# Patient Record
Sex: Female | Born: 1935 | Race: White | Hispanic: No | Smoking: Former smoker
Health system: Southern US, Community
[De-identification: ages and names within clinical notes are randomized; demographics above are authoritative.]

## PROBLEM LIST (undated history)

## (undated) DIAGNOSIS — I6529 Occlusion and stenosis of unspecified carotid artery: Secondary | ICD-10-CM

## (undated) DIAGNOSIS — F0390 Unspecified dementia without behavioral disturbance: Secondary | ICD-10-CM

## (undated) DIAGNOSIS — IMO0002 Reserved for concepts with insufficient information to code with codable children: Secondary | ICD-10-CM

## (undated) DIAGNOSIS — I219 Acute myocardial infarction, unspecified: Secondary | ICD-10-CM

## (undated) DIAGNOSIS — M549 Dorsalgia, unspecified: Secondary | ICD-10-CM

## (undated) DIAGNOSIS — I350 Nonrheumatic aortic (valve) stenosis: Secondary | ICD-10-CM

## (undated) DIAGNOSIS — K311 Adult hypertrophic pyloric stenosis: Secondary | ICD-10-CM

## (undated) DIAGNOSIS — N189 Chronic kidney disease, unspecified: Secondary | ICD-10-CM

## (undated) DIAGNOSIS — N289 Disorder of kidney and ureter, unspecified: Secondary | ICD-10-CM

## (undated) DIAGNOSIS — J449 Chronic obstructive pulmonary disease, unspecified: Secondary | ICD-10-CM

## (undated) DIAGNOSIS — I701 Atherosclerosis of renal artery: Secondary | ICD-10-CM

## (undated) DIAGNOSIS — E785 Hyperlipidemia, unspecified: Secondary | ICD-10-CM

## (undated) DIAGNOSIS — D649 Anemia, unspecified: Secondary | ICD-10-CM

## (undated) DIAGNOSIS — I10 Essential (primary) hypertension: Secondary | ICD-10-CM

## (undated) DIAGNOSIS — I2510 Atherosclerotic heart disease of native coronary artery without angina pectoris: Secondary | ICD-10-CM

## (undated) DIAGNOSIS — K449 Diaphragmatic hernia without obstruction or gangrene: Secondary | ICD-10-CM

## (undated) DIAGNOSIS — E039 Hypothyroidism, unspecified: Secondary | ICD-10-CM

## (undated) DIAGNOSIS — K635 Polyp of colon: Secondary | ICD-10-CM

## (undated) HISTORY — DX: Dorsalgia, unspecified: M54.9

## (undated) HISTORY — DX: Atherosclerotic heart disease of native coronary artery without angina pectoris: I25.10

## (undated) HISTORY — PX: CHOLECYSTECTOMY: SHX55

## (undated) HISTORY — DX: Diaphragmatic hernia without obstruction or gangrene: K44.9

## (undated) HISTORY — DX: Occlusion and stenosis of unspecified carotid artery: I65.29

## (undated) HISTORY — DX: Adult hypertrophic pyloric stenosis: K31.1

## (undated) HISTORY — DX: Chronic obstructive pulmonary disease, unspecified: J44.9

---

## 1998-11-11 ENCOUNTER — Other Ambulatory Visit: Admission: RE | Admit: 1998-11-11 | Discharge: 1998-11-11 | Payer: Self-pay | Admitting: Family Medicine

## 1999-02-14 ENCOUNTER — Ambulatory Visit (HOSPITAL_COMMUNITY): Admission: RE | Admit: 1999-02-14 | Discharge: 1999-02-14 | Payer: Self-pay | Admitting: Family Medicine

## 1999-02-14 ENCOUNTER — Encounter: Payer: Self-pay | Admitting: Family Medicine

## 1999-02-24 ENCOUNTER — Encounter: Payer: Self-pay | Admitting: Family Medicine

## 1999-02-24 ENCOUNTER — Ambulatory Visit (HOSPITAL_COMMUNITY): Admission: RE | Admit: 1999-02-24 | Discharge: 1999-02-24 | Payer: Self-pay | Admitting: Family Medicine

## 1999-08-30 ENCOUNTER — Encounter: Admission: RE | Admit: 1999-08-30 | Discharge: 1999-08-30 | Payer: Self-pay | Admitting: Family Medicine

## 1999-08-30 ENCOUNTER — Encounter: Payer: Self-pay | Admitting: Family Medicine

## 1999-11-15 ENCOUNTER — Other Ambulatory Visit: Admission: RE | Admit: 1999-11-15 | Discharge: 1999-11-15 | Payer: Self-pay | Admitting: Family Medicine

## 2000-06-18 HISTORY — PX: CORONARY ARTERY BYPASS GRAFT: SHX141

## 2000-09-27 ENCOUNTER — Encounter: Payer: Self-pay | Admitting: Family Medicine

## 2000-09-27 ENCOUNTER — Encounter: Admission: RE | Admit: 2000-09-27 | Discharge: 2000-09-27 | Payer: Self-pay | Admitting: Family Medicine

## 2000-12-27 ENCOUNTER — Ambulatory Visit: Admission: RE | Admit: 2000-12-27 | Discharge: 2000-12-27 | Payer: Self-pay | Admitting: Internal Medicine

## 2001-01-04 ENCOUNTER — Encounter: Payer: Self-pay | Admitting: Emergency Medicine

## 2001-01-04 ENCOUNTER — Encounter: Payer: Self-pay | Admitting: Family Medicine

## 2001-01-04 ENCOUNTER — Inpatient Hospital Stay (HOSPITAL_COMMUNITY): Admission: EM | Admit: 2001-01-04 | Discharge: 2001-01-14 | Payer: Self-pay | Admitting: Emergency Medicine

## 2001-01-06 ENCOUNTER — Encounter: Payer: Self-pay | Admitting: Internal Medicine

## 2001-01-08 ENCOUNTER — Encounter: Payer: Self-pay | Admitting: *Deleted

## 2001-01-09 ENCOUNTER — Encounter: Payer: Self-pay | Admitting: Thoracic Surgery (Cardiothoracic Vascular Surgery)

## 2001-01-10 ENCOUNTER — Encounter: Payer: Self-pay | Admitting: Thoracic Surgery (Cardiothoracic Vascular Surgery)

## 2001-01-11 ENCOUNTER — Encounter: Payer: Self-pay | Admitting: Thoracic Surgery (Cardiothoracic Vascular Surgery)

## 2001-09-17 ENCOUNTER — Encounter: Admission: RE | Admit: 2001-09-17 | Discharge: 2001-09-17 | Payer: Self-pay | Admitting: Family Medicine

## 2001-09-17 ENCOUNTER — Encounter: Payer: Self-pay | Admitting: Family Medicine

## 2001-11-14 ENCOUNTER — Other Ambulatory Visit: Admission: RE | Admit: 2001-11-14 | Discharge: 2001-11-14 | Payer: Self-pay | Admitting: Family Medicine

## 2002-08-21 ENCOUNTER — Encounter: Payer: Self-pay | Admitting: Cardiology

## 2002-08-21 ENCOUNTER — Ambulatory Visit (HOSPITAL_COMMUNITY): Admission: RE | Admit: 2002-08-21 | Discharge: 2002-08-21 | Payer: Self-pay | Admitting: Cardiology

## 2002-10-13 ENCOUNTER — Encounter: Payer: Self-pay | Admitting: Family Medicine

## 2002-10-13 ENCOUNTER — Encounter: Admission: RE | Admit: 2002-10-13 | Discharge: 2002-10-13 | Payer: Self-pay | Admitting: Family Medicine

## 2003-10-14 ENCOUNTER — Encounter: Admission: RE | Admit: 2003-10-14 | Discharge: 2003-10-14 | Payer: Self-pay | Admitting: Family Medicine

## 2004-05-03 ENCOUNTER — Ambulatory Visit: Payer: Self-pay | Admitting: Family Medicine

## 2004-05-18 ENCOUNTER — Encounter: Admission: RE | Admit: 2004-05-18 | Discharge: 2004-05-18 | Payer: Self-pay | Admitting: Family Medicine

## 2004-05-29 ENCOUNTER — Ambulatory Visit: Payer: Self-pay | Admitting: Gastroenterology

## 2004-06-05 ENCOUNTER — Ambulatory Visit: Payer: Self-pay | Admitting: Gastroenterology

## 2004-06-05 ENCOUNTER — Ambulatory Visit (HOSPITAL_COMMUNITY): Admission: RE | Admit: 2004-06-05 | Discharge: 2004-06-05 | Payer: Self-pay | Admitting: Gastroenterology

## 2004-07-03 ENCOUNTER — Ambulatory Visit: Payer: Self-pay | Admitting: Gastroenterology

## 2004-07-11 ENCOUNTER — Ambulatory Visit (HOSPITAL_COMMUNITY): Admission: RE | Admit: 2004-07-11 | Discharge: 2004-07-11 | Payer: Self-pay | Admitting: Gastroenterology

## 2004-07-11 ENCOUNTER — Ambulatory Visit: Payer: Self-pay | Admitting: Gastroenterology

## 2004-07-12 ENCOUNTER — Ambulatory Visit (HOSPITAL_COMMUNITY): Admission: RE | Admit: 2004-07-12 | Discharge: 2004-07-12 | Payer: Self-pay | Admitting: Gastroenterology

## 2004-08-09 ENCOUNTER — Ambulatory Visit: Payer: Self-pay | Admitting: Gastroenterology

## 2004-11-01 ENCOUNTER — Encounter: Admission: RE | Admit: 2004-11-01 | Discharge: 2004-11-01 | Payer: Self-pay | Admitting: Family Medicine

## 2004-12-21 ENCOUNTER — Encounter: Admission: RE | Admit: 2004-12-21 | Discharge: 2004-12-21 | Payer: Self-pay | Admitting: Family Medicine

## 2004-12-21 ENCOUNTER — Ambulatory Visit: Payer: Self-pay | Admitting: Family Medicine

## 2005-01-01 ENCOUNTER — Ambulatory Visit (HOSPITAL_COMMUNITY): Admission: RE | Admit: 2005-01-01 | Discharge: 2005-01-02 | Payer: Self-pay | Admitting: Family Medicine

## 2005-01-05 ENCOUNTER — Ambulatory Visit: Payer: Self-pay | Admitting: Family Medicine

## 2005-01-26 ENCOUNTER — Ambulatory Visit: Payer: Self-pay | Admitting: Family Medicine

## 2005-03-08 ENCOUNTER — Ambulatory Visit: Payer: Self-pay | Admitting: Family Medicine

## 2005-04-21 ENCOUNTER — Ambulatory Visit: Payer: Self-pay | Admitting: Family Medicine

## 2005-07-30 ENCOUNTER — Ambulatory Visit: Payer: Self-pay | Admitting: Family Medicine

## 2005-08-27 ENCOUNTER — Ambulatory Visit (HOSPITAL_COMMUNITY): Admission: RE | Admit: 2005-08-27 | Discharge: 2005-08-27 | Payer: Self-pay | Admitting: Family Medicine

## 2005-08-30 ENCOUNTER — Ambulatory Visit: Payer: Self-pay | Admitting: Family Medicine

## 2005-09-12 ENCOUNTER — Encounter: Admission: RE | Admit: 2005-09-12 | Discharge: 2005-09-12 | Payer: Self-pay | Admitting: Interventional Radiology

## 2005-09-27 ENCOUNTER — Ambulatory Visit: Payer: Self-pay | Admitting: Family Medicine

## 2005-10-11 ENCOUNTER — Ambulatory Visit: Payer: Self-pay | Admitting: Family Medicine

## 2005-11-05 ENCOUNTER — Encounter: Admission: RE | Admit: 2005-11-05 | Discharge: 2005-11-05 | Payer: Self-pay | Admitting: Family Medicine

## 2006-01-08 ENCOUNTER — Ambulatory Visit: Payer: Self-pay | Admitting: Family Medicine

## 2006-02-11 ENCOUNTER — Ambulatory Visit: Payer: Self-pay | Admitting: Family Medicine

## 2006-04-12 ENCOUNTER — Ambulatory Visit: Payer: Self-pay | Admitting: Family Medicine

## 2006-06-27 ENCOUNTER — Ambulatory Visit: Payer: Self-pay | Admitting: Family Medicine

## 2006-07-08 ENCOUNTER — Ambulatory Visit: Payer: Self-pay | Admitting: Family Medicine

## 2006-07-08 LAB — CONVERTED CEMR LAB
BUN: 19 mg/dL (ref 6–23)
Potassium: 4.4 meq/L (ref 3.5–5.1)
Sodium: 145 meq/L (ref 135–145)

## 2006-07-22 ENCOUNTER — Ambulatory Visit: Payer: Self-pay | Admitting: Family Medicine

## 2006-08-21 ENCOUNTER — Ambulatory Visit: Payer: Self-pay | Admitting: Family Medicine

## 2006-10-02 ENCOUNTER — Ambulatory Visit: Payer: Self-pay | Admitting: Family Medicine

## 2006-12-03 ENCOUNTER — Encounter: Admission: RE | Admit: 2006-12-03 | Discharge: 2006-12-03 | Payer: Self-pay | Admitting: Family Medicine

## 2006-12-12 ENCOUNTER — Encounter: Payer: Self-pay | Admitting: Family Medicine

## 2006-12-12 DIAGNOSIS — J45909 Unspecified asthma, uncomplicated: Secondary | ICD-10-CM | POA: Insufficient documentation

## 2006-12-12 DIAGNOSIS — K219 Gastro-esophageal reflux disease without esophagitis: Secondary | ICD-10-CM

## 2006-12-12 DIAGNOSIS — J4489 Other specified chronic obstructive pulmonary disease: Secondary | ICD-10-CM | POA: Insufficient documentation

## 2006-12-12 DIAGNOSIS — J449 Chronic obstructive pulmonary disease, unspecified: Secondary | ICD-10-CM

## 2006-12-12 DIAGNOSIS — E785 Hyperlipidemia, unspecified: Secondary | ICD-10-CM

## 2006-12-12 DIAGNOSIS — I10 Essential (primary) hypertension: Secondary | ICD-10-CM

## 2006-12-12 DIAGNOSIS — I2510 Atherosclerotic heart disease of native coronary artery without angina pectoris: Secondary | ICD-10-CM | POA: Insufficient documentation

## 2006-12-12 DIAGNOSIS — E039 Hypothyroidism, unspecified: Secondary | ICD-10-CM

## 2006-12-23 ENCOUNTER — Telehealth: Payer: Self-pay | Admitting: Family Medicine

## 2007-01-15 ENCOUNTER — Ambulatory Visit: Payer: Self-pay | Admitting: Family Medicine

## 2007-01-15 DIAGNOSIS — M818 Other osteoporosis without current pathological fracture: Secondary | ICD-10-CM | POA: Insufficient documentation

## 2007-01-15 LAB — CONVERTED CEMR LAB
ALT: 16 units/L (ref 0–35)
AST: 22 units/L (ref 0–37)
Basophils Relative: 0.5 % (ref 0.0–1.0)
Bilirubin, Direct: 0.1 mg/dL (ref 0.0–0.3)
CO2: 30 meq/L (ref 19–32)
Calcium: 10.2 mg/dL (ref 8.4–10.5)
Chloride: 101 meq/L (ref 96–112)
Creatinine, Ser: 1.8 mg/dL — ABNORMAL HIGH (ref 0.4–1.2)
Direct LDL: 141.5 mg/dL
Eosinophils Absolute: 0.1 10*3/uL (ref 0.0–0.6)
Eosinophils Relative: 2.3 % (ref 0.0–5.0)
Glucose, Bld: 104 mg/dL — ABNORMAL HIGH (ref 70–99)
HCT: 40.6 % (ref 36.0–46.0)
HDL: 54.5 mg/dL (ref >39.0)
MCV: 87.9 fL (ref 78.0–100.0)
Neutrophils Relative %: 67.1 % (ref 43.0–77.0)
Platelets: 202 10*3/uL (ref 150–400)
RBC: 4.62 M/uL (ref 3.87–5.11)
Sodium: 140 meq/L (ref 135–145)
Total Bilirubin: 1 mg/dL (ref 0.3–1.2)
Total Protein: 7.1 g/dL (ref 6.0–8.3)
Triglycerides: 93 mg/dL (ref 0–149)
VLDL: 19 mg/dL (ref 0–40)
WBC: 5.8 10*3/uL (ref 4.5–10.5)

## 2007-04-18 ENCOUNTER — Ambulatory Visit: Payer: Self-pay | Admitting: Family Medicine

## 2007-12-04 ENCOUNTER — Encounter: Admission: RE | Admit: 2007-12-04 | Discharge: 2007-12-04 | Payer: Self-pay | Admitting: Family Medicine

## 2008-01-14 ENCOUNTER — Telehealth: Payer: Self-pay | Admitting: Family Medicine

## 2008-01-19 ENCOUNTER — Ambulatory Visit (HOSPITAL_COMMUNITY): Admission: RE | Admit: 2008-01-19 | Discharge: 2008-01-19 | Payer: Self-pay | Admitting: Family Medicine

## 2008-01-19 ENCOUNTER — Ambulatory Visit: Payer: Self-pay | Admitting: Family Medicine

## 2008-01-19 LAB — CONVERTED CEMR LAB
ALT: 13 units/L (ref 0–35)
AST: 19 units/L (ref 0–37)
Basophils Absolute: 0 10*3/uL (ref 0.0–0.1)
Basophils Relative: 0.8 % (ref 0.0–3.0)
Bilirubin Urine: NEGATIVE
Bilirubin, Direct: 0.1 mg/dL (ref 0.0–0.3)
CO2: 30 meq/L (ref 19–32)
Chloride: 107 meq/L (ref 96–112)
Cholesterol: 191 mg/dL (ref 0–200)
Glucose, Bld: 93 mg/dL (ref 70–99)
Hemoglobin: 12.6 g/dL (ref 12.0–15.0)
LDL Cholesterol: 119 mg/dL — ABNORMAL HIGH (ref 0–99)
Lymphocytes Relative: 27.2 % (ref 12.0–46.0)
MCHC: 34.9 g/dL (ref 30.0–36.0)
Monocytes Relative: 9.2 % (ref 3.0–12.0)
Neutrophils Relative %: 56.9 % (ref 43.0–77.0)
Nitrite: NEGATIVE
Protein, U semiquant: NEGATIVE
RBC: 4.24 M/uL (ref 3.87–5.11)
RDW: 13.5 % (ref 11.5–14.6)
Sodium: 142 meq/L (ref 135–145)
Total Bilirubin: 0.8 mg/dL (ref 0.3–1.2)
Total CHOL/HDL Ratio: 4
Total Protein: 6.7 g/dL (ref 6.0–8.3)
Urobilinogen, UA: 0.2
VLDL: 25 mg/dL (ref 0–40)

## 2008-04-01 ENCOUNTER — Telehealth: Payer: Self-pay | Admitting: Family Medicine

## 2008-12-06 ENCOUNTER — Encounter: Admission: RE | Admit: 2008-12-06 | Discharge: 2008-12-06 | Payer: Self-pay | Admitting: Family Medicine

## 2009-05-17 ENCOUNTER — Telehealth: Payer: Self-pay | Admitting: Family Medicine

## 2009-06-18 HISTORY — PX: CAROTID ENDARTERECTOMY: SUR193

## 2009-09-02 ENCOUNTER — Observation Stay (HOSPITAL_COMMUNITY): Admission: EM | Admit: 2009-09-02 | Discharge: 2009-09-06 | Payer: Self-pay | Admitting: Anesthesiology

## 2009-09-04 ENCOUNTER — Ambulatory Visit: Payer: Self-pay | Admitting: Internal Medicine

## 2009-09-05 ENCOUNTER — Ambulatory Visit: Payer: Self-pay | Admitting: Vascular Surgery

## 2009-09-05 ENCOUNTER — Encounter (INDEPENDENT_AMBULATORY_CARE_PROVIDER_SITE_OTHER): Payer: Self-pay | Admitting: Internal Medicine

## 2009-09-06 ENCOUNTER — Encounter (INDEPENDENT_AMBULATORY_CARE_PROVIDER_SITE_OTHER): Payer: Self-pay | Admitting: Internal Medicine

## 2009-09-08 ENCOUNTER — Inpatient Hospital Stay (HOSPITAL_COMMUNITY): Admission: RE | Admit: 2009-09-08 | Discharge: 2009-09-09 | Payer: Self-pay | Admitting: Vascular Surgery

## 2009-09-08 ENCOUNTER — Encounter: Payer: Self-pay | Admitting: Vascular Surgery

## 2009-09-28 ENCOUNTER — Ambulatory Visit: Payer: Self-pay | Admitting: Vascular Surgery

## 2010-09-08 LAB — PROTIME-INR
INR: 0.98 (ref 0.00–1.49)
Prothrombin Time: 12.9 seconds (ref 11.6–15.2)

## 2010-09-08 LAB — BASIC METABOLIC PANEL
BUN: 13 mg/dL (ref 6–23)
BUN: 22 mg/dL (ref 6–23)
CO2: 22 mEq/L (ref 19–32)
CO2: 24 mEq/L (ref 19–32)
CO2: 24 mEq/L (ref 19–32)
CO2: 26 mEq/L (ref 19–32)
Calcium: 8.8 mg/dL (ref 8.4–10.5)
Calcium: 8.9 mg/dL (ref 8.4–10.5)
Chloride: 105 mEq/L (ref 96–112)
Chloride: 105 mEq/L (ref 96–112)
Chloride: 109 mEq/L (ref 96–112)
Creatinine, Ser: 1.09 mg/dL (ref 0.4–1.2)
Creatinine, Ser: 1.37 mg/dL — ABNORMAL HIGH (ref 0.4–1.2)
GFR calc Af Amer: 57 mL/min — ABNORMAL LOW (ref >60)
GFR calc Af Amer: >60 mL/min (ref >60)
GFR calc non Af Amer: 48 mL/min — ABNORMAL LOW (ref >60)
Glucose, Bld: 107 mg/dL — ABNORMAL HIGH (ref 70–99)
Glucose, Bld: 141 mg/dL — ABNORMAL HIGH (ref 70–99)
Glucose, Bld: 87 mg/dL (ref 70–99)
Glucose, Bld: 92 mg/dL (ref 70–99)
Sodium: 138 mEq/L (ref 135–145)
Sodium: 139 mEq/L (ref 135–145)
Sodium: 139 mEq/L (ref 135–145)

## 2010-09-08 LAB — COMPREHENSIVE METABOLIC PANEL
ALT: <8 U/L (ref 0–35)
Albumin: 3.2 g/dL — ABNORMAL LOW (ref 3.5–5.2)
Alkaline Phosphatase: 53 U/L (ref 39–117)
BUN: 14 mg/dL (ref 6–23)
Calcium: 8.8 mg/dL (ref 8.4–10.5)
Potassium: 3.4 mEq/L — ABNORMAL LOW (ref 3.5–5.1)
Sodium: 136 mEq/L (ref 135–145)
Total Protein: 5.9 g/dL — ABNORMAL LOW (ref 6.0–8.3)

## 2010-09-08 LAB — VITAMIN B12: Vitamin B-12: 348 pg/mL (ref 211–911)

## 2010-09-08 LAB — CBC
HCT: 25 % — ABNORMAL LOW (ref 36.0–46.0)
HCT: 30.2 % — ABNORMAL LOW (ref 36.0–46.0)
Hemoglobin: 11.3 g/dL — ABNORMAL LOW (ref 12.0–15.0)
Hemoglobin: 7.9 g/dL — ABNORMAL LOW (ref 12.0–15.0)
Hemoglobin: 9.6 g/dL — ABNORMAL LOW (ref 12.0–15.0)
Hemoglobin: 9.7 g/dL — ABNORMAL LOW (ref 12.0–15.0)
MCHC: 31.2 g/dL (ref 30.0–36.0)
MCHC: 31.8 g/dL (ref 30.0–36.0)
MCHC: 31.9 g/dL (ref 30.0–36.0)
MCHC: 32 g/dL (ref 30.0–36.0)
MCHC: 32.1 g/dL (ref 30.0–36.0)
MCHC: 32.4 g/dL (ref 30.0–36.0)
MCV: 63.6 fL — ABNORMAL LOW (ref 78.0–100.0)
MCV: 69.1 fL — ABNORMAL LOW (ref 78.0–100.0)
MCV: 69.6 fL — ABNORMAL LOW (ref 78.0–100.0)
Platelets: 184 10*3/uL (ref 150–400)
Platelets: 196 10*3/uL (ref 150–400)
Platelets: 247 10*3/uL (ref 150–400)
RBC: 3.59 MIL/uL — ABNORMAL LOW (ref 3.87–5.11)
RBC: 4.38 MIL/uL (ref 3.87–5.11)
RBC: 5.07 MIL/uL (ref 3.87–5.11)
RDW: 20.4 % — ABNORMAL HIGH (ref 11.5–15.5)
RDW: 25.2 % — ABNORMAL HIGH (ref 11.5–15.5)
RDW: 25.4 % — ABNORMAL HIGH (ref 11.5–15.5)
RDW: 26.2 % — ABNORMAL HIGH (ref 11.5–15.5)

## 2010-09-08 LAB — POCT CARDIAC MARKERS: Myoglobin, poc: 89.4 ng/mL (ref 12–200)

## 2010-09-08 LAB — CARDIAC PANEL(CRET KIN+CKTOT+MB+TROPI)
CK, MB: 1.3 ng/mL (ref 0.3–4.0)
Total CK: 104 U/L (ref 7–177)
Total CK: 89 U/L (ref 7–177)
Troponin I: 0.02 ng/mL (ref 0.00–0.06)

## 2010-09-08 LAB — CK TOTAL AND CKMB (NOT AT ARMC)
CK, MB: 1.1 ng/mL (ref 0.3–4.0)
Relative Index: INVALID (ref 0.0–2.5)
Total CK: 94 U/L (ref 7–177)

## 2010-09-08 LAB — URINALYSIS, ROUTINE W REFLEX MICROSCOPIC
Glucose, UA: NEGATIVE mg/dL
Hgb urine dipstick: NEGATIVE
Ketones, ur: NEGATIVE mg/dL
Protein, ur: NEGATIVE mg/dL

## 2010-09-08 LAB — RETICULOCYTES
RBC.: 4.1 MIL/uL (ref 3.87–5.11)
Retic Count, Absolute: 28.7 10*3/uL (ref 19.0–186.0)
Retic Ct Pct: 0.7 % (ref 0.4–3.1)

## 2010-09-08 LAB — CROSSMATCH
ABO/RH(D): O POS
DAT, IgG: NEGATIVE
Donor AG Type: NEGATIVE

## 2010-09-08 LAB — T4, FREE: Free T4: 1.14 ng/dL (ref 0.80–1.80)

## 2010-09-08 LAB — LIPID PANEL
Cholesterol: 155 mg/dL (ref 0–200)
HDL: 64 mg/dL (ref >39)
Total CHOL/HDL Ratio: 2.4 RATIO
Triglycerides: 74 mg/dL (ref <150)

## 2010-09-08 LAB — TYPE AND SCREEN
ABO/RH(D): O POS
Antibody Screen: POSITIVE

## 2010-09-08 LAB — BLOOD GAS, ARTERIAL
Acid-Base Excess: 1.1 mmol/L (ref 0.0–2.0)
O2 Saturation: 97.3 %
TCO2: 25.7 mmol/L (ref 0–100)
pCO2 arterial: 35.1 mmHg (ref 35.0–45.0)
pO2, Arterial: 79.6 mmHg — ABNORMAL LOW (ref 80.0–100.0)

## 2010-09-08 LAB — HEMOGLOBIN A1C
Hgb A1c MFr Bld: 6.2 % — ABNORMAL HIGH (ref 4.6–6.1)
Mean Plasma Glucose: 131 mg/dL

## 2010-09-08 LAB — MAGNESIUM: Magnesium: 2.1 mg/dL (ref 1.5–2.5)

## 2010-09-08 LAB — TSH: TSH: 21.422 u[IU]/mL — ABNORMAL HIGH (ref 0.350–4.500)

## 2010-09-08 LAB — IRON AND TIBC

## 2010-09-08 LAB — URINE MICROSCOPIC-ADD ON

## 2010-09-08 LAB — HEMOCCULT GUIAC POC 1CARD (OFFICE): Fecal Occult Bld: NEGATIVE

## 2010-10-31 NOTE — Assessment & Plan Note (Signed)
OFFICE VISIT   OLUWASEMILORE, PASCUZZI  DOB:  23-May-1929                                       09/28/2009  ZOXWR#:[id number]   Ms. Sedam returns for followup today.  She underwent left carotid  endarterectomy on September 08, 2009.  This required resection of some  redundant portion of the internal carotid artery as well as ligation of  her left internal external carotid artery.  She had an uneventful  postoperative recovery.  She returns for further followup today.  She  denies any symptoms of TIA, amaurosis or stroke.  She states that she is  still undergoing workup for her chronic anemia by Dr. Loreta Ave.   On physical exam today, blood pressure is 134/73 in the right arm,  136/73 in the left arm, heart rate 60 and regular.  Left neck incision  is well-healed.  She has no carotid bruits bilaterally.  Upper extremity  and lower extremity motor strength is 5/5 and symmetric.   Previously she had a moderate 40-60% stenosis of her right carotid  artery.  She will return for further followup for surveillance of this  in 6 months time as well as to reevaluate the left side to make sure she  has no recurrent stenosis.  She will continue to take her aspirin 81 mg  once daily.     Janetta Hora. Fields, MD  Electronically Signed   CEF/MEDQ  D:  09/28/2009  T:  09/29/2009  Job:  [id number]   cc:   Levander Campion, M.D.  Anselmo Rod, M.D.

## 2010-11-03 NOTE — Op Note (Signed)
[place]  Patient:    Wanda Robinson, Wanda Robinson                        MRN: [id number] Proc. Date: 01/09/01 Adm. Date:  [id number] Attending:  Veneda Melter CC:         Lewayne Bunting, M.D.  Evette Georges, M.D. [facility]   Operative Report  PREOPERATIVE DIAGNOSIS:  Severe 3-vessel coronary disease with unstable angina.  POSTOPERATIVE DIAGNOSIS:   Severe 3-vessel coronary disease with unstable angina.  PROCEDURE:  Median sternotomy, extracorporeal circulation, coronary artery bypass grafting x 4 (left internal mammary artery to left anterior descending, saphenous vein graft to ramus intermedius, saphenous vein graft to obtuse marginal-one, saphenous vein graft to posterior descending).  SURGEON:  Salvatore Decent. Dorris Fetch, M.D.  ASSISTANT:  Loura Pardon, P.A.  ANESTHESIA:  General.  FINDINGS:  PDA - poor quality target.  LAD - 1 mm, fair quality target.  Ramus intermedius and OM-1 1.5 mm - thin-walled, fair quality targets.  Good quality conduits.  Left ventricular hypertrophy, inferior scar.  CLINICAL NOTE:  [name] is a 77 year old white female with a history of coronary disease dating back to a myocardial infarction in 1980.  She had had no recent problems with her heart, but presented with unstable angina.  She underwent cardiac catheterization, which revealed severe 3-vessel disease with total occlusion of her circumflex, right coronary and LAD.  She had excellent filling through collaterals primarily supplied by a diseased ramus intermedius.  She also had an 80% left main stenosis.  Her ejection fraction was approximately 50%.  She was referred for coronary artery bypass grafting. The indications, risks, benefits and alternatives were discussed in detail. She understood and accepted the risks and agreed to proceed.  OPERATIVE NOTE:  [name] was brought to the preoperative holding area on January 09, 2001.  Lines were placed to monitor arterial,  central venous  and pulmonary arterial pressure.  EKG leads were placed for continuous telemetry. The patient was taken to the operating room, anesthetized and intubated.  A Foley catheter was placed, intravenous antibiotics were administered.  The chest, abdomen and legs were prepped and draped in the usual fashion.  A median sternotomy was performed.  Simultaneously, an incision was made in the medial aspect of the right leg and the greater saphenous vein was harvested from the ankle to the mid thigh.  The left internal mammary artery was harvested in the standard fashion.  Large branches were doubly clipped and divided.  Small branches were clipped on the mammary side and cauterized on the chest wall side.  The patient was given a full dose of heparin prior to dividing the distal end of the mammary artery.  There was excellent flow through the cut end of the mammary artery.  The mammary was placed in a papaverine soaked sponge and placed into the left pleural space.  The pericardium was opened.  The ascending aorta was inspected and palpated. There was no palpable atherosclerotic disease.  The aorta was cannulated via concentric 2-0 Ethibond pledgeted purse-string sutures.  A dual-stage venous cannula was placed via purse-string suture in the right atrial appendage. Cardiopulmonary bypass was instituted and the patient was cooled to 32 degrees Celcius.  The coronary arteries were inspected and anastomotic sites were chosen.  The conduits were inspected and cut to length.  A foam pad was placed in the pericardium to protect the left phrenic nerve.  A temperature probe  was placed in the myocardial septum and a cardioplegia cannula was placed in the ascending aorta.  The aorta was cross-clamped.  The left ventricle was emptied via the aortic root vent.  Cardiac arrest then was achieved with a combination of cold antegrade blood cardioplegia and topical ice saline; 900 cc of  cardioplegia was administered.  There was a rapid diastolic arrest and myocardial septal temperature of 11 degrees Celcius.  The following distal anastomoses were performed.  First, a reversed saphenous vein graft was placed end-to-side to the posterior descending branch of the distal right coronary.  The posterior descending was a 1 mm, diffusely diseased vessel.  The distal right coronary was heavily calcified and not graftable at that site.  The vein graft was of good quality. The anastomosis was performed with a running 7-0 Prolene suture in an end-to-side fashion.  Next, a reversed saphenous vein graft was placed end-to-side to the first obtuse marginal branch of the left circumflex coronary artery.  This was a posterolateral branch that bifurcated.  The superior limb was the larger of the two limbs and the graft was placed into that.  The circumflex was totally occluded proximal to this vessel.  The vein graft was of good quality.  The artery was fair quality.  It was thin-walled and 1.5 mm in diameter.  The anastomosis was performed with a running 7-0 Prolene suture.  At the completion of the anastomosis, it was probed proximally and distally to ensure patency.  Cardioplegia was administered.  There was good flow and good hemostasis.  Next, a reversed saphenous vein graft was placed end-to-side to the ramus intermedius.  This had an 80% proximal stenosis.  It again was a bifurcating vessel.  The superior limb was the larger of the two branches and a saphenous vein graft was placed end-to-side to this portion of the ramus intermedius with a running 7-0 Prolene suture.  The vein graft was of good quality.  The ramus was 1.5 mm and fair quality as it also was very thin-walled. Cardioplegia then was administered down all three vein grafts.  There was good flow and good hemostasis.  Next, the left internal mammary artery was brought through a window in the  pericardium anterior to  the left phrenic nerve.  The distal end of the mammary was spatulated.  It was a 2 mm, good quality conduit with excellent flow.  It was anastomosed end-to-side to the distal LAD.  The LAD was totally occluded. It did have a diagonal branch, which was less than a mm and too small to graft.  The LAD itself had a 1 mm lumen.  A probe would not pass proximally, but did pass distally to the apex.  The anastomosis was performed end-to-side with a running 8-0 Prolene suture.  At the completion of the mammary to LAD anastomosis, the bulldog clamp was removed from the mammary artery.  Immediate and rapid septal rewarming was noted.  There was a small adventitial leak near the toe, which was repaired with a single 8-0 Prolene suture.  The mammary pedicle was tacked to the epicardial surface of the heart with 6-0 Prolene sutures.  Lidocaine was administered.  There was immediate and rapid septal rewarming.  The aortic cross-clamp was removed.  Total cross-clamp time was 66 minutes.  A partial occlusion clamp was placed on the ascending aorta.  The cardioplegia cannula was removed.  The vein grafts were cut to length and the proximal vein graft anastomoses were performed to 4.0  mm punch aortotomies with a running 6-0 Prolene sutures.  At the completion of the final proximal anastomosis, the patient was placed in Trendelenburg position, air was allowed to vent as the partial clamp was removed.  Air was then aspirated from each of the vein grafts.  The bulldog clamps were removed and flow was restored through all vein grafts.  There was a leak from the toe of the OM-1 proximal, which was repaired with a single 6-0 Prolene suture and a leak from heel of the PDA proximal, which again was repaired with a single 6-0 Prolene suture.  All proximal and distal anastomoses were inspected for hemostasis.  Epicardial pacing wires were placed on the right ventricle and right atrium.   Rewarming had been started  during the mammary to LAD anastomosis and when the core temperature reached 37 degrees Celcius, the patient was weaned from cardiopulmonary bypass without difficulty.  The total bypass time was 131 minutes.  The patient weaned from bypass without inotropic support.  Her initial cardiac indexes were greater than 3 L/min/m.sq.  Of note, she did require 4 units of packed red blood cell transfusion due anemia while on bypass.  A test dose of protamine was administered and was well-tolerated.  The atrial and aortic cannulae were removed.  There was good hemostasis at both cannulation sites.  The remainder of the protamine was administered without incident.  The chest was irrigated with 1 L of warm normal saline containing 1 g of vancomycin.  Hemostasis was achieved.  The pericardium could not be reapproximated.  The mediastinal fat was brought together over the aorta and proximal vein graft anastomoses.  A left pleural and two mediastinal chest tubes were placed through separate subcostal incisions.  The sternum was closed with heavy gauge, interrupted stainless steel wires.  The pectoralis fascia was closed with a running #1 Vicryl suture.  The subcutaneous tissue was closed a running 2-0 Vicryl subcutaneous suture and the skin was closed with a 3-0 Vicryl subcuticular suture.  The leg incision was closed with a 2-0 Vicryl subcutaneous suture and skin staples.  All sponge, needle and instrument counts were correct at the end of the procedure.  The patient remained hemodynamically stable and was taken from the operating room to the surgical intensive care unit intubated and in stable condition. DD:  01/09/01 TD:  01/09/01 Job: [id number]

## 2010-11-03 NOTE — Discharge Summary (Signed)
[place]. [facility]  Patient:    Wanda Robinson, Wanda Robinson                        MRN: [id number] Adm. Date:  [id number] Disc. Date: [id number] Attending:  Charlett Lango Dictator:   Maxwell Marion, RNFA CC:         Lewayne Bunting, M.D.  Evette Georges, M.D. [facility]   Discharge Summary  DATE OF BIRTH:  Sep 04, 1928.  ADMISSION DIAGNOSIS:  Unstable angina.  PAST MEDICAL HISTORY: 1. Coronary artery disease, history of myocardial infarction in 1980,    treated medically. 2. Elevated cholesterol. 3. Hypothyroidism. 4. Hypertension. 5. History of renal artery stenosis treated with percutaneous transluminal    angioplasty and stent September of 2000. 6. Osteoporosis. 7. Transient elevated blood sugar.  ALLERGIES:  This patient is allergic to CODEINE.  DISCHARGE DIAGNOSES: 1. Severe three vessel coronary artery disease with unstable angina, status    post coronary artery bypass grafting. 2. Postoperative bleeding, status post re-exploration of the mediastinum. 3. Postoperative volume overload, resolving.  HOSPITAL COURSE AND PROCEDURES:  Wanda Robinson is a 77 year old white female who was admitted on January 04, 2001, to [facility] in care of [facility] with complaints of substernal chest pain at rest. With her history of myocardial infarction and no follow-up since 1980, it was decided that she should have a cardiac catheterization. This was performed on January 07, 2001, and revealed severe three vessel disease with total occlusion of her circumflex, right coronary, and LAD. She also had an 80% left main stenosis.  Her ejection fraction was approximately 50%.  On January 08, 2001, cardiac surgery consult was obtained with Viviann Spare C. Dorris Fetch, M.D.  He recommended coronary artery bypass grafting for treatment of choice for this woman.  Also on January 08, 2001, she underwent preoperative Doppler studies. This revealed no significant  coronary artery disease and her ankle brachial indexes were noted to be normal bilaterally. On January 09, 2001, she underwent an uncomplicated coronary artery bypass grafting x 4 with Dr. Charlett Lango. Grafts placed at the time of procedure were left internal mammary artery to the left anterior descending artery, saphenous vein was grafted to the ramus intermedius, saphenous vein was grafted to the first obtuse marginal, and saphenous vein was grafted to the posterior descending coronary artery.  She was transferred in stable condition to the SICU.  Wanda Robinson had significant bleeding in the immediate postoperative period.  Her coagulation studies were not very abnormal and because of this, it was decided to bring her back to the operating room for re-exploration of her mediastinum. Findings at the time of surgery were some multiple bleeding sites on the posterior table of the sternum as well as oozing from around the sternal wires but no definitive bleeding sites responsible for large volume.  Next, the internal mammary artery harvest site was also inspected. There were multiple areas where there was capillary oozing but no significant arterial or venous bleeding. The remainder of the chest cavity was examined closely. There was no evidence of bleeding.  The incision was closed and she was again transferred to the SICU in stable condition.  The remainder of Wanda Robinson postoperative course has been uneventful and she is making very good progress in recovery from her surgery.  If she remains stable, it is anticipated she will be ready to be discharged home tomorrow, January 14, 2001.  CONDITION ON  DISCHARGE:  Improved.  DISCHARGE INSTRUCTIONS:  These include medications, activity, diet, wound care and follow-up appointments.  Please see the discharge instruction sheet for details.  MEDICATIONS ON DISCHARGE: 1. Enteric coated aspirin 325 mg p.o. q.d. 2. Darvocet-N 100 one to two p.o.  q.4-6h. p.r.n. for pain. 3. Toprol XL 25 mg p.o. q.d. 4. Lasix 40 mg p.o. q.d. 5. Potassium chloride 20 mEq p.o. q.d. 6. She has also been instructed to resume these home medications:    a. Synthroid 0.125 mg p.o. q.d.    b. Calcium 500 mg p.o. t.i.d.    c. Vitamin D 800 mg p.o. q.d.    d. Protonix 40 mg p.o. q.d.    e. Mevacor 20 mg p.o. q.d.  FOLLOW-UP:  Wanda Robinson has an appointment at the [facility] office on Thursday, January 23, 2001, for staple removal.  She also has an appointment at Lewayne Bunting, M.D.s, office on January 27, 2001, at 3:30 p.m. She will have a chest x-ray taken at that time. She also has an appointment to see Dr. Dorris Fetch back in the [facility] office on Wednesday, February 05, 2001, at 10:30. DD:  01/13/01 TD:  01/14/01 Job: [id number]

## 2010-11-03 NOTE — Cardiovascular Report (Signed)
[place]. [facility]  Patient:    Wanda Robinson, Wanda Robinson                        MRN: [id number] Proc. Date: 01/07/01 Adm. Date:  [id number] Attending:  Veneda Melter CC:         Evette Georges, M.D. [facility]  Lewayne Bunting, M.D.  Cardiac Catheterization Laboratory   Cardiac Catheterization  PROCEDURES PERFORMED:  Left heart catheterization with coronary angiography and left ventriculography.  INDICATIONS:  [name] is a 77 year old woman with a history of coronary artery disease and previous myocardial infarction.  She is admitted to the hospital with chest pain and borderline elevation of her troponin level.  PROCEDURAL NOTE:  A 6-French sheath was placed in the right femoral artery. Standard Judkins 6-French catheters were utilized.  Contrast was Omnipaque. There were no complications.  RESULTS:  HEMODYNAMICS:  Left ventricular pressure 122/20.  Aortic pressure 122/58. There is no aortic valve gradient.  LEFT VENTRICULOGRAM:  There is moderate hypokinesis of the anterior apical wall and interior apical wall with focal akinesis of the apical wall. Ejection fraction is estimated at 50%.  There is no mitral regurgitation.  ABDOMINAL AORTOGRAM:  Abdominal aortogram reveals a patent stent in the left renal artery with less than 20% stenosis within the stent.  There are two right renal arteries, the inferior of which is slender and has a 90% stenosis at its ostium.  The more superior right renal artery is patent.  There is a moderate sized saccular infrarenal abdominal aortic aneurysm.  The iliac arteries have only minor irregularities.  LEFT SUBCLAVIAN ARTERY:  The left subclavian artery has what appears to be a 30% stenosis proximally.  CORONARY ARTERIOGRAPHY (RIGHT DOMINANT): The left main has a distal 80% stenosis just proximal to its bifurcation.  The left anterior descending artery is 100% occluded in the mid vessel just after the first diagonal  branch.  There were left-to-left collaterals filling the mid and distal LAD and a second diagonal branch.  The first diagonal is small to normal in size and has a 99% stenosis at its origin.  The left circumflex has 30% stenosis proximally, followed by a 100% occlusion in the mid vessel.  The distal circumflex, including what appears to be a normal sized branching obtuse marginal, fills by left-to-left collaterals. The circumflex also gives rise to a large branching ramus intermediate which has a 70% stenosis at its ostium.  The right coronary artery is a dominant vessel.  It is 100% occluded proximally just after a large right ventricular branch.  The distal vessel, including the posterior descending artery and posterolateral branch fill by left-to-right collaterals.  IMPRESSIONS: 1. Mildly decreased left ventricular systolic function secondary to ischemic    heart disease. 2. Advanced three-vessel coronary artery disease, including a distal left    main and chronic total occlusions of the mid left anterior descending    artery, mid circumflex, and proximal right coronary artery.  PLAN:  The patient will be referred for coronary artery bypass grafting. Abdominal ultrasound will be obtained to assess the size of her abdominal aortic aneurysm. DD:  01/07/01 TD:  01/08/01 Job: [id number]

## 2010-11-03 NOTE — Procedures (Signed)
[facility]  Patient:    Wanda Robinson, Wanda Robinson                        MRN: [id number] Proc. Date: 12/27/00 Adm. Date:  [id number] Attending:  Avie Echevaria CC:         Evette Georges, M.D. [facility]   Procedure Report  PROCEDURE:  Diagnostic fiberoptic bronchoscopy with foreign body extraction.  PULMONOLOGIST:  Charlaine Dalton. Sherene Sires, M.D. [facility]  HISTORY:  This is a 77 year old white female with a history of smoking, persistent cough, and also history of that she thinks she may have choked on a pill around Christmas time and got it lodged in her throat.  (She pointed in the office to the level of her larynx to describe where she thinks it is stuck.)  She has had refractory cough.  Despite a normal chest x-ray, I felt at this point a bronchoscopy was needed to explore the possibility of endobronchial lesion or foreign body.  She agreed to the procedure after full discussion of risks, benefits, and alternatives in the office.  (Please see dictated office notes.)  There has been no change on her examination.  She continues to have upper airway wheezes that are audible without the stethoscope but not with the stethoscope.  There is no evidence of any localized wheezing on exam.  DESCRIPTION OF BRONCHOSCOPY PROCEDURE:  The procedure was performed in the bronchoscopy suite with continuous monitoring by surface ECG and oximetry. She received a total of 50 mg IV Demerol and 7.5 mg IV Versed for conscious sedation and cough suppression.  The right naris and oropharynx were liberally anesthetized with  1% lidocaine aerosol and 2% lidocaine jelly to the right naris.  Using a standard flexible fiberoptic bronchoscope, the right naris was easily cannulated with good visualization of entire pharynx and larynx.  The cords moved normally, and there were no apparent upper airway lesions.  Using additional 1% lidocaine as needed, the entire tracheobronchial tree was explored  bilaterally with the following findings.  1. The trachea, carina, and all the left-sided airways were normal as was    the right upper lobe. 2. The right-sided airways were markedly abnormal.  There was an obvious white    tablet lodged in the right lower lobe just beyond the takeoff of the right    middle lobe.  There was moderate airway swelling around the pill which    appeared to be intact.  PROCEDURE: Using numerous extractions techniques (direct forceps, brushing, basket, and finally a three-pronged wire, the pill fragment was gradually broken loose and the underlying mucosa inspected.  It appeared quite inflamed, but there were no definite endobronchial lesions seen.  The patient tolerated the procedure well, and followup chest x-ray is pending.  IMPRESSION:  Foreign body extraction completed.  RECOMMENDATIONS:  Follow up as an outpatient.  We will place her on a very short course of prednisone (8 days) for the obvious residual inflammation in the airway. DD:  12/27/00 TD:  12/27/00 Job: [id number]

## 2010-11-03 NOTE — Op Note (Signed)
[place]. [facility]  Patient:    Wanda Robinson, Wanda Robinson                        MRN: [id number] Proc. Date: 01/09/01 Adm. Date:  [id number] Attending:  Charlett Lango CC:         Evette Georges, M.D. [facility]  Lewayne Bunting, M.D.   Operative Report  PREOPERATIVE DIAGNOSIS:  Persistent bleeding, status post coronary artery bypass grafting.  POSTOPERATIVE DIAGNOSIS:  Persistent bleeding, status post coronary artery bypass grafting.  OPERATION PERFORMED:  Re-exploration of mediastinum for bleeding.  SURGEON:  Salvatore Decent. Dorris Fetch, M.D.  ASSISTANT:  Lissa Merlin, P.A.  ANESTHESIA:  General.  OPERATIVE FINDINGS:  Multiple small bleeding sites from chest wall, sternum and mediastinal fat.  All anastomoses intact with no bleeding.  All grafts intact with no bleeding.  Cannulation sites without bleeding.  INDICATIONS FOR PROCEDURE:  The patient is a 77 year old female who on January 09, 2001 underwent coronary artery bypass grafting x 4.  Postoperatively she was taken to the surgical intensive care unit but had persistent bleeding from her mediastinal chest tubes.  With relatively normal coagulation factors and clotting blood in the tubes, and rising pulmonary artery pressures, it was felt that the patient needed re-exploration.  She was not in cardiac tamponade.  The patient was still sedated.  Therefore, consent was obtained from her daughter by phone.  The indications, risks, benefits and alternatives were discussed.  She agreed to proceed to the operating room.  DESCRIPTION OF PROCEDURE:  Ms. Gear was brought from the surgical intensive care unit to the operating room on January 09, 2001.  Intravenous access and invasive monitoring lines were already in place as was the Foley catheter. The patient was already sedated but general anesthesia was induced.  The chest and abdomen and upper legs were prepped and draped in the usual fashion.  The sternotomy incision  was reopened.  The sternal wires were removed.  The sternum was opened and there was significant blood and clot within the mediastinum.  There was no compression of the underlying cardiac structures. There were multiple bleeding sites on the posterior table of the sternum as well as oozing from around the wires but no definitive bleeding site responsible for large volume.  Next the internal mammary artery harvest site was inspected.  Again there were multiple areas where there was capillary oozing but no significant arterial or venous bleeder.  The chest tubes then were evacuated.  All clot was removed from the tubes and they were retracted out of the field.  There was a small amount of blood in both pleural spaces. The pericardial space was inspected.  There was a moderate amount of blood which was bright red with clots. The clots were evacuated.  The proximal anastomosis were inspected as was the aortic cannulation site.  No bleeding was noted.  The atrial cannulation site and pacemaker wire attachment sites were inspected.  All were intact with no evidence of bleeding.  The patient then was placed in Trendelenburg position while closely monitoring blood pressure and heart rate.  The heart was gently elevated to allow inspection of the distal anastomoses.  All were intact without any bleeding.  The vein grafts were inspected along their lengths as was the mammary artery and again no bleeding was noted from any of these sites.  Inspection of the mediastinal fat and pericardial edges revealed multiple areas where there was oozing of bright  red blood again with no definitive bleeder identified.  The mediastinum was packed with 4 x 18 gauzes.  After five minutes the gauzes were removed and there was no significant bleeding on them.  There was continued run-down from the sternum.  Over the next 30 minutes, the chest was carefully inspected and no definitive bleeding site could be identified.   The chest was irrigated with 1L of warm normal saline containing 1 gm of vancomycin.  The chest tubes were replaced into their standard position as the mediastinal fat was reapproximated over the ascending aorta and proximal vein graft anastomoses. The pericardium could not be closed.  The sternum then was closed with heavy gauge interrupted figure-of-eight stainless steel wires.  Careful inspection of the underside of the sternum and chest wall after placement of the wires showed no sites of bleeding.  The pectoralis fascia was closed with a running #1 Vicryl suture.  The subcutaneous tissue was closed with running 2-0 Vicryl suture and the skin was closed with staples.  Sponge, needle and instrument counts were correct at the end of the procedure.  The patient was hemodynamically stable and was transported from the operating room to the surgical intensive care unit in stable condition. DD:  01/10/01 TD:  01/10/01 Job: [id number]

## 2013-03-27 ENCOUNTER — Inpatient Hospital Stay (HOSPITAL_BASED_OUTPATIENT_CLINIC_OR_DEPARTMENT_OTHER)
Admission: EM | Admit: 2013-03-27 | Discharge: 2013-03-29 | DRG: 392 | Disposition: A | Discharge status: Home / Self Care | Payer: Medicare Other | Attending: Internal Medicine | Admitting: Internal Medicine

## 2013-03-27 ENCOUNTER — Encounter (HOSPITAL_BASED_OUTPATIENT_CLINIC_OR_DEPARTMENT_OTHER): Payer: Self-pay | Admitting: Emergency Medicine

## 2013-03-27 ENCOUNTER — Emergency Department (HOSPITAL_BASED_OUTPATIENT_CLINIC_OR_DEPARTMENT_OTHER): Payer: Medicare Other

## 2013-03-27 DIAGNOSIS — K311 Adult hypertrophic pyloric stenosis: Secondary | ICD-10-CM

## 2013-03-27 DIAGNOSIS — J449 Chronic obstructive pulmonary disease, unspecified: Secondary | ICD-10-CM | POA: Diagnosis present

## 2013-03-27 DIAGNOSIS — I2510 Atherosclerotic heart disease of native coronary artery without angina pectoris: Secondary | ICD-10-CM | POA: Diagnosis present

## 2013-03-27 DIAGNOSIS — E875 Hyperkalemia: Secondary | ICD-10-CM | POA: Diagnosis present

## 2013-03-27 DIAGNOSIS — E876 Hypokalemia: Secondary | ICD-10-CM

## 2013-03-27 DIAGNOSIS — J4489 Other specified chronic obstructive pulmonary disease: Secondary | ICD-10-CM | POA: Diagnosis present

## 2013-03-27 DIAGNOSIS — E860 Dehydration: Secondary | ICD-10-CM | POA: Diagnosis present

## 2013-03-27 DIAGNOSIS — D649 Anemia, unspecified: Secondary | ICD-10-CM | POA: Diagnosis present

## 2013-03-27 DIAGNOSIS — I252 Old myocardial infarction: Secondary | ICD-10-CM

## 2013-03-27 DIAGNOSIS — J45909 Unspecified asthma, uncomplicated: Secondary | ICD-10-CM

## 2013-03-27 DIAGNOSIS — Z87891 Personal history of nicotine dependence: Secondary | ICD-10-CM

## 2013-03-27 DIAGNOSIS — Z951 Presence of aortocoronary bypass graft: Secondary | ICD-10-CM

## 2013-03-27 DIAGNOSIS — K219 Gastro-esophageal reflux disease without esophagitis: Secondary | ICD-10-CM

## 2013-03-27 DIAGNOSIS — K440 Diaphragmatic hernia with obstruction, without gangrene: Principal | ICD-10-CM | POA: Diagnosis present

## 2013-03-27 DIAGNOSIS — I10 Essential (primary) hypertension: Secondary | ICD-10-CM | POA: Diagnosis present

## 2013-03-27 DIAGNOSIS — R7309 Other abnormal glucose: Secondary | ICD-10-CM | POA: Diagnosis present

## 2013-03-27 DIAGNOSIS — Z23 Encounter for immunization: Secondary | ICD-10-CM

## 2013-03-27 DIAGNOSIS — E7800 Pure hypercholesterolemia, unspecified: Secondary | ICD-10-CM | POA: Diagnosis present

## 2013-03-27 DIAGNOSIS — E039 Hypothyroidism, unspecified: Secondary | ICD-10-CM | POA: Diagnosis present

## 2013-03-27 DIAGNOSIS — M818 Other osteoporosis without current pathological fracture: Secondary | ICD-10-CM

## 2013-03-27 DIAGNOSIS — E785 Hyperlipidemia, unspecified: Secondary | ICD-10-CM | POA: Diagnosis present

## 2013-03-27 HISTORY — DX: Acute myocardial infarction, unspecified: I21.9

## 2013-03-27 HISTORY — DX: Anemia, unspecified: D64.9

## 2013-03-27 HISTORY — DX: Essential (primary) hypertension: I10

## 2013-03-27 HISTORY — DX: Polyp of colon: K63.5

## 2013-03-27 HISTORY — DX: Reserved for concepts with insufficient information to code with codable children: IMO0002

## 2013-03-27 LAB — COMPREHENSIVE METABOLIC PANEL
ALT: 17 U/L (ref 0–35)
Calcium: 10.5 mg/dL (ref 8.4–10.5)
GFR calc Af Amer: 53 mL/min — ABNORMAL LOW (ref >90)
Glucose, Bld: 206 mg/dL — ABNORMAL HIGH (ref 70–99)
Sodium: 138 mEq/L (ref 135–145)
Total Protein: 7.3 g/dL (ref 6.0–8.3)

## 2013-03-27 LAB — CBC WITH DIFFERENTIAL/PLATELET
Basophils Relative: 0 % (ref 0–1)
Eosinophils Absolute: 0.1 10*3/uL (ref 0.0–0.7)
Eosinophils Relative: 1 % (ref 0–5)
Lymphs Abs: 1.2 10*3/uL (ref 0.7–4.0)
MCH: 30.8 pg (ref 26.0–34.0)
MCHC: 35.6 g/dL (ref 30.0–36.0)
MCV: 86.5 fL (ref 78.0–100.0)
Platelets: 210 10*3/uL (ref 150–400)
RDW: 13.7 % (ref 11.5–15.5)

## 2013-03-27 LAB — TROPONIN I: Troponin I: <0.3 ng/mL (ref <0.30)

## 2013-03-27 MED ORDER — SODIUM CHLORIDE 0.9 % IV BOLUS (SEPSIS)
500.0000 mL | Freq: Once | INTRAVENOUS | Status: AC
  Administered 2013-03-28: 500 mL via INTRAVENOUS

## 2013-03-27 MED ORDER — FENTANYL CITRATE 0.05 MG/ML IJ SOLN
50.0000 ug | Freq: Once | INTRAMUSCULAR | Status: AC
  Administered 2013-03-27: 50 ug via INTRAVENOUS
  Filled 2013-03-27: qty 2

## 2013-03-27 MED ORDER — GI COCKTAIL ~~LOC~~
30.0000 mL | Freq: Once | ORAL | Status: AC
  Administered 2013-03-27: 30 mL via ORAL
  Filled 2013-03-27: qty 30

## 2013-03-27 MED ORDER — PANTOPRAZOLE SODIUM 40 MG IV SOLR
40.0000 mg | Freq: Once | INTRAVENOUS | Status: AC
  Administered 2013-03-27: 40 mg via INTRAVENOUS
  Filled 2013-03-27: qty 40

## 2013-03-27 NOTE — ED Notes (Signed)
Pt vomited most of GI Cocktail.

## 2013-03-27 NOTE — ED Notes (Signed)
MD at bedside. 

## 2013-03-27 NOTE — ED Provider Notes (Signed)
CSN: [id number]     Arrival date & time 03/27/13  2257 History   First MD Initiated Contact with Patient 03/27/13 2326    Scribed for Johncarlo Maalouf Smitty Cords, MD, the patient was seen in room MH09/MH09. This chart was scribed by Lewanda Rife, ED scribe. Patient's care was started at 11:27 PM  Chief Complaint  Patient presents with  . Abdominal Pain   (Consider location/radiation/quality/duration/timing/severity/associated sxs/prior Treatment) Patient is a 79 y.o. female presenting with abdominal pain. The history is provided by the patient. No language interpreter was used.  Abdominal Pain Pain location:  Epigastric Pain quality: dull   Pain radiates to:  Does not radiate Pain severity:  Severe Onset quality:  Gradual Timing:  Constant Progression:  Unchanged Chronicity:  New Context: not suspicious food intake   Relieved by:  Nothing Worsened by:  Nothing tried Ineffective treatments:  None tried Associated symptoms: no chest pain, no fever and no shortness of breath   Risk factors: no recent hospitalization    HPI Comments: Wanda Robinson is a 79 y.o. female who presents to the Emergency Department complaining of constant moderate epigastric pain radiating to back onset after 2 hours dinner at [employer] today. Reports eating fried shrimp and a salad. Reports associated nausea. Describes pain as dull. Denies any aggravating and alleviating factors. Denies sick contacts. Reports PMHx of stomach ulcer, cholecystectomy, and CABG.    No past medical history on file. Past Surgical History  Procedure Laterality Date  . Coronary artery bypass graft     No family history on file. History  Substance Use Topics  . Smoking status: Not on file  . Smokeless tobacco: Not on file  . Alcohol Use: Not on file   OB History   Grav Para Term Preterm Abortions TAB SAB Ect Mult Living                 Review of Systems  Constitutional: Negative for fever.  Respiratory: Negative for  shortness of breath.   Cardiovascular: Negative for chest pain.  Gastrointestinal: Positive for abdominal pain.  All other systems reviewed and are negative.   A complete 10 system review of systems was obtained and all systems are negative except as noted in the HPI and PMHx.    Allergies  Codeine phosphate  Home Medications   Current Outpatient Rx  Name  Route  Sig  Dispense  Refill  . ASPIRIN PO   Oral   Take by mouth.         . Ferrous Sulfate Dried (FERROUS SULFATE CR PO)   Oral   Take by mouth.         . LABETALOL HCL PO   Oral   Take by mouth.         Marland Kitchen LEVOTHYROXINE SODIUM PO   Oral   Take by mouth.         Marland Kitchen PRAVASTATIN SODIUM PO   Oral   Take by mouth.          BP 177/95  Pulse 81  Temp(Src) 97.3 F (36.3 C) (Oral)  Resp 20  SpO2 100% Physical Exam  Nursing note and vitals reviewed. Constitutional: She is oriented to person, place, and time. She appears well-developed and well-nourished. No distress.  HENT:  Head: Normocephalic and atraumatic.  Mouth/Throat: Oropharynx is clear and moist. No oropharyngeal exudate.  Eyes: Conjunctivae and EOM are normal. Pupils are equal, round, and reactive to light. No scleral icterus.  Neck: Normal range  of motion. Neck supple. No tracheal deviation present.  Cardiovascular: Normal rate and regular rhythm.   Murmur heard. Systolic ejection 3/6 with a diastolic 1/6   Pulmonary/Chest: Effort normal and breath sounds normal. No respiratory distress. She has no wheezes. She has no rales.  Abdominal: Soft. There is tenderness. There is no rebound and no guarding.  Hyperactive sounds in the epigastrium, LUQ, and left middle abdomen  Musculoskeletal: Normal range of motion. She exhibits no edema.  Neurological: She is alert and oriented to person, place, and time. She has normal reflexes.  Skin: Skin is warm and dry.  Psychiatric: She has a normal mood and affect. Her behavior is normal.    ED Course   Procedures (including critical care time) DIAGNOSTIC STUDIES: Oxygen Saturation is 100% on room air, normal by my interpretation.    COORDINATION OF CARE:  Nursing notes reviewed. Vital signs reviewed. Initial pt interview and examination performed.   11:33 PM-Discussed work up plan with pt at bedside, which includes EKG, CMP, CBC with diff panel, troponin, and lipase . Pt agrees with plan.   Treatment plan initiated: Medications  sodium chloride 0.9 % bolus 500 mL (not administered)  pantoprazole (PROTONIX) injection 40 mg (40 mg Intravenous Given 03/27/13 2339)  gi cocktail (Maalox,Lidocaine,Donnatal) (30 mLs Oral Given 03/27/13 2337)  fentaNYL (SUBLIMAZE) injection 50 mcg (50 mcg Intravenous Given 03/27/13 2346)     Initial diagnostic testing ordered.    Labs Review Labs Reviewed  CBC WITH DIFFERENTIAL - Abnormal; Notable for the following:    Hemoglobin 15.5 (*)    All other components within normal limits  COMPREHENSIVE METABOLIC PANEL - Abnormal; Notable for the following:    Potassium 3.2 (*)    Glucose, Bld 206 (*)    GFR calc non Af Amer 45 (*)    GFR calc Af Amer 53 (*)    All other components within normal limits  TROPONIN I  LIPASE, BLOOD   Imaging Review No results found.  EKG Interpretation   None       MDM  No diagnosis found.  Date: 03/28/2013  Rate: 82  Rhythm: normal sinus rhythm  QRS Axis: normal  Intervals: PR prolonged  ST/T Wave abnormalities: normal  Conduction Disutrbances:first-degree A-V block   Narrative Interpretation:   Old EKG Reviewed: changes noted  Case d/w Dr. Darylene Price of surgery no indication for CT, add lactate and NGT admit to medicine.  CCS will consult  I personally performed the services described in this documentation, which was scribed in my presence. The recorded information has been reviewed and is accurate.  MDM Reviewed: previous chart, nursing note and vitals Reviewed previous: ECG and x-ray Interpretation:  labs, ECG and x-ray (elevated glucose normal troponin low GFR no anion gap.  Large hiatal hernia filled with fluid no free air) Total time providing critical care: 30-74 minutes. This excludes time spent performing separately reportable procedures and services. Consults: general surgery and admitting MD    Medications  pantoprazole (PROTONIX) injection 40 mg (40 mg Intravenous Given 03/27/13 2339)  gi cocktail (Maalox,Lidocaine,Donnatal) (30 mLs Oral Given 03/27/13 2337)  fentaNYL (SUBLIMAZE) injection 50 mcg (50 mcg Intravenous Given 03/27/13 2346)  sodium chloride 0.9 % bolus 500 mL (500 mLs Intravenous New Bag/Given 03/28/13 0026)   NGT placed Results for orders placed during the hospital encounter of 03/27/13  CBC WITH DIFFERENTIAL      Result Value Range   WBC 6.7  4.0 - 10.5 K/uL   RBC 5.04  3.87 - 5.11 MIL/uL   Hemoglobin 15.5 (*) 12.0 - 15.0 g/dL   HCT 13.0  86.5 - 78.4 %   MCV 86.5  78.0 - 100.0 fL   MCH 30.8  26.0 - 34.0 pg   MCHC 35.6  30.0 - 36.0 g/dL   RDW 69.6  29.5 - 28.4 %   Platelets 210  150 - 400 K/uL   Neutrophils Relative % 75  43 - 77 %   Neutro Abs 5.1  1.7 - 7.7 K/uL   Lymphocytes Relative 18  12 - 46 %   Lymphs Abs 1.2  0.7 - 4.0 K/uL   Monocytes Relative 5  3 - 12 %   Monocytes Absolute 0.3  0.1 - 1.0 K/uL   Eosinophils Relative 1  0 - 5 %   Eosinophils Absolute 0.1  0.0 - 0.7 K/uL   Basophils Relative 0  0 - 1 %   Basophils Absolute 0.0  0.0 - 0.1 K/uL  COMPREHENSIVE METABOLIC PANEL      Result Value Range   Sodium 138  135 - 145 mEq/L   Potassium 3.2 (*) 3.5 - 5.1 mEq/L   Chloride 98  96 - 112 mEq/L   CO2 26  19 - 32 mEq/L   Glucose, Bld 206 (*) 70 - 99 mg/dL   BUN 20  6 - 23 mg/dL   Creatinine, Ser 1.32  0.50 - 1.10 mg/dL   Calcium 44.0  8.4 - 10.2 mg/dL   Total Protein 7.3  6.0 - 8.3 g/dL   Albumin 4.3  3.5 - 5.2 g/dL   AST 21  0 - 37 U/L   ALT 17  0 - 35 U/L   Alkaline Phosphatase 68  39 - 117 U/L   Total Bilirubin 0.8  0.3 - 1.2 mg/dL    GFR calc non Af Amer 45 (*) >90 mL/min   GFR calc Af Amer 53 (*) >90 mL/min  TROPONIN I      Result Value Range   Troponin I <0.30  <0.30 ng/mL  LIPASE, BLOOD      Result Value Range   Lipase 44  11 - 59 U/L   Dg Abd Acute W/chest  03/28/2013   CLINICAL DATA:  Epigastric pain radiating to the back. Nausea.  EXAM: ACUTE ABDOMEN SERIES (ABDOMEN 2 VIEW & CHEST 1 VIEW)  COMPARISON:  Chest radiographs dated 09/03/2009 and upper GI series dated 07/12/2004.  FINDINGS: A very large hiatal hernia is again demonstrated. This is significantly larger than seen on 09/03/2009. Mildly enlarged cardiac silhouette. Post CABG changes. Clear lungs.  Paucity of intestinal gas. No free peritoneal air. Air-fluid level in the large hiatal hernia. Ingested high density material in the left upper abdomen. Mild scoliosis. Upper abdominal vascular stents. Atheromatous arterial calcifications. Left humeral neck cyst.  IMPRESSION: 1. Significant increase in size of a large hiatal hernia, containing an air-fluid level, raising the possibility of gastric outlet obstruction. 2. Atheromatous arterial calcifications and upper abdominal vascular stents.   Electronically Signed   By: Gordan Payment M.D.   On: 03/28/2013 00:38     CRITICAL CARE Performed by: Jasmine Awe Total critical care time: 60 minutes Critical care time was exclusive of separately billable procedures and treating other patients. Critical care was necessary to treat or prevent imminent or life-threatening deterioration. Critical care was time spent personally by me on the following activities: development of treatment plan with patient and/or surrogate as well as nursing, discussions with consultants, evaluation of  patient's response to treatment, examination of patient, obtaining history from patient or surrogate, ordering and performing treatments and interventions, ordering and review of laboratory studies, ordering and review of radiographic  studies, pulse oximetry and re-evaluation of patient's condition.   Jasmine Awe, MD 03/28/13 [id number]

## 2013-03-27 NOTE — ED Notes (Addendum)
EMS report: pt ate at [employer] around 1730- reports epigatric pain and n/v starting around 1830- hx of gastric ulcers- zofran 4mg  IV given pta

## 2013-03-28 ENCOUNTER — Encounter (HOSPITAL_COMMUNITY): Payer: Self-pay

## 2013-03-28 ENCOUNTER — Inpatient Hospital Stay (HOSPITAL_COMMUNITY): Payer: Medicare Other

## 2013-03-28 DIAGNOSIS — I10 Essential (primary) hypertension: Secondary | ICD-10-CM

## 2013-03-28 DIAGNOSIS — K440 Diaphragmatic hernia with obstruction, without gangrene: Secondary | ICD-10-CM

## 2013-03-28 DIAGNOSIS — K311 Adult hypertrophic pyloric stenosis: Secondary | ICD-10-CM

## 2013-03-28 DIAGNOSIS — E876 Hypokalemia: Secondary | ICD-10-CM

## 2013-03-28 DIAGNOSIS — E039 Hypothyroidism, unspecified: Secondary | ICD-10-CM

## 2013-03-28 LAB — GLUCOSE, CAPILLARY
Glucose-Capillary: 104 mg/dL — ABNORMAL HIGH (ref 70–99)
Glucose-Capillary: 103 mg/dL — ABNORMAL HIGH (ref 70–99)
Glucose-Capillary: 90 mg/dL (ref 70–99)
Glucose-Capillary: 92 mg/dL (ref 70–99)

## 2013-03-28 LAB — TROPONIN I
Troponin I: <0.3 ng/mL (ref <0.30)
Troponin I: <0.3 ng/mL (ref <0.30)

## 2013-03-28 LAB — MAGNESIUM: Magnesium: 1.9 mg/dL (ref 1.5–2.5)

## 2013-03-28 LAB — HEMOGLOBIN A1C
Hgb A1c MFr Bld: 5.7 % — ABNORMAL HIGH (ref <5.7)
Mean Plasma Glucose: 117 mg/dL — ABNORMAL HIGH (ref <117)

## 2013-03-28 LAB — TSH: TSH: 4.524 u[IU]/mL — ABNORMAL HIGH (ref 0.350–4.500)

## 2013-03-28 MED ORDER — LEVOTHYROXINE SODIUM 100 MCG IV SOLR
56.0000 ug | Freq: Every day | INTRAVENOUS | Status: DC
  Administered 2013-03-28: 11:00:00 via INTRAVENOUS
  Administered 2013-03-29: 56 ug via INTRAVENOUS
  Filled 2013-03-28 (×2): qty 5

## 2013-03-28 MED ORDER — ENOXAPARIN SODIUM 40 MG/0.4ML ~~LOC~~ SOLN
40.0000 mg | SUBCUTANEOUS | Status: DC
  Administered 2013-03-28: 40 mg via SUBCUTANEOUS
  Filled 2013-03-28 (×2): qty 0.4

## 2013-03-28 MED ORDER — ACETAMINOPHEN 650 MG RE SUPP: 650.0000 mg | Freq: Four times a day (QID) | RECTAL | Status: DC | PRN

## 2013-03-28 MED ORDER — ONDANSETRON HCL 4 MG/2ML IJ SOLN: 4.0000 mg | Freq: Four times a day (QID) | INTRAMUSCULAR | Status: DC | PRN

## 2013-03-28 MED ORDER — INSULIN ASPART 100 UNIT/ML ~~LOC~~ SOLN: 0.0000 [IU] | Freq: Three times a day (TID) | SUBCUTANEOUS | Status: DC

## 2013-03-28 MED ORDER — INFLUENZA VAC SPLIT QUAD 0.5 ML IM SUSP
0.5000 mL | INTRAMUSCULAR | Status: DC
  Filled 2013-03-28: qty 0.5

## 2013-03-28 MED ORDER — POTASSIUM CHLORIDE 10 MEQ/100ML IV SOLN
10.0000 meq | INTRAVENOUS | Status: AC
  Administered 2013-03-28 (×2): 10 meq via INTRAVENOUS
  Filled 2013-03-28 (×2): qty 100

## 2013-03-28 MED ORDER — SODIUM CHLORIDE 0.9 % IJ SOLN
3.0000 mL | Freq: Two times a day (BID) | INTRAMUSCULAR | Status: DC
  Administered 2013-03-28 – 2013-03-29 (×2): 3 mL via INTRAVENOUS

## 2013-03-28 MED ORDER — HYDRALAZINE HCL 20 MG/ML IJ SOLN
10.0000 mg | Freq: Four times a day (QID) | INTRAMUSCULAR | Status: DC | PRN
  Administered 2013-03-28: 23:00:00 10 mg via INTRAVENOUS
  Filled 2013-03-28: qty 1

## 2013-03-28 MED ORDER — MORPHINE SULFATE 2 MG/ML IJ SOLN: 2.0000 mg | INTRAMUSCULAR | Status: DC | PRN

## 2013-03-28 MED ORDER — ONDANSETRON HCL 4 MG PO TABS: 4.0000 mg | ORAL_TABLET | Freq: Four times a day (QID) | ORAL | Status: DC | PRN

## 2013-03-28 MED ORDER — ACETAMINOPHEN 325 MG PO TABS: 650.0000 mg | ORAL_TABLET | Freq: Four times a day (QID) | ORAL | Status: DC | PRN

## 2013-03-28 MED ORDER — POTASSIUM CHLORIDE IN NACL 20-0.9 MEQ/L-% IV SOLN
INTRAVENOUS | Status: DC
  Administered 2013-03-28 (×2): via INTRAVENOUS
  Filled 2013-03-28 (×2): qty 1000

## 2013-03-28 MED ORDER — SODIUM CHLORIDE 0.9 % IV SOLN
INTRAVENOUS | Status: DC
  Administered 2013-03-28: 04:00:00 via INTRAVENOUS
  Filled 2013-03-28 (×4): qty 1000

## 2013-03-28 MED ORDER — METOPROLOL TARTRATE 1 MG/ML IV SOLN
2.5000 mg | Freq: Four times a day (QID) | INTRAVENOUS | Status: DC
  Administered 2013-03-28: 2.5 mg via INTRAVENOUS
  Filled 2013-03-28 (×5): qty 5

## 2013-03-28 NOTE — Consult Note (Signed)
I have seen and examined the pt and agree with PA-Osborne's progress note. DC NGT Trial cLD and adv as tol Pt need Cardiology f/u 2/2 to murmur and stenotic valve. Can f/u  As outpt if dc'd when tol PO

## 2013-03-28 NOTE — H&P (Addendum)
[facility] History and Physical  Wanda Robinson ZOX:[id number] DOB: [birth year] DOA: 03/27/2013   PCP: Dois Davenport., MD   Chief Complaint:  Abdominal pain and vomiting  HPI:  79 year old female with a history of hypertension, myocardial infarction, hyperlipidemia presents with one-day history of abdominal pain vomiting. This began on the evening of 03/27/2013 approximately 2 hours after eating at [employer]. The patient had innumerable episodes of bilious vomiting followed by abdominal pain. The patient is a poor historian. The patient's daughter at the bedside supplements the history. Similar episodes have occurred at least 4 times in this past year, but they have resolved spontaneously in a matter of hours. Because the patient had intractable vomiting, she was taken to [facility]. After the vomiting episodes, the patient also complained of some chest discomfort without any radiation. The patient had been in her usual state of health prior to this evening. There were no prior complaints fevers, chills chest discomfort, shortness breath, nausea, vomiting, diarrhea, constipation, hematochezia, melena. The patient has not had any changes in her medications. She last had a bowel movement the day prior to admission.  In the emergency department, acute abdominal series showed an air-fluid level within a large hiatal hernia suggesting a possible gastric outlet obstruction. General surgery was contacted and will see the patient in consultation. They did not feel any acute surgical intervention is necessary at this time. BMP revealed potassium 3.2 with serum creatinine 1.10. CBC showed hemoglobin 15.5. Lactic acid was 1.21 with troponin that was negative. EKG shows sinus rhythm with first degree AV block. Hepatic enzymes and lipase were normal. Assessment/Plan: Gastric outlet obstruction -Will need GI consult in the morning -NG tube was placed in the ED, initially to suction with improvement of  the patient's vomiting and abdominal pain -Repeat abdominal x-rays in the morning -General surgery will see the patient in consultation -Keep patient n.p.o. -Will try to change essential meds to IV -IV hydration, optimize electrolytes Hypertension -Start the patient on metoprolol IV 2.5 mg IV every 6 hours -Discontinue po amlodipine, labetolol and HCTZ Hypothyroidism -Check TSH -Change Synthroid to IV Hypokalemia -likely due to vomiting -Replete -Check magnesium Hyperlipidemia -Hold Pravachol for now Dehydration -Secondary to the patient's intractable vomiting -The patient is hemoconcentrated with a hemoglobin of 15.5 Hyperglycemia -Hemoglobin A1c -ISS      Past Medical History  Diagnosis Date  . MI (myocardial infarction)   . Hypertension   . High cholesterol   . Ulcer   . Anemia   . Colon polyps    Past Surgical History  Procedure Laterality Date  . Coronary artery bypass graft     Social History:  reports that she has quit smoking. She has never used smokeless tobacco. She reports that she does not drink alcohol or use illicit drugs.   History reviewed. No pertinent family history.   Allergies  Allergen Reactions  . Codeine Phosphate     REACTION: unspecified      Prior to Admission medications   Medication Sig Start Date End Date Taking? Authorizing Provider  amLODipine (NORVASC) 10 MG tablet Take 10 mg by mouth daily.   Yes Historical Provider, MD  ASPIRIN PO Take 81 mg by mouth daily.    Yes Historical Provider, MD  Ferrous Sulfate Dried (FERROUS SULFATE CR PO) Take by mouth.   Yes Historical Provider, MD  hydrochlorothiazide (HYDRODIURIL) 25 MG tablet Take 25 mg by mouth daily.   Yes Historical Provider, MD  LABETALOL HCL PO Take 200  mg by mouth 2 (two) times daily.    Yes Historical Provider, MD  LEVOTHYROXINE SODIUM PO Take 112 mcg by mouth daily.    Yes Historical Provider, MD  PRAVASTATIN SODIUM PO Take by mouth.   Yes Historical Provider, MD     Review of Systems:  Constitutional:  No weight loss, night sweats, Fevers, chills, Head&Eyes: No headache.  No vision loss.  No eye pain ENT:  No Difficulty swallowing,Tooth/dental problems,Sore throat,   Cardio-vascular:  No chest pain, Orthopnea, PND, swelling in lower extremities,  dizziness,  GI:  No  abdominal pain, nausea, vomiting, diarrhea, loss of appetite, hematochezia, melena, heartburn, Resp:  No shortness of breath with exertion or at rest. No cough. No coughing up of blood .No wheezing. Skin:  no rash or lesions.  GU:  no dysuria, change in color of urine, no urgency or frequency. No flank pain.  Musculoskeletal:  No joint pain or swelling. No decreased range of motion. No back pain.  Psych:  No change in mood or affect. No depression or anxiety. Neurologic: No headache, no dysesthesia, no focal weakness, no vision loss. No syncope  Physical Exam: Filed Vitals:   03/27/13 2301 03/27/13 2358 03/28/13 0141 03/28/13 0253  BP: 177/95 165/76 158/56 146/66  Pulse: 81  77 79  Temp: 97.3 F (36.3 C)  97.3 F (36.3 C) 97.5 F (36.4 C)  TempSrc: Oral  Oral Oral  Resp: 20  20 18   Height:    5\' 4"  (1.626 m)  Weight:    62.9 kg (138 lb 10.7 oz)  SpO2: 100% 97% 94% 96%   General:  Alert and awake, NAD, nontoxic, pleasant/cooperative Head/Eye: No conjunctival hemorrhage, no icterus, [place]/AT, No nystagmus ENT:  No icterus,  No thrush, good dentition, no pharyngeal exudate Neck:  No masses, no lymphadenpathy, no meningismus CV:  RRR, no rub, no gallop, no S3 Lung:  CTAB, good air movement, no wheeze, no rhonchi Abdomen: soft/NT, +BS, nondistended, no peritoneal signs Ext: No cyanosis, No rashes, No petechiae, No lymphangitis, 1+ LE edema   Labs on Admission:  Basic Metabolic Panel:  Recent Labs Lab 03/27/13 2320  NA 138  K 3.2*  CL 98  CO2 26  GLUCOSE 206*  BUN 20  CREATININE 1.10  CALCIUM 10.5   Liver Function Tests:  Recent Labs Lab  03/27/13 2320  AST 21  ALT 17  ALKPHOS 68  BILITOT 0.8  PROT 7.3  ALBUMIN 4.3    Recent Labs Lab 03/27/13 2320  LIPASE 44   No results found for this basename: AMMONIA,  in the last 168 hours CBC:  Recent Labs Lab 03/27/13 2320  WBC 6.7  NEUTROABS 5.1  HGB 15.5*  HCT 43.6  MCV 86.5  PLT 210   Cardiac Enzymes:  Recent Labs Lab 03/27/13 2320  TROPONINI <0.30   BNP: No components found with this basename: POCBNP,  CBG: No results found for this basename: GLUCAP,  in the last 168 hours  Radiological Exams on Admission: Dg Abd Acute W/chest  03/28/2013   CLINICAL DATA:  Epigastric pain radiating to the back. Nausea.  EXAM: ACUTE ABDOMEN SERIES (ABDOMEN 2 VIEW & CHEST 1 VIEW)  COMPARISON:  Chest radiographs dated 09/03/2009 and upper GI series dated 07/12/2004.  FINDINGS: A very large hiatal hernia is again demonstrated. This is significantly larger than seen on 09/03/2009. Mildly enlarged cardiac silhouette. Post CABG changes. Clear lungs.  Paucity of intestinal gas. No free peritoneal air. Air-fluid level in the large hiatal  hernia. Ingested high density material in the left upper abdomen. Mild scoliosis. Upper abdominal vascular stents. Atheromatous arterial calcifications. Left humeral neck cyst.  IMPRESSION: 1. Significant increase in size of a large hiatal hernia, containing an air-fluid level, raising the possibility of gastric outlet obstruction. 2. Atheromatous arterial calcifications and upper abdominal vascular stents.   Electronically Signed   By: Gordan Payment M.D.   On: 03/28/2013 00:38    EKG: Independently reviewed. Sinus rhythm, first degree AV block, nonspecific T-wave changes    Time spent:60 minutes Code Status:   FULL Family Communication:   Daughter at bedside   Miroslav Gin, DO  [facility] Pager [phone]  If 7PM-7AM, please contact night-coverage [email or web address] Password [email or web address] 03/28/2013, 3:30 AM

## 2013-03-28 NOTE — Progress Notes (Signed)
Page to [facility] admissions for notification of pt arrival to the floor. Marland Kitchen

## 2013-03-28 NOTE — ED Notes (Signed)
 brown fluids with some solids drained from NG tube.  After removal of stomach contents pt reports complete relief of abd pain.

## 2013-03-28 NOTE — Progress Notes (Signed)
[facility]                                                                                Patient Demographics  Wanda Robinson, is a 79 y.o. female, DOB - Dec 24, 1928, ZOX:[id number]  Admit date - 03/27/2013   Admitting Physician Therisa Doyne, MD  Outpatient Primary MD for the patient is Dois Davenport., MD  LOS - 1   Chief Complaint  Patient presents with  . Abdominal Pain        Assessment & Plan    1.Abd pain, N&V -  history of heart hernia, initial x-ray suggestive of possible gastric outlet obstruction, with supportive care of bowel rest and NG tube she feels much better, will repeat x-ray, general surgery requested to see the patient. Discussed with general surgeon on call Dr. Bradly Bienenstock. Gentle IV fluids    2. Hypertension stable. IV hydralazine as needed oral medications on hold due to n.p.o. Status.    3. Hypothyroidism. Synthroid to IV, repeat TSH pending     4. Hypokalemia. Replaced. Repeat in the morning.     5. History of CAD and Aortic murmur exam. Is chronic. Currently appears compensated .     6. Dehydration with hyperkalemia. Gentle IV fluids, was due to nausea and vomiting.    7. Hyperglycemia. No history of diabetes mellitus, pending A1c for now sliding scale   Lab Results  Component Value Date   HGBA1C  Value: 6.2 (NOTE) The ADA recommends the following therapeutic goal for glycemic control related to Hgb A1c measurement: Goal of therapy: <6.5 Hgb A1c  Reference: American Diabetes Association: Clinical Practice Recommendations 2010, Diabetes Care, 2010, 33: (Suppl  1).* 09/03/2009     8. Dyslipidemia. Resume home dose statin once taking by mouth.      Code Status: Full  Family Communication: daughter  Disposition Plan: Home   Procedures KUB   Consults CCS   DVT Prophylaxis  Lovenox    Lab Results  Component Value Date   PLT 210 03/27/2013    Medications  Scheduled Meds: . enoxaparin (LOVENOX) injection   40 mg Subcutaneous Q24H  . [START ON 03/29/2013] influenza vac split quadrivalent PF  0.5 mL Intramuscular Tomorrow-1000  . insulin aspart  0-9 Units Subcutaneous TID WC  . levothyroxine  56 mcg Intravenous Daily  . metoprolol  2.5 mg Intravenous Q6H  . sodium chloride  3 mL Intravenous Q12H   Continuous Infusions: . sodium chloride 0.9 % 1,000 mL with potassium chloride 20 mEq infusion Stopped (03/28/13 0518)   PRN Meds:.acetaminophen, acetaminophen, morphine injection, ondansetron (ZOFRAN) IV, ondansetron  Antibiotics    Anti-infectives   None       Time Spent in minutes   35   SINGH,PRASHANT K M.D on 03/28/2013 at 9:15 AM  Between 7am to 7pm - Pager - [phone]  After 7pm go to [email or web address] - password [email or web address]  And look for the night coverage person covering for me after hours  [facility] Office  [phone]    Subjective:   Wanda Robinson today has, No headache, No chest pain, No abdominal pain - No Nausea, No new weakness tingling or numbness, No Cough - SOB.  Objective:   Filed Vitals:   03/27/13 2358 03/28/13 0141 03/28/13 0253 03/28/13 0521  BP: 165/76 158/56 146/66 155/72  Pulse:  77 79 80  Temp:  97.3 F (36.3 C) 97.5 F (36.4 C) 97.5 F (36.4 C)  TempSrc:  Oral Oral Oral  Resp:  20 18 18   Height:   5\' 4"  (1.626 m)   Weight:   62.9 kg (138 lb 10.7 oz)   SpO2: 97% 94% 96% 96%    Wt Readings from Last 3 Encounters:  03/28/13 62.9 kg (138 lb 10.7 oz)  01/19/08 65.318 kg (144 lb)  01/15/07 62.143 kg (137 lb)     Intake/Output Summary (Last 24 hours) at 03/28/13 0915 Last data filed at 03/28/13 0900  Gross per 24 hour  Intake  11.25 ml  Output    601 ml  Net -589.75 ml    Exam  Awake Alert, Oriented X 3, No new F.N deficits, Normal affect [place].AT,PERRAL Supple Neck,No JVD, No cervical lymphadenopathy appriciated.  Symmetrical Chest wall movement, Good air movement bilaterally, CTAB RRR,No Gallops,Rubs , +ve Aortic systolic  murmur  Murmurs, No Parasternal Heave +ve B.Sounds, Abd Soft, Non tender, No organomegaly appriciated, No rebound - guarding or rigidity, NG in place No Cyanosis, Clubbing or edema, No new Rash or bruise     Data Review   Micro Results No results found for this or any previous visit (from the past 240 hour(s)).  Radiology Reports Dg Abd Acute W/chest  03/28/2013   CLINICAL DATA:  Epigastric pain radiating to the back. Nausea.  EXAM: ACUTE ABDOMEN SERIES (ABDOMEN 2 VIEW & CHEST 1 VIEW)  COMPARISON:  Chest radiographs dated 09/03/2009 and upper GI series dated 07/12/2004.  FINDINGS: A very large hiatal hernia is again demonstrated. This is significantly larger than seen on 09/03/2009. Mildly enlarged cardiac silhouette. Post CABG changes. Clear lungs.  Paucity of intestinal gas. No free peritoneal air. Air-fluid level in the large hiatal hernia. Ingested high density material in the left upper abdomen. Mild scoliosis. Upper abdominal vascular stents. Atheromatous arterial calcifications. Left humeral neck cyst.  IMPRESSION: 1. Significant increase in size of a large hiatal hernia, containing an air-fluid level, raising the possibility of gastric outlet obstruction. 2. Atheromatous arterial calcifications and upper abdominal vascular stents.   Electronically Signed   By: Gordan Payment M.D.   On: 03/28/2013 00:38    CBC  Recent Labs Lab 03/27/13 2320  WBC 6.7  HGB 15.5*  HCT 43.6  PLT 210  MCV 86.5  MCH 30.8  MCHC 35.6  RDW 13.7  LYMPHSABS 1.2  MONOABS 0.3  EOSABS 0.1  BASOSABS 0.0    Chemistries   Recent Labs Lab 03/27/13 2320 03/28/13 0552  NA 138  --   K 3.2*  --   CL 98  --   CO2 26  --   GLUCOSE 206*  --   BUN 20  --   CREATININE 1.10  --   CALCIUM 10.5  --   MG  --  1.9  AST 21  --   ALT 17  --   ALKPHOS 68  --   BILITOT 0.8  --    ------------------------------------------------------------------------------------------------------------------ estimated  creatinine clearance is 34 ml/min (by C-G formula based on Cr of 1.1). ------------------------------------------------------------------------------------------------------------------ No results found for this basename: HGBA1C,  in the last 72 hours ------------------------------------------------------------------------------------------------------------------ No results found for this basename: CHOL, HDL, LDLCALC, TRIG, CHOLHDL, LDLDIRECT,  in the last 72 hours ------------------------------------------------------------------------------------------------------------------ No results found for this  basename: TSH, T4TOTAL, FREET3, T3FREE, THYROIDAB,  in the last 72 hours ------------------------------------------------------------------------------------------------------------------ No results found for this basename: VITAMINB12, FOLATE, FERRITIN, TIBC, IRON, RETICCTPCT,  in the last 72 hours  Coagulation profile No results found for this basename: INR, PROTIME,  in the last 168 hours  No results found for this basename: DDIMER,  in the last 72 hours  Cardiac Enzymes  Recent Labs Lab 03/27/13 2320 03/28/13 0552  TROPONINI <0.30 <0.30   ------------------------------------------------------------------------------------------------------------------ No components found with this basename: POCBNP,

## 2013-03-28 NOTE — ED Notes (Signed)
Assigned to bed 5W30 @[facility] , [facility] called for transport, RN notified.

## 2013-03-28 NOTE — Consult Note (Signed)
Wanda Robinson 04/23/1929  [id number].   Primary Care MD: Dr. Hal Hope Requesting MD: Dr. Susa Raring Chief Complaint/Reason for Consult: gastric obstruction secondary to hiatal hernia HPI: This is an 79 yo white female who has a known hiatal hernia noted on EGD by Dr. Loreta Ave years ago.  Daughter states that the patient has intermittent episodes of 1-2 hrs of emesis occasionally, but never has pain and it resolves on its own.  Last night about 2 hours after supper, she started having some emesis and acute onset of epigastric chest pain.  She was brought to the [facility] where she had a film that revealed a gastric obstruction secondary to a hiatal hernia.  An NGT was placed and after some suction, the hernia has partially reduced.  The patient's pain is completely resolved and no further nausea as her NG has been clamped now for multiple hours.  We have been asked to see her for further recommendations  ROS: please see HPI, otherwise all other systems are negative  History reviewed. No pertinent family history.  Past Medical History  Diagnosis Date  . MI (myocardial infarction)   . Hypertension   . High cholesterol   . Ulcer   . Anemia   . Colon polyps   . Murmur, cardiac     Past Surgical History  Procedure Laterality Date  . Coronary artery bypass graft    . Cholecystectomy      open  . Carotid endarterectomy Left     Social History:  reports that she has quit smoking. She has never used smokeless tobacco. She reports that she does not drink alcohol or use illicit drugs.  Allergies:  Allergies  Allergen Reactions  . Codeine Phosphate Nausea Only    Medications Prior to Admission  Medication Sig Dispense Refill  . amLODipine (NORVASC) 10 MG tablet Take 10 mg by mouth daily.      Marland Kitchen aspirin EC 81 MG tablet Take 81 mg by mouth daily.      . Cholecalciferol (VITAMIN D-3) 5000 UNITS TABS Take 5,000 Units by mouth daily.      . ferrous sulfate 325 (65 FE) MG tablet Take 325 mg by  mouth 2 (two) times daily.      . hydrochlorothiazide (HYDRODIURIL) 25 MG tablet Take 25 mg by mouth daily.      Marland Kitchen labetalol (NORMODYNE) 200 MG tablet Take 200 mg by mouth 2 (two) times daily.      Marland Kitchen levothyroxine (SYNTHROID, LEVOTHROID) 125 MCG tablet Take 125 mcg by mouth daily before breakfast.      . naproxen sodium (ANAPROX) 220 MG tablet Take 220 mg by mouth daily.      . pravastatin (PRAVACHOL) 80 MG tablet Take 80 mg by mouth daily.        Blood pressure 155/72, pulse 80, temperature 97.5 F (36.4 C), temperature source Oral, resp. rate 18, height 5\' 4"  (1.626 m), weight 138 lb 10.7 oz (62.9 kg), SpO2 96.00%. Physical Exam: General: pleasant, WD, WN white female who is laying in bed in NAD HEENT: head is normocephalic, atraumatic.  Sclera are noninjected.  PERRL.  Ears and nose without any masses or lesions.  Mouth is pink and moist Heart: regular, rate, and rhythm.  Normal s1,s2. No obvious gallops, or rubs noted. Very loud murmur.  Palpable radial and pedal pulses bilaterally Lungs: CTAB, no wheezes, rhonchi, or rales noted.  Respiratory effort nonlabored Abd: soft, NT, ND, +BS, no masses, hernias, or organomegaly MS: all 4 extremities  are symmetrical with no cyanosis, clubbing. + 1 edema Skin: warm and dry with no masses, lesions, or rashes Psych: A&Ox3 with an appropriate affect.    Results for orders placed during the hospital encounter of 03/27/13 (from the past 48 hour(s))  CBC WITH DIFFERENTIAL     Status: Abnormal   Collection Time    03/27/13 11:20 PM      Result Value Range   WBC 6.7  4.0 - 10.5 K/uL   RBC 5.04  3.87 - 5.11 MIL/uL   Hemoglobin 15.5 (*) 12.0 - 15.0 g/dL   HCT 82.9  56.2 - 13.0 %   MCV 86.5  78.0 - 100.0 fL   MCH 30.8  26.0 - 34.0 pg   MCHC 35.6  30.0 - 36.0 g/dL   RDW 86.5  78.4 - 69.6 %   Platelets 210  150 - 400 K/uL   Neutrophils Relative % 75  43 - 77 %   Neutro Abs 5.1  1.7 - 7.7 K/uL   Lymphocytes Relative 18  12 - 46 %   Lymphs Abs 1.2   0.7 - 4.0 K/uL   Monocytes Relative 5  3 - 12 %   Monocytes Absolute 0.3  0.1 - 1.0 K/uL   Eosinophils Relative 1  0 - 5 %   Eosinophils Absolute 0.1  0.0 - 0.7 K/uL   Basophils Relative 0  0 - 1 %   Basophils Absolute 0.0  0.0 - 0.1 K/uL  COMPREHENSIVE METABOLIC PANEL     Status: Abnormal   Collection Time    03/27/13 11:20 PM      Result Value Range   Sodium 138  135 - 145 mEq/L   Potassium 3.2 (*) 3.5 - 5.1 mEq/L   Chloride 98  96 - 112 mEq/L   CO2 26  19 - 32 mEq/L   Glucose, Bld 206 (*) 70 - 99 mg/dL   BUN 20  6 - 23 mg/dL   Creatinine, Ser 2.95  0.50 - 1.10 mg/dL   Calcium 28.4  8.4 - 13.2 mg/dL   Total Protein 7.3  6.0 - 8.3 g/dL   Albumin 4.3  3.5 - 5.2 g/dL   AST 21  0 - 37 U/L   ALT 17  0 - 35 U/L   Alkaline Phosphatase 68  39 - 117 U/L   Total Bilirubin 0.8  0.3 - 1.2 mg/dL   GFR calc non Af Amer 45 (*) >90 mL/min   GFR calc Af Amer 53 (*) >90 mL/min   Comment: (NOTE)     The eGFR has been calculated using the CKD EPI equation.     This calculation has not been validated in all clinical situations.     eGFR's persistently <90 mL/min signify possible Chronic Kidney     Disease.  TROPONIN I     Status: None   Collection Time    03/27/13 11:20 PM      Result Value Range   Troponin I <0.30  <0.30 ng/mL   Comment:            Due to the release kinetics of cTnI,     a negative result within the first hours     of the onset of symptoms does not rule out     myocardial infarction with certainty.     If myocardial infarction is still suspected,     repeat the test at appropriate intervals.  LIPASE, BLOOD     Status: None  Collection Time    03/27/13 11:20 PM      Result Value Range   Lipase 44  11 - 59 U/L  CG4 I-STAT (LACTIC ACID)     Status: None   Collection Time    03/28/13  1:46 AM      Result Value Range   Lactic Acid, Venous 1.21  0.5 - 2.2 mmol/L  GLUCOSE, CAPILLARY     Status: Abnormal   Collection Time    03/28/13  5:18 AM      Result Value Range    Glucose-Capillary 104 (*) 70 - 99 mg/dL  MAGNESIUM     Status: None   Collection Time    03/28/13  5:52 AM      Result Value Range   Magnesium 1.9  1.5 - 2.5 mg/dL  TROPONIN I     Status: None   Collection Time    03/28/13  5:52 AM      Result Value Range   Troponin I <0.30  <0.30 ng/mL   Comment:            Due to the release kinetics of cTnI,     a negative result within the first hours     of the onset of symptoms does not rule out     myocardial infarction with certainty.     If myocardial infarction is still suspected,     repeat the test at appropriate intervals.  GLUCOSE, CAPILLARY     Status: Abnormal   Collection Time    03/28/13  7:52 AM      Result Value Range   Glucose-Capillary 103 (*) 70 - 99 mg/dL   Comment 1 Documented in Chart     Comment 2 Notify RN    TROPONIN I     Status: None   Collection Time    03/28/13  9:37 AM      Result Value Range   Troponin I <0.30  <0.30 ng/mL   Comment:            Due to the release kinetics of cTnI,     a negative result within the first hours     of the onset of symptoms does not rule out     myocardial infarction with certainty.     If myocardial infarction is still suspected,     repeat the test at appropriate intervals.   Dg Abd Acute W/chest  03/28/2013   CLINICAL DATA:  Nausea and vomiting.  EXAM: ACUTE ABDOMEN SERIES (ABDOMEN 2 VIEW & CHEST 1 VIEW)  COMPARISON:  Previous exam performed earlier this same date.  FINDINGS: Normal bowel gas pattern. No free air.  There are dense aortoiliac and branch vessel calcifications. Two vascular stents are noted in the upper abdomen along the midline. The soft tissues are otherwise unremarkable.  The lungs are clear. The hiatal hernia smaller subsequent to placement of a nasogastric tube. The tube curls within the hiatal hernia and does not pass below the diaphragm.  IMPRESSION: No acute findings below the diaphragm. No evidence of a bowel obstruction, generalized ileus or free  air.  Hiatal hernia is partly decompressed due to placement of a nasogastric tube, which curls within the hernia. It does not passed below the diaphragm.   Electronically Signed   By: Amie Portland M.D.   On: 03/28/2013 11:07   Dg Abd Acute W/chest  03/28/2013   CLINICAL DATA:  Epigastric pain radiating to the back. Nausea.  EXAM: ACUTE  ABDOMEN SERIES (ABDOMEN 2 VIEW & CHEST 1 VIEW)  COMPARISON:  Chest radiographs dated 09/03/2009 and upper GI series dated 07/12/2004.  FINDINGS: A very large hiatal hernia is again demonstrated. This is significantly larger than seen on 09/03/2009. Mildly enlarged cardiac silhouette. Post CABG changes. Clear lungs.  Paucity of intestinal gas. No free peritoneal air. Air-fluid level in the large hiatal hernia. Ingested high density material in the left upper abdomen. Mild scoliosis. Upper abdominal vascular stents. Atheromatous arterial calcifications. Left humeral neck cyst.  IMPRESSION: 1. Significant increase in size of a large hiatal hernia, containing an air-fluid level, raising the possibility of gastric outlet obstruction. 2. Atheromatous arterial calcifications and upper abdominal vascular stents.   Electronically Signed   By: Gordan Payment M.D.   On: 03/28/2013 00:38       Assessment/Plan 1. Gastric obstruction secondary to hiatal hernia, now reduced 2. Loud Cardiac murmur Patient Active Problem List   Diagnosis Date Noted  . Gastric outlet obstruction 03/28/2013  . Hypokalemia 03/28/2013  . OSTEOPOROSIS NEC 01/15/2007  . HYPOTHYROIDISM 12/12/2006  . HYPERLIPIDEMIA 12/12/2006  . HYPERTENSION 12/12/2006  . CORONARY ARTERY DISEASE 12/12/2006  . ASTHMA 12/12/2006  . COPD 12/12/2006  . GERD 12/12/2006   Plan: 1. Patient seems to have resolved her obstruction on her own.  We will dc her NGT for now and give her clear liquids.  Due to chronic symptoms of intermittent obstruction, she may benefit from elective repair.  The patient has multiple co-morbidities  which put her at risk for surgery.  She has a very loud cardiac murmur.  She would need cardiac clearance prior to any type of surgical intervention.  Her diet can likely be advanced as tolerates here.  We will follow along. This has all been d/w the patient and her daughters.  They all understand and agree.  Bryndon Cumbie E 03/28/2013, 12:25 PM Pager: [phone]

## 2013-03-28 NOTE — ED Notes (Signed)
MD at bedside discussing test results. 

## 2013-03-28 NOTE — ED Notes (Signed)
Patient transported to X-ray 

## 2013-03-29 DIAGNOSIS — J449 Chronic obstructive pulmonary disease, unspecified: Secondary | ICD-10-CM

## 2013-03-29 DIAGNOSIS — I2510 Atherosclerotic heart disease of native coronary artery without angina pectoris: Secondary | ICD-10-CM

## 2013-03-29 DIAGNOSIS — K219 Gastro-esophageal reflux disease without esophagitis: Secondary | ICD-10-CM

## 2013-03-29 LAB — BASIC METABOLIC PANEL
Calcium: 9.2 mg/dL (ref 8.4–10.5)
Chloride: 105 mEq/L (ref 96–112)
GFR calc Af Amer: 69 mL/min — ABNORMAL LOW (ref >90)
GFR calc non Af Amer: 60 mL/min — ABNORMAL LOW (ref >90)
Potassium: 3.7 mEq/L (ref 3.5–5.1)
Sodium: 139 mEq/L (ref 135–145)

## 2013-03-29 LAB — GLUCOSE, CAPILLARY

## 2013-03-29 NOTE — Progress Notes (Signed)
03/29/13 1113 nsg Pt to be discharged to home per wheelchair accompanied by NT and daughter. Understood discharge instructions.

## 2013-03-29 NOTE — Discharge Summary (Signed)
[facility]                                                                                   Wanda Robinson, is a 79 y.o. female  DOB 03/22/1929  MRN [id number].  Admission date:  03/27/2013  Admitting Physician  Therisa Doyne, MD  Discharge Date:  03/29/2013   Primary MD  Dois Davenport., MD  Admission Diagnosis  Gastric outlet obstruction [537.0]  Discharge Diagnosis     Active Problems:   HYPOTHYROIDISM   HYPERLIPIDEMIA   HYPERTENSION   CORONARY ARTERY DISEASE   Gastric outlet obstruction   Hypokalemia    Past Medical History  Diagnosis Date  . MI (myocardial infarction)   . Hypertension   . High cholesterol   . Ulcer   . Anemia   . Colon polyps   . Murmur, cardiac     Past Surgical History  Procedure Laterality Date  . Coronary artery bypass graft    . Cholecystectomy      open  . Carotid endarterectomy Left      Recommendations for primary care physician for things to follow:   Please have patient follow with general surgery and cardiology as outpatient.   Discharge Diagnoses:   Active Problems:   HYPOTHYROIDISM   HYPERLIPIDEMIA   HYPERTENSION   CORONARY ARTERY DISEASE   Gastric outlet obstruction   Hypokalemia    Discharge Condition: Stable   Follow-up Information   Follow up with [facility] MD. Schedule an appointment as soon as possible for a visit in 1 week.   Specialty:  Family Medicine   Contact information:   [address] [place] [address] [phone]       Follow up with [facility],MD, MD. Schedule an appointment as soon as possible for a visit in 1 week.   Specialty:  General Surgery   Contact information:   [address] [place] [address] [phone]       Follow up with Willa Rough, MD. Schedule an appointment as soon as possible for a visit in 1 week. (Heart murmur and preop evaluation)    Specialty:  Cardiology   Contact information:   [address] [place] [address] [phone]         Consults obtained - [facility]   Discharge Medications      Medication List         amLODipine 10 MG tablet  Commonly known as:  NORVASC  Take 10 mg by mouth daily.     aspirin EC 81 MG tablet  Take 81 mg by mouth daily.     ferrous sulfate 325 (65 FE) MG tablet  Take 325 mg by mouth 2 (two) times daily.     hydrochlorothiazide 25 MG tablet  Commonly known as:  HYDRODIURIL  Take 25 mg by mouth daily.     labetalol 200 MG tablet  Commonly known as:  NORMODYNE  Take 200 mg by mouth 2 (two) times daily.     levothyroxine 125 MCG tablet  Commonly known as:  SYNTHROID, LEVOTHROID  Take 125 mcg by mouth daily before breakfast.  naproxen sodium 220 MG tablet  Commonly known as:  ANAPROX  Take 220 mg by mouth daily.     pravastatin 80 MG tablet  Commonly known as:  PRAVACHOL  Take 80 mg by mouth daily.     Vitamin D-3 5000 UNITS Tabs  Take 5,000 Units by mouth daily.         Diet and Activity recommendation: See Discharge Instructions below   Discharge Instructions     Follow with Primary MD Dois Davenport., MD in 7 days   Get CBC, CMP, checked 7 days by Primary MD and again as instructed by your Primary MD. .  Get Medicines reviewed and adjusted.  Please request your Prim.MD to go over all Hospital Tests and Procedure/Radiological results at the follow up, please get all Hospital records sent to your Prim MD by signing hospital release before you go home.  Activity: As tolerated with Full fall precautions use walker/cane & assistance as needed   Diet:  Soft heart healthy diet advance as tolerated to regular consistency with aspiration precautions  For Heart failure patients - Check your Weight same time everyday, if you gain over 2 pounds, or you develop in leg swelling, experience more shortness of breath or chest pain, call your Primary MD immediately. Follow Cardiac Low Salt Diet and 1.8  lit/day fluid restriction.  Disposition Home   If you experience worsening of your admission symptoms, develop shortness of breath, life threatening emergency, suicidal or homicidal thoughts you must seek medical attention immediately by calling 911 or calling your MD immediately  if symptoms less severe.  You Must read complete instructions/literature along with all the possible adverse reactions/side effects for all the Medicines you take and that have been prescribed to you. Take any new Medicines after you have completely understood and accpet all the possible adverse reactions/side effects.   Do not drive and provide baby sitting services if your were admitted for syncope or siezures until you have seen by Primary MD or a Neurologist and advised to do so again.  Do not drive when taking Pain medications.    Do not take more than prescribed Pain, Sleep and Anxiety Medications  Special Instructions: If you have smoked or chewed Tobacco  in the last 2 yrs please stop smoking, stop any regular Alcohol  and or any Recreational drug use.  Wear Seat belts while driving.   Please note  You were cared for by a hospitalist during your hospital stay. If you have any questions about your discharge medications or the care you received while you were in the hospital after you are discharged, you can call the unit and asked to speak with the hospitalist on call if the hospitalist that took care of you is not available. Once you are discharged, your primary care physician will handle any further medical issues. Please note that NO REFILLS for any discharge medications will be authorized once you are discharged, as it is imperative that you return to your primary care physician (or establish a relationship with a primary care physician if you do not have one) for your aftercare needs so that they can reassess your need for medications and monitor your lab values.    Major procedures and Radiology  Reports - PLEASE review detailed and final reports for all details, in brief -       Dg Abd Acute W/chest  03/28/2013   CLINICAL DATA:  Nausea and vomiting.  EXAM: ACUTE ABDOMEN SERIES (ABDOMEN 2 VIEW &  CHEST 1 VIEW)  COMPARISON:  Previous exam performed earlier this same date.  FINDINGS: Normal bowel gas pattern. No free air.  There are dense aortoiliac and branch vessel calcifications. Two vascular stents are noted in the upper abdomen along the midline. The soft tissues are otherwise unremarkable.  The lungs are clear. The hiatal hernia smaller subsequent to placement of a nasogastric tube. The tube curls within the hiatal hernia and does not pass below the diaphragm.  IMPRESSION: No acute findings below the diaphragm. No evidence of a bowel obstruction, generalized ileus or free air.  Hiatal hernia is partly decompressed due to placement of a nasogastric tube, which curls within the hernia. It does not passed below the diaphragm.   Electronically Signed   By: Amie Portland M.D.   On: 03/28/2013 11:07   Dg Abd Acute W/chest  03/28/2013   CLINICAL DATA:  Epigastric pain radiating to the back. Nausea.  EXAM: ACUTE ABDOMEN SERIES (ABDOMEN 2 VIEW & CHEST 1 VIEW)  COMPARISON:  Chest radiographs dated 09/03/2009 and upper GI series dated 07/12/2004.  FINDINGS: A very large hiatal hernia is again demonstrated. This is significantly larger than seen on 09/03/2009. Mildly enlarged cardiac silhouette. Post CABG changes. Clear lungs.  Paucity of intestinal gas. No free peritoneal air. Air-fluid level in the large hiatal hernia. Ingested high density material in the left upper abdomen. Mild scoliosis. Upper abdominal vascular stents. Atheromatous arterial calcifications. Left humeral neck cyst.  IMPRESSION: 1. Significant increase in size of a large hiatal hernia, containing an air-fluid level, raising the possibility of gastric outlet obstruction. 2. Atheromatous arterial calcifications and upper abdominal  vascular stents.   Electronically Signed   By: Gordan Payment M.D.   On: 03/28/2013 00:38    Micro Results      No results found for this or any previous visit (from the past 240 hour(s)).   History of present illness and  Hospital Course:     Kindly see H&P for history of present illness and admission details, please review complete Labs, Consult reports and Test reports for all details in brief Wanda Robinson, is a 79 y.o. female, patient with history of  hypertension, MI, dyslipidemia, history of hiatal hernia presented to the hospital with 2-3 hour history of nausea vomiting and abdominal discomfort after having a heavy meal at a local [employer], she has had similar episodes multiple times last year, however these episode resolved spontaneously with bowel rest. In the ER workup was suggestive of large rectal hernia with distention and possible gastric outlet obstruction.  She was treated with supportive care with NG tube for decompression and bowel rest , he was seen by general surgery, with supportive care her symptoms resolved, she tolerated clear liquid diet and subsequently soft diet without any discomfort. She was seen again responding by general surgery who has cleared her for home discharge with outpatient followup with them .   Of note on physical exam patient has a loud systolic murmur, we'll request PCP to have patient follow with the cardiologist as outpatient both for evaluation of the murmur which most likely is Aortic stenosis/sclerosis and also for preop cardiopulmonary risk stratification as she might require her to hernia repair surgery in the future.    For her history of hypertension, hypothyroidism, dyslipidemia she will continue her home medications unchanged   TSH was 4.5 recommend repeating a TSH along with free T3 and T4 in 3-4 weeks.     Today   Subjective:  Wanda Robinson today has no headache,no chest abdominal pain,no new weakness tingling or numbness,  feels much better wants to go home today.    Objective:   Blood pressure 141/54, pulse 65, temperature 98.5 F (36.9 C), temperature source Oral, resp. rate 18, height 5\' 4"  (1.626 m), weight 62.9 kg (138 lb 10.7 oz), SpO2 96.00%.   Intake/Output Summary (Last 24 hours) at 03/29/13 0949 Last data filed at 03/29/13 0300  Gross per 24 hour  Intake 845.83 ml  Output      0 ml  Net 845.83 ml    Exam Awake Alert, Oriented *3, No new F.N deficits, Normal affect [place].AT,PERRAL Supple Neck,No JVD, No cervical lymphadenopathy appriciated.  Symmetrical Chest wall movement, Good air movement bilaterally, CTAB RRR,No Gallops,Rubs or new Murmurs, No Parasternal Heave +ve B.Sounds, Abd Soft, Non tender, No organomegaly appriciated, No rebound -guarding or rigidity. No Cyanosis, Clubbing or edema, No new Rash or bruise  Data Review   CBC w Diff: Lab Results  Component Value Date   WBC 6.7 03/27/2013   HGB 15.5* 03/27/2013   HCT 43.6 03/27/2013   PLT 210 03/27/2013   LYMPHOPCT 18 03/27/2013   MONOPCT 5 03/27/2013   EOSPCT 1 03/27/2013   BASOPCT 0 03/27/2013   Lab Results  Component Value Date   HGBA1C 5.7* 03/28/2013    Lab Results  Component Value Date   TSH 4.524* 03/28/2013    CMP: Lab Results  Component Value Date   NA 139 03/29/2013   K 3.7 03/29/2013   CL 105 03/29/2013   CO2 24 03/29/2013   BUN 12 03/29/2013   CREATININE 0.88 03/29/2013   PROT 7.3 03/27/2013   ALBUMIN 4.3 03/27/2013   BILITOT 0.8 03/27/2013   ALKPHOS 68 03/27/2013   AST 21 03/27/2013   ALT 17 03/27/2013  .   Total Time in preparing paper work, data evaluation and todays exam - 35 minutes  Leroy Sea M.D on 03/29/2013 at 9:49 AM  [facility] Office  [phone]

## 2013-03-29 NOTE — Progress Notes (Signed)
Patient ID: Wanda Robinson, female   DOB: 25-Jul-1928, 79 y.o.   MRN: [id number]    Subjective: Pt feels well today.  No pain or nausea.  Tolerating a solid diet  Objective: Vital signs in last 24 hours: Temp:  [98.4 F (36.9 C)-98.6 F (37 C)] 98.5 F (36.9 C) (10/12 0602) Pulse Rate:  [65-75] 65 (10/12 0602) Resp:  [18-20] 18 (10/12 0602) BP: (141-187)/(54-90) 141/54 mmHg (10/12 0602) SpO2:  [95 %-98 %] 96 % (10/12 0602) Last BM Date: 03/28/13  Intake/Output from previous day: 10/11 0701 - 10/12 0700 In: 845.8 [P.O.:120; I.V.:725.8] Out: 0  Intake/Output this shift:    PE: Abd: soft, NT, Nd, +BS  Lab Results:   Recent Labs  03/27/13 2320  WBC 6.7  HGB 15.5*  HCT 43.6  PLT 210   BMET  Recent Labs  03/27/13 2320 03/29/13 0530  NA 138 139  K 3.2* 3.7  CL 98 105  CO2 26 24  GLUCOSE 206* 89  BUN 20 12  CREATININE 1.10 0.88  CALCIUM 10.5 9.2   PT/INR No results found for this basename: LABPROT, INR,  in the last 72 hours CMP     Component Value Date/Time   NA 139 03/29/2013 0530   K 3.7 03/29/2013 0530   CL 105 03/29/2013 0530   CO2 24 03/29/2013 0530   GLUCOSE 89 03/29/2013 0530   BUN 12 03/29/2013 0530   CREATININE 0.88 03/29/2013 0530   CALCIUM 9.2 03/29/2013 0530   PROT 7.3 03/27/2013 2320   ALBUMIN 4.3 03/27/2013 2320   AST 21 03/27/2013 2320   ALT 17 03/27/2013 2320   ALKPHOS 68 03/27/2013 2320   BILITOT 0.8 03/27/2013 2320   GFRNONAA 60* 03/29/2013 0530   GFRAA 69* 03/29/2013 0530   Lipase     Component Value Date/Time   LIPASE 44 03/27/2013 2320       Studies/Results: Dg Abd Acute W/chest  03/28/2013   CLINICAL DATA:  Nausea and vomiting.  EXAM: ACUTE ABDOMEN SERIES (ABDOMEN 2 VIEW & CHEST 1 VIEW)  COMPARISON:  Previous exam performed earlier this same date.  FINDINGS: Normal bowel gas pattern. No free air.  There are dense aortoiliac and branch vessel calcifications. Two vascular stents are noted in the upper abdomen along the  midline. The soft tissues are otherwise unremarkable.  The lungs are clear. The hiatal hernia smaller subsequent to placement of a nasogastric tube. The tube curls within the hiatal hernia and does not pass below the diaphragm.  IMPRESSION: No acute findings below the diaphragm. No evidence of a bowel obstruction, generalized ileus or free air.  Hiatal hernia is partly decompressed due to placement of a nasogastric tube, which curls within the hernia. It does not passed below the diaphragm.   Electronically Signed   By: Amie Portland M.D.   On: 03/28/2013 11:07   Dg Abd Acute W/chest  03/28/2013   CLINICAL DATA:  Epigastric pain radiating to the back. Nausea.  EXAM: ACUTE ABDOMEN SERIES (ABDOMEN 2 VIEW & CHEST 1 VIEW)  COMPARISON:  Chest radiographs dated 09/03/2009 and upper GI series dated 07/12/2004.  FINDINGS: A very large hiatal hernia is again demonstrated. This is significantly larger than seen on 09/03/2009. Mildly enlarged cardiac silhouette. Post CABG changes. Clear lungs.  Paucity of intestinal gas. No free peritoneal air. Air-fluid level in the large hiatal hernia. Ingested high density material in the left upper abdomen. Mild scoliosis. Upper abdominal vascular stents. Atheromatous arterial calcifications. Left humeral neck cyst.  IMPRESSION: 1. Significant increase in size of a large hiatal hernia, containing an air-fluid level, raising the possibility of gastric outlet obstruction. 2. Atheromatous arterial calcifications and upper abdominal vascular stents.   Electronically Signed   By: Gordan Payment M.D.   On: 03/28/2013 00:38    Anti-infectives: Anti-infectives   None       Assessment/Plan  1. Hiatal hernia with gastric obstruction, resolved 2. Cardiac murmur  Plan: 1. Ok for [employer] home from our standpoint.  She will need evaluation by cardiology for clearance and then follow up with Dr. Derrell Lolling for discussion about hiatal hernia repair.   LOS: 2 days    Vita Currin  E 03/29/2013, 11:23 AM Pager: [phone]

## 2013-03-29 NOTE — Progress Notes (Signed)
Pt states she tolerated diet. No abd pain. No n/v  Ok for d/c F/u with Dr Lambert Keto. Andrey Campanile, MD, FACS General, Bariatric, & Minimally Invasive Surgery [facility], [place]

## 2013-03-31 ENCOUNTER — Ambulatory Visit (INDEPENDENT_AMBULATORY_CARE_PROVIDER_SITE_OTHER): Payer: Medicare Other | Admitting: Cardiology

## 2013-03-31 ENCOUNTER — Encounter: Payer: Self-pay | Admitting: Cardiology

## 2013-03-31 VITALS — BP 120/88 | HR 61 | Ht 65.0 in | Wt 138.0 lb

## 2013-03-31 DIAGNOSIS — E785 Hyperlipidemia, unspecified: Secondary | ICD-10-CM

## 2013-03-31 DIAGNOSIS — R011 Cardiac murmur, unspecified: Secondary | ICD-10-CM

## 2013-03-31 DIAGNOSIS — I10 Essential (primary) hypertension: Secondary | ICD-10-CM

## 2013-03-31 DIAGNOSIS — I2510 Atherosclerotic heart disease of native coronary artery without angina pectoris: Secondary | ICD-10-CM

## 2013-03-31 NOTE — Patient Instructions (Signed)
Continue your current therapy  We will schedule you for a nuclear stress test and an echocardiogram

## 2013-03-31 NOTE — Progress Notes (Signed)
Wanda Robinson Date of Birth: 02-13-1929 Medical Record #[id number]  History of Present Illness: Wanda Robinson is seen at the request of Dr. Hal Hope for cardiac evaluation. She is a pleasant 79 year old white female who has a long history of cardiac disease. She is seen with her daughter today. She has a remote history of myocardial infarction. In 2002 she was admitted with unstable angina and found to have three-vessel obstructive coronary disease. On 01/09/2001 she underwent coronary bypass surgery x4 with an LIMA graft to the LAD, saphenous vein graft to the ramus intermediate, saphenous vein graft to the OM 1, and saphenous vein graft to the PDA. This was done by Dr. Dorris Fetch. She has had stenting of bilateral renal arteries in 2007. In 2011 showed a left carotid endarterectomy. She has a history of cardiac murmur but no apparent echocardiogram. Recently she was hospitalized with gastric outlet obstruction related to a large hiatal hernia. She is being considered for surgical management. From a cardiac standpoint she denies any current symptoms of chest pain, shortness of breath, or palpitations. She has no history of dizziness or syncope. She denies any history of arrhythmia.  Current Outpatient Prescriptions on File Prior to Visit  Medication Sig Dispense Refill  . amLODipine (NORVASC) 10 MG tablet Take 10 mg by mouth daily.      Marland Kitchen aspirin EC 81 MG tablet Take 81 mg by mouth daily.      . Cholecalciferol (VITAMIN D-3) 5000 UNITS TABS Take 5,000 Units by mouth daily.      . ferrous sulfate 325 (65 FE) MG tablet Take 325 mg by mouth 2 (two) times daily.      . hydrochlorothiazide (HYDRODIURIL) 25 MG tablet Take 25 mg by mouth daily.      Marland Kitchen labetalol (NORMODYNE) 200 MG tablet Take 200 mg by mouth 2 (two) times daily.      Marland Kitchen levothyroxine (SYNTHROID, LEVOTHROID) 125 MCG tablet Take 125 mcg by mouth daily before breakfast.      . naproxen sodium (ANAPROX) 220 MG tablet Take 220 mg by mouth as  needed.       . pravastatin (PRAVACHOL) 80 MG tablet Take 80 mg by mouth daily.       No current facility-administered medications on file prior to visit.    Allergies  Allergen Reactions  . Codeine   . Codeine Phosphate Nausea Only    Past Medical History  Diagnosis Date  . MI (myocardial infarction)   . Hypertension   . High cholesterol   . Ulcer   . Anemia   . Colon polyps   . Murmur, cardiac   . CAD (coronary artery disease)   . Gastric outlet obstruction   . Hiatal hernia     Past Surgical History  Procedure Laterality Date  . Coronary artery bypass graft  2002    Dr Dorris Fetch  . Cholecystectomy      open  . Carotid endarterectomy Left 2011    Dr. Darrick Penna    History  Smoking status  . Former Smoker  Smokeless tobacco  . Never Used    History  Alcohol Use No    Family History  Problem Relation Age of Onset  . Heart disease Father     Review of Systems: The review of systems is positive for recent hospitalization as noted. She denies any recurrent abdominal pain, nausea, or vomiting. All other systems were reviewed and are negative.  Physical Exam: BP 120/88  Pulse 61  Ht 5\' 5"  (  1.651 m)  Wt 138 lb (62.596 kg)  BMI 22.96 kg/m2 She is a pleasant elderly white female in no acute distress. HEENT: Normocephalic, atraumatic. Pupils around and reactive. Sclera clear. Oropharynx is clear. Neck is without JVD, adenopathy, thyromegaly, or bruits. Carotid upstrokes are normal. Lungs: Clear Cardiovascular: Regular rate and rhythm. Normal S1 and S2. There is a harsh grade 2/6 systolic murmur heard best at the left upper sternal border rating to the left lower sternal border. There is no diastolic murmur or S3. Abdomen: Soft and nontender. No hepatosplenomegaly or masses. Extremities are without cyanosis or edema. Pulses 2+ and symmetric. Alert and oriented x3. Cranial nerves II through XII are intact. Skin: Warm and dry  LABORATORY DATA: ECG today  demonstrates sinus rhythm with first-degree AV block. There is rightward axis. T wave animality consistent with inferior lateral ischemia.  Assessment / Plan: 1. Coronary disease with remote CABG in 2002. No clear anginal symptoms. Patient is sedentary. To further stratify her risk for surgery we will schedule her for a lexiscan Myoview study.  2. Cardiac murmur. We'll obtain an echocardiogram to further elucidate.  3. CAD status post bilateral renal artery stenting.  4. Status post left carotid endarterectomy.  5. History of gastric outlet obstruction secondary to hiatal hernia.   6. Chronic kidney disease.  7. Hypertension  A. Hyperlipidemia.

## 2013-04-07 ENCOUNTER — Ambulatory Visit (HOSPITAL_COMMUNITY): Payer: Medicare Other | Attending: Cardiology | Admitting: Cardiology

## 2013-04-07 DIAGNOSIS — I080 Rheumatic disorders of both mitral and aortic valves: Secondary | ICD-10-CM | POA: Insufficient documentation

## 2013-04-07 DIAGNOSIS — R011 Cardiac murmur, unspecified: Secondary | ICD-10-CM | POA: Insufficient documentation

## 2013-04-07 DIAGNOSIS — I379 Nonrheumatic pulmonary valve disorder, unspecified: Secondary | ICD-10-CM | POA: Insufficient documentation

## 2013-04-07 DIAGNOSIS — I252 Old myocardial infarction: Secondary | ICD-10-CM | POA: Insufficient documentation

## 2013-04-07 DIAGNOSIS — I10 Essential (primary) hypertension: Secondary | ICD-10-CM | POA: Insufficient documentation

## 2013-04-07 DIAGNOSIS — I319 Disease of pericardium, unspecified: Secondary | ICD-10-CM | POA: Insufficient documentation

## 2013-04-07 DIAGNOSIS — I2510 Atherosclerotic heart disease of native coronary artery without angina pectoris: Secondary | ICD-10-CM | POA: Insufficient documentation

## 2013-04-07 DIAGNOSIS — I079 Rheumatic tricuspid valve disease, unspecified: Secondary | ICD-10-CM | POA: Insufficient documentation

## 2013-04-07 DIAGNOSIS — E785 Hyperlipidemia, unspecified: Secondary | ICD-10-CM

## 2013-04-07 NOTE — Progress Notes (Signed)
Echo performed. 

## 2013-04-08 ENCOUNTER — Ambulatory Visit (HOSPITAL_COMMUNITY): Payer: Medicare Other | Attending: Cardiology | Admitting: Radiology

## 2013-04-08 VITALS — BP 129/62 | HR 55 | Ht 65.0 in | Wt 136.0 lb

## 2013-04-08 DIAGNOSIS — Z951 Presence of aortocoronary bypass graft: Secondary | ICD-10-CM | POA: Insufficient documentation

## 2013-04-08 DIAGNOSIS — E785 Hyperlipidemia, unspecified: Secondary | ICD-10-CM

## 2013-04-08 DIAGNOSIS — I779 Disorder of arteries and arterioles, unspecified: Secondary | ICD-10-CM | POA: Insufficient documentation

## 2013-04-08 DIAGNOSIS — I10 Essential (primary) hypertension: Secondary | ICD-10-CM | POA: Insufficient documentation

## 2013-04-08 DIAGNOSIS — I252 Old myocardial infarction: Secondary | ICD-10-CM | POA: Insufficient documentation

## 2013-04-08 DIAGNOSIS — R011 Cardiac murmur, unspecified: Secondary | ICD-10-CM

## 2013-04-08 DIAGNOSIS — I2510 Atherosclerotic heart disease of native coronary artery without angina pectoris: Secondary | ICD-10-CM

## 2013-04-08 DIAGNOSIS — Z87891 Personal history of nicotine dependence: Secondary | ICD-10-CM | POA: Insufficient documentation

## 2013-04-08 DIAGNOSIS — Z01810 Encounter for preprocedural cardiovascular examination: Secondary | ICD-10-CM

## 2013-04-08 DIAGNOSIS — I739 Peripheral vascular disease, unspecified: Secondary | ICD-10-CM | POA: Insufficient documentation

## 2013-04-08 DIAGNOSIS — R9439 Abnormal result of other cardiovascular function study: Secondary | ICD-10-CM | POA: Insufficient documentation

## 2013-04-08 MED ORDER — TECHNETIUM TC 99M SESTAMIBI GENERIC - CARDIOLITE
10.0000 | Freq: Once | INTRAVENOUS | Status: AC | PRN
  Administered 2013-04-08: 10 via INTRAVENOUS

## 2013-04-08 MED ORDER — TECHNETIUM TC 99M SESTAMIBI GENERIC - CARDIOLITE
30.0000 | Freq: Once | INTRAVENOUS | Status: AC | PRN
  Administered 2013-04-08: 30 via INTRAVENOUS

## 2013-04-08 MED ORDER — REGADENOSON 0.4 MG/5ML IV SOLN
0.4000 mg | Freq: Once | INTRAVENOUS | Status: AC
  Administered 2013-04-08: 0.4 mg via INTRAVENOUS

## 2013-04-08 NOTE — Progress Notes (Signed)
[facility] SITE 3 NUCLEAR MED [address] [place], [place] [address] [phone]    Cardiology Nuclear Med Study  Wanda Robinson is a 79 y.o. female     MRN : [id number]     DOB: 1929-01-02  Procedure Date: 04/08/2013  Nuclear Med Background Indication for Stress Test:  Evaluation for Ischemia and Pending Surgical Clearance for Hernia Repair by  Dr. Derrell Lolling History:  History of MI, CABG Cardiac Risk Factors: Carotid Disease, History of Smoking, Hypertension, Lipids and PVD  Symptoms:  No cardiac symptoms noted today.   Nuclear Pre-Procedure Caffeine/Decaff Intake:  None NPO After: 7:00pm   Lungs:  clear O2 Sat: 99% on room air. IV 0.9% NS with Angio Cath:  24g  IV Site: L Hand  IV Started by:  Bonnita Levan, RN  Chest Size (in):  38 Cup Size: C  Height: 5\' 5"  (1.651 m)  Weight:  136 lb (61.689 kg)  BMI:  Body mass index is 22.63 kg/(m^2). Tech Comments:  N/A    Nuclear Med Study 1 or 2 day study: 1 day  Stress Test Type:  Lexiscan  Reading MD: Willa Rough, MD  Order Authorizing Provider:  Peter Swaziland, MD  Resting Radionuclide: Technetium 21m Sestamibi  Resting Radionuclide Dose: 10.5 mCi   Stress Radionuclide:  Technetium 72m Sestamibi  Stress Radionuclide Dose: 32.8 mCi           Stress Protocol Rest HR: 55 Stress HR: 75  Rest BP: 129/62 Stress BP: 137/68  Exercise Time (min): n/a METS: n/a   Predicted Max HR: 138 bpm % Max HR: 54.35 bpm Rate Pressure Product: 64403   Dose of Adenosine (mg):  n/a Dose of Lexiscan: 0.4 mg  Dose of Atropine (mg): n/a Dose of Dobutamine: n/a mcg/kg/min (at max HR)  Stress Test Technologist: Smiley Houseman, CMA-N  Nuclear Technologist:  Dario Guardian, CNMT     Rest Procedure:  Myocardial perfusion imaging was performed at rest 45 minutes following the intravenous administration of Technetium 75m Sestamibi.  Rest ECG: Nonspecific ST-T wave abnormalities  Stress Procedure:  The patient received IV Lexiscan 0.4 mg over  15-seconds.  Technetium 67m Sestamibi injected at 30-seconds.  She c/o lightheadedness with Lexiscan.  Quantitative spect images were obtained after a 45 minute delay.  Stress ECG: No significant change from baseline ECG  QPS Raw Data Images:  Patient motion noted; appropriate software correction applied. Stress Images:  There is a large area with moderate decreased uptake affecting the mid/apical anterior segments, apical inferior segment, apical lateral segment, and the apical cap. This area is mixed Rest Images:  Medium area with moderate decreased uptake affecting the mid/apical anterior segments, apical lateral segment, and the apical cap. Subtraction (SDS):  Mild ischemia in the apical cap, mid/apical anterior segments, and apical inferior segment. Transient Ischemic Dilatation (Normal <1.22):  1.02 Lung/Heart Ratio (Normal <0.45):  0.25  Quantitative Gated Spect Images QGS EDV:  69 ml QGS ESV:  24 ml  Impression Exercise Capacity:  Lexiscan with no exercise. BP Response:  Normal blood pressure response. Clinical Symptoms:  Patient felt different ECG Impression:  No significant ST segment change suggestive of ischemia. Comparison with Prior Nuclear Study: No previous nuclear study performed  Overall Impression:  There is a moderate size defect in the anteroapical wall with some reversibility. It is possible that this could be breast attenuation that is shifting between rest and stress images. Normal wall motion makes this option more of a possibility. However I  cannot rule out anterior scar with peri-infarct ischemia.  LV Ejection Fraction: 66%.  LV Wall Motion:  Normal Wall Motion.  Willa Rough

## 2013-04-14 ENCOUNTER — Telehealth (INDEPENDENT_AMBULATORY_CARE_PROVIDER_SITE_OTHER): Payer: Self-pay

## 2013-04-14 ENCOUNTER — Ambulatory Visit (INDEPENDENT_AMBULATORY_CARE_PROVIDER_SITE_OTHER): Payer: Medicare Other | Admitting: General Surgery

## 2013-04-14 ENCOUNTER — Encounter (INDEPENDENT_AMBULATORY_CARE_PROVIDER_SITE_OTHER): Payer: Self-pay

## 2013-04-14 ENCOUNTER — Other Ambulatory Visit (INDEPENDENT_AMBULATORY_CARE_PROVIDER_SITE_OTHER): Payer: Self-pay

## 2013-04-14 ENCOUNTER — Encounter (INDEPENDENT_AMBULATORY_CARE_PROVIDER_SITE_OTHER): Payer: Self-pay | Admitting: General Surgery

## 2013-04-14 VITALS — BP 124/72 | HR 66 | Temp 97.8°F | Resp 14 | Ht 61.0 in | Wt 137.0 lb

## 2013-04-14 DIAGNOSIS — K449 Diaphragmatic hernia without obstruction or gangrene: Secondary | ICD-10-CM

## 2013-04-14 NOTE — Progress Notes (Signed)
Subjective:     Patient ID: Wanda Robinson, female   DOB: 1928/06/26, 79 y.o.   MRN: [id number]  HPI The patient is an 79 year old female who is recently hospitalized secondary to gastric obstruction. Patient has a known hiatal hernia with herniated stomach into the right chest. This was hospitalized secondary to pain and obstruction. This resolved after NG tube placement conservative treatment.   Patient is known aboutHer hiatal hernia for 2-3 years. This was her first episode that required hospitalization. Subsequently discharged patient had no issues.  The patient comes in today with her daughter who is her POA.   Past Medical History  Diagnosis Date  . MI (myocardial infarction)   . Hypertension   . High cholesterol   . Ulcer   . Anemia   . Colon polyps   . Murmur, cardiac   . CAD (coronary artery disease)   . Gastric outlet obstruction   . Hiatal hernia    Past Surgical History  Procedure Laterality Date  . Coronary artery bypass graft  2002    Dr Dorris Fetch  . Cholecystectomy      open  . Carotid endarterectomy Left 2011    Dr. Darrick Penna   Allergies  Allergen Reactions  . Codeine   . Codeine Phosphate Nausea Only   Current Outpatient Prescriptions on File Prior to Visit  Medication Sig Dispense Refill  . amLODipine (NORVASC) 10 MG tablet Take 10 mg by mouth daily.      Marland Kitchen aspirin EC 81 MG tablet Take 81 mg by mouth daily.      . Cholecalciferol (VITAMIN D-3) 5000 UNITS TABS Take 5,000 Units by mouth daily.      . ferrous sulfate 325 (65 FE) MG tablet Take 325 mg by mouth 2 (two) times daily.      . hydrochlorothiazide (HYDRODIURIL) 25 MG tablet Take 25 mg by mouth daily.      Marland Kitchen labetalol (NORMODYNE) 200 MG tablet Take 200 mg by mouth 2 (two) times daily.      Marland Kitchen levothyroxine (SYNTHROID, LEVOTHROID) 125 MCG tablet Take 125 mcg by mouth daily before breakfast.      . naproxen sodium (ANAPROX) 220 MG tablet Take 220 mg by mouth as needed.       . pravastatin (PRAVACHOL)  80 MG tablet Take 80 mg by mouth daily.      . Probiotic Product (PROBIOTIC DAILY PO) Take 1 tablet by mouth daily.       No current facility-administered medications on file prior to visit.     Review of Systems  Constitutional: Negative.   HENT: Negative.   Respiratory: Negative.   Cardiovascular: Negative.   Gastrointestinal: Negative.   Neurological: Negative.   All other systems reviewed and are negative.       Objective:   Physical Exam  Vitals reviewed. Constitutional: She is oriented to person, place, and time. She appears well-developed and well-nourished.  HENT:  Head: Normocephalic and atraumatic.  Eyes: Conjunctivae and EOM are normal. Pupils are equal, round, and reactive to light.  Neck: Normal range of motion. Neck supple.  Cardiovascular: Normal rate and normal heart sounds.   Pulmonary/Chest: Effort normal and breath sounds normal.  Abdominal: Soft. Bowel sounds are normal.  Musculoskeletal: Normal range of motion.  Neurological: She is alert and oriented to person, place, and time.  Skin: Skin is warm and dry.       Assessment:     A 79 year old female with a Hiatal hernia  Plan:     1I discussed the surgery with the patient and her daughter who is her POA about surgical repair. They have decided to proceed with laparoscopic hernia repair and G-tube placement. 2. Discussed with them the risks and benefits of the surgery to include infection, bleeding, damage to surrounding structures to include esophageal leak, the need for further surgery and her procedures, possible fluid accumulation within the chest, possible need for chest tube placement, and any other subsequent need procedures. The patient and her daughter voiced understanding and wished to proceed with the operation. 3. The patient sees Dr. Swaziland as her cardiologist. We will need a letter of cardiac evaluation prior schedule her surgery. 4. Discussed with her daughter the need for her to  supplement her diet with Ensure. Her albumin is 4.3 today would like to be stable prior surgery.

## 2013-04-14 NOTE — Telephone Encounter (Signed)
Message     She is cleared for her surgery from a cardiac standpoint.        Peter Swaziland MD, [facility]            ----- Message -----    From: Maryan Puls, CMA    Sent: 04/14/2013 12:00 PM    To: Peter M Swaziland, MD                         Wardell Heath Description: 79 year old female  04/14/2013 Letter (Out) Provider: Maryan Puls, CMA  MRN: [id number] Department: [facility]         Clinical Letter Summary         Letters    Letter Information      Status    Maryan Puls on 04/14/2013 Documentation: Pre-Op Cardiac Clearance Sent             Patient Demographics    Address Phone   [address] [place] [address] [phone] (Home)

## 2013-04-14 NOTE — Telephone Encounter (Signed)
Patient just left office, called to give patient's POA oral contrast for CT Scan that will be scheduled.  Contrast is at the front desk for pick up.  Please make Wanda Robinson aware I will call with directions once CT is scheduled.

## 2013-04-15 ENCOUNTER — Telehealth (INDEPENDENT_AMBULATORY_CARE_PROVIDER_SITE_OTHER): Payer: Self-pay | Admitting: *Deleted

## 2013-04-15 NOTE — Telephone Encounter (Signed)
I spoke with Wanda Robinson to inform her of pts appt for CT scan at [facility] on 04/20/13 with an arrival time of 10:45.  I instructed [name] for pt to drink 1st bottle of contrast at 9:00am and 2nd bottle at 10:00am.  No solid foods 4 hours prior to scan.  [name] agreeable to this appt.

## 2013-04-16 ENCOUNTER — Telehealth (INDEPENDENT_AMBULATORY_CARE_PROVIDER_SITE_OTHER): Payer: Self-pay

## 2013-04-16 NOTE — Telephone Encounter (Signed)
Called and left message for patient's POA Kennyth Arnold) to call our office RE: pre-op prep, Dr. Derrell Lolling wants patient to drink two (2) bottles of magnesium citrate the day before surgery.

## 2013-04-16 NOTE — Telephone Encounter (Signed)
Spoke to [employer] [facility]) made aware that Kizzie will need to drink two (2) bottles of magnesium citrate that day before surgery.  [name] verbalized understanding and agrees with plan as above.

## 2013-04-20 ENCOUNTER — Ambulatory Visit
Admission: RE | Admit: 2013-04-20 | Discharge: 2013-04-20 | Disposition: A | Discharge status: Home / Self Care | Payer: Medicare Other | Source: Ambulatory Visit | Attending: General Surgery | Admitting: General Surgery

## 2013-04-20 DIAGNOSIS — K449 Diaphragmatic hernia without obstruction or gangrene: Secondary | ICD-10-CM

## 2013-04-20 NOTE — Telephone Encounter (Signed)
[facility]called to inform us her mother was not able to tolerate the contrast , she vomited after drinking the contrast and continued to vomit after the CT this am . Patient was able to eat lunch and tolerated well. I advised her to call if patient has anymore vomiting or if she has concerns. POA verbalized understanding

## 2013-05-01 ENCOUNTER — Telehealth (INDEPENDENT_AMBULATORY_CARE_PROVIDER_SITE_OTHER): Payer: Self-pay

## 2013-05-01 ENCOUNTER — Encounter (HOSPITAL_COMMUNITY): Payer: Self-pay | Admitting: Pharmacy Technician

## 2013-05-01 NOTE — Telephone Encounter (Signed)
Rec'd message to call patient's daughter Lynden Ang) due to [employer] I am unable to discuss patient surgery or give any information to [place].  I called patient's POA ([name]) and made her aware that I am unable to discuss any information regarding the patient with any family member or anyone's that's not on patient's HIPPA Release.  Kennyth Arnold did states that she would like for her sister Lynden Ang) to hear the risks of her mother's surgery and discuss surgery in detail.  Orvan Seen that I will send a message to Dr. Derrell Lolling and see his preference on patient needing an office visit with family members present or if Dr. Derrell Lolling will call and discuss the concerns with [name].  Will  Send message to Dr. Derrell Lolling and await a response and call [name] to advise accordingly.

## 2013-05-01 NOTE — Telephone Encounter (Signed)
Message copied by Maryan Puls on Fri May 01, 2013  1:43 PM ------      Message from: Leanne Chang      Created: Fri May 01, 2013  9:01 AM      Regarding: Dr Derrell Lolling      Contact: [phone]       Waymon Budge (daughter) of patient says she would like to speak with you about mother's sx. Patient doesn't want to have it now and the family is upset. ------

## 2013-05-01 NOTE — Telephone Encounter (Signed)
If they want to rediscuss the surgery we can, we just need them all to come back to clinic to answer and questions.  Id allow for the visit.

## 2013-05-04 ENCOUNTER — Telehealth (INDEPENDENT_AMBULATORY_CARE_PROVIDER_SITE_OTHER): Payer: Self-pay

## 2013-05-04 NOTE — Telephone Encounter (Addendum)
Called and spoke to [facility] regarding appointment to discuss surgery with patient and family members.  Kennyth Arnold will call our office back and let me know if we need to make an appointment this week to discuss surgery in greater detail along with risks.  Will await call from [name] to our office.

## 2013-05-05 ENCOUNTER — Encounter (HOSPITAL_COMMUNITY)
Admission: RE | Admit: 2013-05-05 | Discharge: 2013-05-05 | Disposition: A | Discharge status: Home / Self Care | Payer: Medicare Other | Source: Ambulatory Visit | Attending: General Surgery | Admitting: General Surgery

## 2013-05-05 ENCOUNTER — Encounter (HOSPITAL_COMMUNITY): Payer: Self-pay

## 2013-05-05 ENCOUNTER — Encounter (INDEPENDENT_AMBULATORY_CARE_PROVIDER_SITE_OTHER): Payer: Self-pay

## 2013-05-05 DIAGNOSIS — Z01812 Encounter for preprocedural laboratory examination: Secondary | ICD-10-CM | POA: Insufficient documentation

## 2013-05-05 DIAGNOSIS — N189 Chronic kidney disease, unspecified: Secondary | ICD-10-CM

## 2013-05-05 DIAGNOSIS — Z01818 Encounter for other preprocedural examination: Secondary | ICD-10-CM | POA: Insufficient documentation

## 2013-05-05 DIAGNOSIS — F0390 Unspecified dementia without behavioral disturbance: Secondary | ICD-10-CM

## 2013-05-05 HISTORY — DX: Unspecified dementia, unspecified severity, without behavioral disturbance, psychotic disturbance, mood disturbance, and anxiety: F03.90

## 2013-05-05 HISTORY — DX: Chronic kidney disease, unspecified: N18.9

## 2013-05-05 HISTORY — DX: Hypothyroidism, unspecified: E03.9

## 2013-05-05 HISTORY — DX: Unspecified dementia without behavioral disturbance: F03.90

## 2013-05-05 LAB — CBC
HCT: 43.4 % (ref 36.0–46.0)
Hemoglobin: 15.1 g/dL — ABNORMAL HIGH (ref 12.0–15.0)
MCV: 88.6 fL (ref 78.0–100.0)
RBC: 4.9 MIL/uL (ref 3.87–5.11)
RDW: 13.6 % (ref 11.5–15.5)
WBC: 5.2 10*3/uL (ref 4.0–10.5)

## 2013-05-05 LAB — BASIC METABOLIC PANEL
BUN: 17 mg/dL (ref 6–23)
CO2: 28 mEq/L (ref 19–32)
Chloride: 101 mEq/L (ref 96–112)
Creatinine, Ser: 1.07 mg/dL (ref 0.50–1.10)
GFR calc Af Amer: 54 mL/min — ABNORMAL LOW (ref >90)
Glucose, Bld: 96 mg/dL (ref 70–99)
Potassium: 4 mEq/L (ref 3.5–5.1)

## 2013-05-05 NOTE — Patient Instructions (Addendum)
[address] OCTA UPLINGER  05/05/2013   Your procedure is scheduled on:  11-25 -2014  Report to [facility] at     0530   AM .  Call this number if you have problems the morning of surgery: [phone]  Or Presurgical Testing [phone](Khris Jansson)   Remember: Follow any bowel prep instructions per MD office.    Do not eat food:After Midnight.    Take these medicines the morning of surgery with A SIP OF WATER: Amlodipine. Labetalol. Levothyroxine. Pravastatin   Do not wear jewelry, make-up or nail polish.  Do not wear lotions, powders, or perfumes. You may wear deodorant.  Do not shave 12 hours prior to first CHG shower(legs and under arms).(face and neck okay.)  Do not bring valuables to the hospital.  Contacts, dentures or removable bridgework, body piercing, hair pins may not be worn into surgery.  Leave suitcase in the car. After surgery it may be brought to your room.  For patients admitted to the hospital, checkout time is 11:00 AM the day of discharge.   Patients discharged the day of surgery will not be allowed to drive home. Must have responsible person with you x 24 hours once discharged.  Name and phone number of your driver: Waymon Budge.daughter [phone] cell  Special Instructions: CHG(Chlorhedine 4%-"Hibiclens","Betasept","Aplicare") Shower Use Special Wash: see special instructions.(avoid face and genitals)      Failure to follow these instructions may result in Cancellation of your surgery.   Patient signature_______________________________________________________

## 2013-05-05 NOTE — Pre-Procedure Instructions (Addendum)
05-05-13 EKG, Echo/ CXR 10'14- Epic. Cardiac Clearance note -Epic. Per Dr. Swaziland. 05-05-13 Hiram Comber ,daughter-POA with patient during visit. W. Kennon Portela

## 2013-05-05 NOTE — Telephone Encounter (Signed)
Called and spoke to [name] regarding appointment to discuss surgery with patient and family members.  [name] will call our office back and let me know if we need to make an appointment this week to discuss surgery in greater detail along with risks.  Will await call from [name] to our office. 

## 2013-05-08 ENCOUNTER — Encounter (INDEPENDENT_AMBULATORY_CARE_PROVIDER_SITE_OTHER): Payer: Self-pay

## 2013-05-08 ENCOUNTER — Encounter (INDEPENDENT_AMBULATORY_CARE_PROVIDER_SITE_OTHER): Payer: Medicare Other | Admitting: General Surgery

## 2013-05-11 NOTE — Telephone Encounter (Signed)
Patient's daughter Kennyth Arnold called to report the pt will be at surgery as planned tomorrow.

## 2013-05-11 NOTE — Anesthesia Preprocedure Evaluation (Addendum)
Anesthesia Evaluation  Patient identified by MRN, date of birth, ID band Patient awake    Reviewed: Allergy & Precautions, H&P , NPO status , Patient's Chart, lab work & pertinent test results  Airway Mallampati: II TM Distance: >3 FB Neck ROM: Full    Dental  (+) Dental Advisory Given and Teeth Intact   Pulmonary shortness of breath, asthma , COPDformer smoker,  breath sounds clear to auscultation        Cardiovascular hypertension, Pt. on home beta blockers and Pt. on medications + CAD and + Past MI + Valvular Problems/Murmurs AS and AI Rhythm:Regular Rate:Normal  Echo 03/2013 - Left ventricle: The cavity size was normal. Wall thickness  was increased in a pattern of mild LVH. Systolic function  was normal. The estimated ejection fraction was in the  range of 55% to 60%. Wall motion was normal; there were no  regional wall motion abnormalities. Doppler parameters are  consistent with abnormal left ventricular relaxation  (grade 1 diastolic dysfunction). Doppler parameters are  consistent with high ventricular filling pressure. - Aortic valve: Valve mobility was restricted. There was   mild stenosis. Mild regurgitation. - Mitral valve: Calcified annulus. - Pulmonary arteries: Systolic pressure was mildly   increased. PA peak pressure: 42mm Hg (S). - Pericardium, extracardiac: A trivial pericardial effusion   was identified.    Neuro/Psych PSYCHIATRIC DISORDERS negative neurological ROS     GI/Hepatic Neg liver ROS, hiatal hernia, GERD-  ,  Endo/Other  Hypothyroidism   Renal/GU Renal disease     Musculoskeletal negative musculoskeletal ROS (+)   Abdominal   Peds  Hematology  (+) anemia ,   Anesthesia Other Findings   Reproductive/Obstetrics negative OB ROS                         Anesthesia Physical Anesthesia Plan  ASA: III  Anesthesia Plan: General   Post-op Pain Management:     Induction: Intravenous  Airway Management Planned: Oral ETT  Additional Equipment:   Intra-op Plan:   Post-operative Plan: Extubation in OR  Informed Consent: I have reviewed the patients History and Physical, chart, labs and discussed the procedure including the risks, benefits and alternatives for the proposed anesthesia with the patient or authorized representative who has indicated his/her understanding and acceptance.   Dental advisory given  Plan Discussed with: CRNA  Anesthesia Plan Comments:         Anesthesia Quick Evaluation

## 2013-05-11 NOTE — Telephone Encounter (Signed)
Called and spoke to [facility]) for patient.  Patient is scheduled for surgery on 05/12/13 as planned, per [facility]) all questions have been answered.

## 2013-05-12 ENCOUNTER — Emergency Department (HOSPITAL_COMMUNITY): Payer: Medicare Other

## 2013-05-12 ENCOUNTER — Observation Stay (HOSPITAL_COMMUNITY)
Admission: EM | Admit: 2013-05-12 | Discharge: 2013-05-15 | Disposition: A | Discharge status: Home / Self Care | Payer: Medicare Other | Attending: General Surgery | Admitting: General Surgery

## 2013-05-12 ENCOUNTER — Encounter (HOSPITAL_COMMUNITY)
Admission: EM | Disposition: A | Discharge status: Home / Self Care | Payer: Self-pay | Source: Home / Self Care | Attending: General Surgery

## 2013-05-12 ENCOUNTER — Encounter (HOSPITAL_COMMUNITY): Payer: Self-pay | Admitting: Emergency Medicine

## 2013-05-12 ENCOUNTER — Other Ambulatory Visit: Payer: Self-pay

## 2013-05-12 ENCOUNTER — Inpatient Hospital Stay (HOSPITAL_COMMUNITY): Admission: RE | Admit: 2013-05-12 | Payer: Medicare Other | Source: Ambulatory Visit | Admitting: General Surgery

## 2013-05-12 ENCOUNTER — Inpatient Hospital Stay (HOSPITAL_COMMUNITY): Payer: Medicare Other | Admitting: Anesthesiology

## 2013-05-12 ENCOUNTER — Encounter (HOSPITAL_COMMUNITY): Payer: Medicare Other | Admitting: Anesthesiology

## 2013-05-12 DIAGNOSIS — I10 Essential (primary) hypertension: Secondary | ICD-10-CM

## 2013-05-12 DIAGNOSIS — Z79899 Other long term (current) drug therapy: Secondary | ICD-10-CM | POA: Insufficient documentation

## 2013-05-12 DIAGNOSIS — K449 Diaphragmatic hernia without obstruction or gangrene: Secondary | ICD-10-CM

## 2013-05-12 DIAGNOSIS — K469 Unspecified abdominal hernia without obstruction or gangrene: Secondary | ICD-10-CM

## 2013-05-12 DIAGNOSIS — E039 Hypothyroidism, unspecified: Secondary | ICD-10-CM | POA: Insufficient documentation

## 2013-05-12 DIAGNOSIS — R404 Transient alteration of awareness: Secondary | ICD-10-CM | POA: Insufficient documentation

## 2013-05-12 DIAGNOSIS — Z9089 Acquired absence of other organs: Secondary | ICD-10-CM | POA: Insufficient documentation

## 2013-05-12 DIAGNOSIS — Z951 Presence of aortocoronary bypass graft: Secondary | ICD-10-CM | POA: Insufficient documentation

## 2013-05-12 DIAGNOSIS — I080 Rheumatic disorders of both mitral and aortic valves: Secondary | ICD-10-CM | POA: Insufficient documentation

## 2013-05-12 DIAGNOSIS — E785 Hyperlipidemia, unspecified: Secondary | ICD-10-CM

## 2013-05-12 DIAGNOSIS — I129 Hypertensive chronic kidney disease with stage 1 through stage 4 chronic kidney disease, or unspecified chronic kidney disease: Secondary | ICD-10-CM | POA: Insufficient documentation

## 2013-05-12 DIAGNOSIS — I517 Cardiomegaly: Secondary | ICD-10-CM | POA: Insufficient documentation

## 2013-05-12 DIAGNOSIS — N189 Chronic kidney disease, unspecified: Secondary | ICD-10-CM | POA: Insufficient documentation

## 2013-05-12 DIAGNOSIS — I498 Other specified cardiac arrhythmias: Secondary | ICD-10-CM | POA: Insufficient documentation

## 2013-05-12 DIAGNOSIS — I2510 Atherosclerotic heart disease of native coronary artery without angina pectoris: Secondary | ICD-10-CM | POA: Insufficient documentation

## 2013-05-12 DIAGNOSIS — E7800 Pure hypercholesterolemia, unspecified: Secondary | ICD-10-CM | POA: Insufficient documentation

## 2013-05-12 DIAGNOSIS — I440 Atrioventricular block, first degree: Secondary | ICD-10-CM | POA: Insufficient documentation

## 2013-05-12 DIAGNOSIS — R001 Bradycardia, unspecified: Secondary | ICD-10-CM

## 2013-05-12 DIAGNOSIS — I252 Old myocardial infarction: Secondary | ICD-10-CM | POA: Insufficient documentation

## 2013-05-12 HISTORY — PX: GASTROSTOMY: SHX5249

## 2013-05-12 HISTORY — DX: Atherosclerosis of renal artery: I70.1

## 2013-05-12 HISTORY — PX: INSERTION OF MESH: SHX5868

## 2013-05-12 HISTORY — DX: Nonrheumatic aortic (valve) stenosis: I35.0

## 2013-05-12 HISTORY — PX: HIATAL HERNIA REPAIR: SHX195

## 2013-05-12 HISTORY — DX: Hyperlipidemia, unspecified: E78.5

## 2013-05-12 LAB — BASIC METABOLIC PANEL
BUN: 21 mg/dL (ref 6–23)
CO2: 25 mEq/L (ref 19–32)
Calcium: 9.8 mg/dL (ref 8.4–10.5)
Chloride: 103 mEq/L (ref 96–112)
Creatinine, Ser: 0.97 mg/dL (ref 0.50–1.10)
Glucose, Bld: 127 mg/dL — ABNORMAL HIGH (ref 70–99)

## 2013-05-12 LAB — URINALYSIS, ROUTINE W REFLEX MICROSCOPIC
Bilirubin Urine: NEGATIVE
Glucose, UA: NEGATIVE mg/dL
Ketones, ur: NEGATIVE mg/dL
Nitrite: NEGATIVE
Protein, ur: 30 mg/dL — AB
Specific Gravity, Urine: 1.021 (ref 1.005–1.030)
pH: 8.5 — ABNORMAL HIGH (ref 5.0–8.0)

## 2013-05-12 LAB — CBC WITH DIFFERENTIAL/PLATELET
Basophils Absolute: 0 10*3/uL (ref 0.0–0.1)
Eosinophils Absolute: 0.1 10*3/uL (ref 0.0–0.7)
Eosinophils Relative: 2 % (ref 0–5)
HCT: 42.7 % (ref 36.0–46.0)
Lymphocytes Relative: 17 % (ref 12–46)
MCH: 30.8 pg (ref 26.0–34.0)
MCV: 88.4 fL (ref 78.0–100.0)
Monocytes Absolute: 0.4 10*3/uL (ref 0.1–1.0)
Monocytes Relative: 5 % (ref 3–12)
Platelets: 198 10*3/uL (ref 150–400)
RDW: 13.4 % (ref 11.5–15.5)

## 2013-05-12 LAB — URINE MICROSCOPIC-ADD ON

## 2013-05-12 LAB — TROPONIN I: Troponin I: <0.3 ng/mL (ref <0.30)

## 2013-05-12 SURGERY — REPAIR, HERNIA, HIATAL, LAPAROSCOPIC
Anesthesia: General | Wound class: Clean Contaminated

## 2013-05-12 MED ORDER — PROMETHAZINE HCL 25 MG/ML IJ SOLN: 6.2500 mg | INTRAMUSCULAR | Status: DC | PRN

## 2013-05-12 MED ORDER — BUPIVACAINE-EPINEPHRINE 0.25% -1:200000 IJ SOLN
INTRAMUSCULAR | Status: DC | PRN
  Administered 2013-05-12: 10 mL

## 2013-05-12 MED ORDER — EPHEDRINE SULFATE 50 MG/ML IJ SOLN
INTRAMUSCULAR | Status: AC
  Filled 2013-05-12: qty 1

## 2013-05-12 MED ORDER — SUCCINYLCHOLINE CHLORIDE 20 MG/ML IJ SOLN
INTRAMUSCULAR | Status: DC | PRN
  Administered 2013-05-12: 100 mg via INTRAVENOUS

## 2013-05-12 MED ORDER — ONDANSETRON HCL 4 MG/2ML IJ SOLN
INTRAMUSCULAR | Status: AC
  Filled 2013-05-12: qty 2

## 2013-05-12 MED ORDER — PROPOFOL 10 MG/ML IV BOLUS
INTRAVENOUS | Status: DC | PRN
  Administered 2013-05-12: 100 mg via INTRAVENOUS

## 2013-05-12 MED ORDER — PANTOPRAZOLE SODIUM 40 MG IV SOLR
40.0000 mg | Freq: Every day | INTRAVENOUS | Status: DC
  Administered 2013-05-12 – 2013-05-13 (×2): 40 mg via INTRAVENOUS
  Filled 2013-05-12 (×4): qty 40

## 2013-05-12 MED ORDER — CEFAZOLIN SODIUM-DEXTROSE 2-3 GM-% IV SOLR
2.0000 g | INTRAVENOUS | Status: AC
  Administered 2013-05-12: 2 g via INTRAVENOUS

## 2013-05-12 MED ORDER — HYDROCODONE-ACETAMINOPHEN 7.5-325 MG/15ML PO SOLN
10.0000 mL | ORAL | Status: DC | PRN
  Administered 2013-05-14: 10 mL via ORAL
  Filled 2013-05-12 (×2): qty 15

## 2013-05-12 MED ORDER — FENTANYL CITRATE 0.05 MG/ML IJ SOLN
INTRAMUSCULAR | Status: AC
  Filled 2013-05-12: qty 5

## 2013-05-12 MED ORDER — HYDROMORPHONE HCL PF 1 MG/ML IJ SOLN: 0.2500 mg | INTRAMUSCULAR | Status: DC | PRN

## 2013-05-12 MED ORDER — ATROPINE SULFATE 0.4 MG/ML IJ SOLN
INTRAMUSCULAR | Status: AC
  Filled 2013-05-12: qty 1

## 2013-05-12 MED ORDER — CISATRACURIUM BESYLATE 20 MG/10ML IV SOLN
INTRAVENOUS | Status: AC
  Filled 2013-05-12: qty 10

## 2013-05-12 MED ORDER — SODIUM CHLORIDE 0.9 % IJ SOLN
INTRAMUSCULAR | Status: AC
  Filled 2013-05-12: qty 10

## 2013-05-12 MED ORDER — CISATRACURIUM BESYLATE (PF) 10 MG/5ML IV SOLN
INTRAVENOUS | Status: DC | PRN
  Administered 2013-05-12: 4 mg via INTRAVENOUS
  Administered 2013-05-12: 2 mg via INTRAVENOUS
  Administered 2013-05-12: 6 mg via INTRAVENOUS
  Administered 2013-05-12: 2 mg via INTRAVENOUS
  Administered 2013-05-12: 4 mg via INTRAVENOUS

## 2013-05-12 MED ORDER — METOCLOPRAMIDE HCL 5 MG/ML IJ SOLN
10.0000 mg | Freq: Four times a day (QID) | INTRAMUSCULAR | Status: DC
  Administered 2013-05-12 – 2013-05-15 (×13): 10 mg via INTRAVENOUS
  Filled 2013-05-12 (×19): qty 2

## 2013-05-12 MED ORDER — SUCCINYLCHOLINE CHLORIDE 20 MG/ML IJ SOLN
INTRAMUSCULAR | Status: AC
  Filled 2013-05-12: qty 1

## 2013-05-12 MED ORDER — LABETALOL HCL 200 MG PO TABS
200.0000 mg | ORAL_TABLET | Freq: Once | ORAL | Status: AC
  Administered 2013-05-12: 200 mg via ORAL
  Filled 2013-05-12: qty 1

## 2013-05-12 MED ORDER — GLYCOPYRROLATE 0.2 MG/ML IJ SOLN
INTRAMUSCULAR | Status: DC | PRN
  Administered 2013-05-12: 0.6 mg via INTRAVENOUS

## 2013-05-12 MED ORDER — 0.9 % SODIUM CHLORIDE (POUR BTL) OPTIME
TOPICAL | Status: DC | PRN
  Administered 2013-05-12: 1000 mL

## 2013-05-12 MED ORDER — NEOSTIGMINE METHYLSULFATE 1 MG/ML IJ SOLN
INTRAMUSCULAR | Status: DC | PRN
  Administered 2013-05-12: 4 mg via INTRAVENOUS

## 2013-05-12 MED ORDER — ATROPINE SULFATE 0.4 MG/ML IJ SOLN
INTRAMUSCULAR | Status: DC | PRN
  Administered 2013-05-12: 0.4 mg via INTRAVENOUS

## 2013-05-12 MED ORDER — PROPOFOL 10 MG/ML IV BOLUS
INTRAVENOUS | Status: AC
  Filled 2013-05-12: qty 20

## 2013-05-12 MED ORDER — LACTATED RINGERS IV SOLN
INTRAVENOUS | Status: DC | PRN
  Administered 2013-05-12 (×3): via INTRAVENOUS

## 2013-05-12 MED ORDER — DEXTROSE-NACL 5-0.9 % IV SOLN
INTRAVENOUS | Status: DC
  Administered 2013-05-12 – 2013-05-13 (×3): 100 mL/h via INTRAVENOUS
  Administered 2013-05-13 – 2013-05-14 (×4): via INTRAVENOUS

## 2013-05-12 MED ORDER — MEPERIDINE HCL 50 MG/ML IJ SOLN: 6.2500 mg | INTRAMUSCULAR | Status: DC | PRN

## 2013-05-12 MED ORDER — ONDANSETRON HCL 4 MG/2ML IJ SOLN
INTRAMUSCULAR | Status: DC | PRN
  Administered 2013-05-12: 4 mg via INTRAVENOUS

## 2013-05-12 MED ORDER — LABETALOL HCL 200 MG PO TABS
200.0000 mg | ORAL_TABLET | Freq: Two times a day (BID) | ORAL | Status: DC
  Filled 2013-05-12: qty 1

## 2013-05-12 MED ORDER — AMLODIPINE BESYLATE 10 MG PO TABS
10.0000 mg | ORAL_TABLET | Freq: Every day | ORAL | Status: DC
  Administered 2013-05-12 – 2013-05-15 (×4): 10 mg via ORAL
  Filled 2013-05-12 (×4): qty 1

## 2013-05-12 MED ORDER — HYDROCHLOROTHIAZIDE 25 MG PO TABS
25.0000 mg | ORAL_TABLET | Freq: Every day | ORAL | Status: DC
  Administered 2013-05-12 – 2013-05-15 (×4): 25 mg via ORAL
  Filled 2013-05-12 (×4): qty 1

## 2013-05-12 MED ORDER — BUPIVACAINE-EPINEPHRINE PF 0.25-1:200000 % IJ SOLN
INTRAMUSCULAR | Status: AC
  Filled 2013-05-12: qty 30

## 2013-05-12 MED ORDER — LACTATED RINGERS IR SOLN
Status: DC | PRN
  Administered 2013-05-12: 1

## 2013-05-12 MED ORDER — HYDRALAZINE HCL 25 MG PO TABS
25.0000 mg | ORAL_TABLET | Freq: Three times a day (TID) | ORAL | Status: DC
  Administered 2013-05-12 – 2013-05-15 (×10): 25 mg via ORAL
  Filled 2013-05-12 (×12): qty 1

## 2013-05-12 MED ORDER — CEFAZOLIN SODIUM-DEXTROSE 2-3 GM-% IV SOLR
INTRAVENOUS | Status: AC
  Filled 2013-05-12: qty 50

## 2013-05-12 MED ORDER — HYDROMORPHONE HCL PF 1 MG/ML IJ SOLN
1.0000 mg | INTRAMUSCULAR | Status: DC | PRN
  Administered 2013-05-12 – 2013-05-13 (×3): 1 mg via INTRAVENOUS
  Filled 2013-05-12 (×3): qty 1

## 2013-05-12 MED ORDER — FENTANYL CITRATE 0.05 MG/ML IJ SOLN
INTRAMUSCULAR | Status: DC | PRN
  Administered 2013-05-12 (×4): 25 ug via INTRAVENOUS
  Administered 2013-05-12 (×3): 50 ug via INTRAVENOUS

## 2013-05-12 MED ORDER — LEVOTHYROXINE SODIUM 100 MCG IV SOLR
75.0000 ug | Freq: Every day | INTRAVENOUS | Status: DC
  Administered 2013-05-12 – 2013-05-13 (×2): 75 ug via INTRAVENOUS
  Filled 2013-05-12 (×4): qty 5

## 2013-05-12 MED ORDER — GLYCOPYRROLATE 0.2 MG/ML IJ SOLN
INTRAMUSCULAR | Status: AC
  Filled 2013-05-12: qty 3

## 2013-05-12 MED ORDER — NEOSTIGMINE METHYLSULFATE 1 MG/ML IJ SOLN
INTRAMUSCULAR | Status: AC
  Filled 2013-05-12: qty 10

## 2013-05-12 MED ORDER — CHLORHEXIDINE GLUCONATE 4 % EX LIQD: 1.0000 "application " | Freq: Once | CUTANEOUS | Status: DC

## 2013-05-12 MED ORDER — EPHEDRINE SULFATE 50 MG/ML IJ SOLN
INTRAMUSCULAR | Status: DC | PRN
  Administered 2013-05-12 (×3): 10 mg via INTRAVENOUS
  Administered 2013-05-12: 5 mg via INTRAVENOUS
  Administered 2013-05-12 (×5): 10 mg via INTRAVENOUS
  Administered 2013-05-12: 5 mg via INTRAVENOUS
  Administered 2013-05-12: 10 mg via INTRAVENOUS

## 2013-05-12 MED ORDER — HYDROCODONE-ACETAMINOPHEN 7.5-325 MG/15ML PO SOLN: 15.0000 mL | Freq: Four times a day (QID) | ORAL | Status: DC | PRN

## 2013-05-12 MED ORDER — SODIUM CHLORIDE 0.9 % IV SOLN
Freq: Once | INTRAVENOUS | Status: AC
  Administered 2013-05-12: 03:00:00 via INTRAVENOUS

## 2013-05-12 MED ORDER — ONDANSETRON HCL 4 MG/2ML IJ SOLN: 4.0000 mg | Freq: Four times a day (QID) | INTRAMUSCULAR | Status: DC | PRN

## 2013-05-12 SURGICAL SUPPLY — 69 items
ADH SKN CLS APL DERMABOND .7 (GAUZE/BANDAGES/DRESSINGS)
APL SKNCLS STERI-STRIP NONHPOA (GAUZE/BANDAGES/DRESSINGS)
APPLIER CLIP ROT 10 11.4 M/L (STAPLE)
APR CLP MED LRG 11.4X10 (STAPLE)
BENZOIN TINCTURE PRP APPL 2/3 (GAUZE/BANDAGES/DRESSINGS) IMPLANT
CANISTER SUCTION 2500CC (MISCELLANEOUS) ×1 IMPLANT
CATH DRAINAGE MALECOT 26FR (CATHETERS) IMPLANT
CATH MALECOT (CATHETERS) ×2
CHLORAPREP W/TINT 26ML (MISCELLANEOUS) ×2 IMPLANT
CLAMP ENDO BABCK 10MM (STAPLE) IMPLANT
CLIP APPLIE ROT 10 11.4 M/L (STAPLE) IMPLANT
DECANTER SPIKE VIAL GLASS SM (MISCELLANEOUS) ×2 IMPLANT
DERMABOND ADVANCED (GAUZE/BANDAGES/DRESSINGS)
DERMABOND ADVANCED .7 DNX12 (GAUZE/BANDAGES/DRESSINGS) IMPLANT
DEVICE SUTURE ENDOST 10MM (ENDOMECHANICALS) IMPLANT
DISSECTOR BLUNT TIP ENDO 5MM (MISCELLANEOUS) IMPLANT
DRAIN PENROSE 18X1/2 LTX STRL (DRAIN) ×2 IMPLANT
DRAPE LAPAROSCOPIC ABDOMINAL (DRAPES) ×2 IMPLANT
DRAPE UTILITY XL STRL (DRAPES) ×2 IMPLANT
ELECT REM PT RETURN 9FT ADLT (ELECTROSURGICAL) ×2
ELECTRODE REM PT RTRN 9FT ADLT (ELECTROSURGICAL) ×1 IMPLANT
GLOVE BIO SURGEON STRL SZ7.5 (GLOVE) ×4 IMPLANT
GLOVE BIOGEL PI IND STRL 7.0 (GLOVE) ×1 IMPLANT
GLOVE BIOGEL PI INDICATOR 7.0 (GLOVE) ×1
GOWN PREVENTION PLUS LG XLONG (DISPOSABLE) ×2 IMPLANT
GOWN PREVENTION PLUS XLARGE (GOWN DISPOSABLE) ×2 IMPLANT
GOWN STRL REIN XL XLG (GOWN DISPOSABLE) ×6 IMPLANT
GRASPER ENDO BABCOCK 10 (MISCELLANEOUS) IMPLANT
GRASPER ENDO BABCOCK 10MM (MISCELLANEOUS)
KIT BASIN OR (CUSTOM PROCEDURE TRAY) ×2 IMPLANT
LEGGING LITHOTOMY PAIR STRL (DRAPES) ×2 IMPLANT
MESH HERNIA 7X10 (Mesh General) ×1 IMPLANT
NDL INSUFFLATION 14GA 120MM (NEEDLE) IMPLANT
NEEDLE INSUFFLATION 14GA 120MM (NEEDLE) IMPLANT
NS IRRIG 1000ML POUR BTL (IV SOLUTION) ×2 IMPLANT
PENCIL BUTTON HOLSTER BLD 10FT (ELECTRODE) IMPLANT
PLUG CATH AND CAP STER (CATHETERS) ×1 IMPLANT
RELOAD STAPLE 4.0 BLU F/HERNIA (INSTRUMENTS) IMPLANT
RELOAD STAPLE 4.8 BLK F/HERNIA (STAPLE) IMPLANT
RELOAD STAPLE HERNIA 4.0 BLUE (INSTRUMENTS) ×2 IMPLANT
RELOAD STAPLE HERNIA 4.8 BLK (STAPLE) IMPLANT
RETRACTOR LAPSCP 12X46 CVD (ENDOMECHANICALS) IMPLANT
RTRCTR LAPSCP 12X46 CVD (ENDOMECHANICALS)
SCALPEL HARMONIC ACE (MISCELLANEOUS) ×2 IMPLANT
SET IRRIG TUBING LAPAROSCOPIC (IRRIGATION / IRRIGATOR) ×2 IMPLANT
SLEEVE XCEL OPT CAN 5 100 (ENDOMECHANICALS) ×3 IMPLANT
SOLUTION ANTI FOG 6CC (MISCELLANEOUS) ×2 IMPLANT
STAPLER HERNIA 12 8.5 360D (INSTRUMENTS) ×1 IMPLANT
STAPLER VISISTAT 35W (STAPLE) IMPLANT
STRIP CLOSURE SKIN 1/2X4 (GAUZE/BANDAGES/DRESSINGS) IMPLANT
SUT ETHIBOND 0 36 GRN (SUTURE) ×6 IMPLANT
SUT MNCRL AB 4-0 PS2 18 (SUTURE) ×2 IMPLANT
SUT SILK 0 SH 30 (SUTURE) ×2 IMPLANT
SUT SURGIDAC NAB ES-9 0 48 120 (SUTURE) ×4 IMPLANT
SUT VICRYL 0 TIES 12 18 (SUTURE) IMPLANT
TIP INNERVISION DETACH 40FR (MISCELLANEOUS) IMPLANT
TIP INNERVISION DETACH 50FR (MISCELLANEOUS) IMPLANT
TIP INNERVISION DETACH 56FR (MISCELLANEOUS) IMPLANT
TIPS INNERVISION DETACH 40FR (MISCELLANEOUS)
TOWEL OR 17X26 10 PK STRL BLUE (TOWEL DISPOSABLE) ×4 IMPLANT
TRAY FOLEY CATH 14FRSI W/METER (CATHETERS) ×2 IMPLANT
TRAY LAP CHOLE (CUSTOM PROCEDURE TRAY) ×2 IMPLANT
TROCAR BLADELESS OPT 5 100 (ENDOMECHANICALS) ×1 IMPLANT
TROCAR BLADELESS OPT 5 75 (ENDOMECHANICALS) IMPLANT
TROCAR XCEL 12X100 BLDLESS (ENDOMECHANICALS) ×1 IMPLANT
TROCAR XCEL BLUNT TIP 100MML (ENDOMECHANICALS) IMPLANT
TROCAR XCEL NON-BLD 11X100MML (ENDOMECHANICALS) ×1 IMPLANT
TROCAR XCEL UNIV SLVE 11M 100M (ENDOMECHANICALS) IMPLANT
TUBING INSUFFLATION 10FT LAP (TUBING) ×2 IMPLANT

## 2013-05-12 NOTE — ED Provider Notes (Signed)
CSN: [id number]     Arrival date & time 05/12/13  0023 History   First MD Initiated Contact with Patient 05/12/13 0131     Chief Complaint  Patient presents with  . Abdominal Pain   (Consider location/radiation/quality/duration/timing/severity/associated sxs/prior Treatment) HPI Comments: 79 year old female with a history of hypertension, myocardial infarction, hyperlipidemia presents with one-day history of abdominal pain. Pain started at 8 pm, and was constant, epigastric, non radiating, sharp - so she came to the ED. She had nausea, emesis x 3 prior to ED arrival. Pain similar to her hernia pain, but little higher than usual. Pt was pain free by the time i saw her. She denies any chest pain, dib, diarrhea.  Patient is a 79 y.o. female presenting with abdominal pain. The history is provided by the patient.  Abdominal Pain Associated symptoms: nausea and vomiting   Associated symptoms: no chest pain, no dysuria and no shortness of breath     Past Medical History  Diagnosis Date  . MI (myocardial infarction)   . Hypertension   . High cholesterol   . Ulcer   . Colon polyps     removed with colonoscopy  . Murmur, cardiac   . CAD (coronary artery disease)   . Gastric outlet obstruction     10'14- hospital stay of several days([facility])  . Hiatal hernia   . Dementia 05-05-13    short term memory issues"no dx"  . Shortness of breath     sob  . Hypothyroidism   . Chronic kidney disease 05-05-13    renal dysfunction- has improved from 3'11  . Anemia     past hx. 2011   Past Surgical History  Procedure Laterality Date  . Coronary artery bypass graft  2002    Dr Dorris Fetch  . Cholecystectomy      open  . Carotid endarterectomy Left 2011    Dr. Darrick Penna   Family History  Problem Relation Age of Onset  . Heart disease Father    History  Substance Use Topics  . Smoking status: Former Smoker    Quit date: 05/05/1989  . Smokeless tobacco: Never Used  . Alcohol Use: No   OB  History   Grav Para Term Preterm Abortions TAB SAB Ect Mult Living                 Review of Systems  Constitutional: Negative for activity change.  Respiratory: Negative for shortness of breath.   Cardiovascular: Negative for chest pain.  Gastrointestinal: Positive for nausea, vomiting and abdominal pain.  Genitourinary: Negative for dysuria.  Musculoskeletal: Negative for neck pain.  Neurological: Negative for headaches.    Allergies  Codeine phosphate  Home Medications   Current Outpatient Rx  Name  Route  Sig  Dispense  Refill  . amLODipine (NORVASC) 10 MG tablet   Oral   Take 10 mg by mouth daily with breakfast.          . hydrochlorothiazide (HYDRODIURIL) 25 MG tablet   Oral   Take 25 mg by mouth daily with breakfast.          . labetalol (NORMODYNE) 200 MG tablet   Oral   Take 200 mg by mouth 2 (two) times daily.         Marland Kitchen levothyroxine (SYNTHROID, LEVOTHROID) 150 MCG tablet   Oral   Take 150 mcg by mouth daily before breakfast.         . pravastatin (PRAVACHOL) 80 MG tablet   Oral  Take 80 mg by mouth daily.         Marland Kitchen aspirin EC 81 MG tablet   Oral   Take 81 mg by mouth daily.         . Cholecalciferol (VITAMIN D-3) 5000 UNITS TABS   Oral   Take 5,000 Units by mouth daily.         . ferrous sulfate 325 (65 FE) MG tablet   Oral   Take 325 mg by mouth 2 (two) times daily.         . naproxen sodium (ANAPROX) 220 MG tablet   Oral   Take 220 mg by mouth as needed (pain).          . Probiotic Product (PROBIOTIC DAILY PO)   Oral   Take 1 tablet by mouth daily.          BP 157/65  Pulse 79  Temp(Src) 97.5 F (36.4 C) (Oral)  Resp 18  SpO2 94% Physical Exam  Nursing note and vitals reviewed. Constitutional: She is oriented to person, place, and time. She appears well-developed and well-nourished.  HENT:  Head: Normocephalic and atraumatic.  Eyes: EOM are normal. Pupils are equal, round, and reactive to light.  Neck: Neck  supple.  Cardiovascular: Normal rate, regular rhythm and normal heart sounds.   No murmur heard. Pulmonary/Chest: Effort normal. No respiratory distress.  Abdominal: Soft. She exhibits no distension. There is no tenderness. There is no rebound and no guarding.  Neurological: She is alert and oriented to person, place, and time.  Skin: Skin is warm and dry.    ED Course  Procedures (including critical care time) Labs Review Labs Reviewed  BASIC METABOLIC PANEL  CBC WITH DIFFERENTIAL  URINALYSIS, ROUTINE W REFLEX MICROSCOPIC  TROPONIN I   Imaging Review No results found.  EKG Interpretation   None       MDM  No diagnosis found.  Pt comes in with cc of epigastric abd pain. She is s/o cholecystectomy. She has hx of CAD, hiatal hernia with SBO. Started having pain post supper, which seemingly resolved on it's own. No tenderness on my exam. Pt states that the pain is similar to her hernia pain, just different location. Xray is benign, labs are benign. Pt has her pre-op visit scheduled for 5:30, we will observe her in our ED until then - and transfer to PACU.  Derwood Kaplan, MD 05/12/13 [phone]

## 2013-05-12 NOTE — Anesthesia Postprocedure Evaluation (Signed)
Anesthesia Post Note  Patient: Wanda Robinson  Procedure(s) Performed: Procedure(s) (LRB): LAPAROSCOPIC REPAIR OF HIATAL HERNIA, POSSIBLE NISSEN (N/A) G TUBE PLACEMENT (N/A) INSERTION OF MESH (N/A)  Anesthesia type: General  Patient location: PACU  Post pain: Pain level controlled  Post assessment: Post-op Vital signs reviewed  Last Vitals: BP 157/48  Pulse 54  Temp(Src) 36.2 C (Oral)  Resp 16  Ht 5\' 3"  (1.6 m)  Wt 139 lb (63.05 kg)  BMI 24.63 kg/m2  SpO2 95%  Post vital signs: Reviewed  Level of consciousness: sedated  Complications: No apparent anesthesia complications

## 2013-05-12 NOTE — Interval H&P Note (Signed)
History and Physical Interval Note:  05/12/2013 7:14 AM  Wanda Robinson  has presented today for surgery, with the diagnosis of HIATIAL HERNIA  The various methods of treatment have been discussed with the patient and family. After consideration of risks, benefits and other options for treatment, the patient has consented to  Procedure(s): LAPAROSCOPIC REPAIR OF HIATAL HERNIA, POSSIBLE NISSEN (N/A) G TUBE PLACEMENT (N/A) INSERTION OF MESH (N/A) as a surgical intervention .  The patient's history has been reviewed, patient examined, no change in status, stable for surgery.  I have reviewed the patient's chart and labs.  Questions were answered to the patient's satisfaction.     Marigene Ehlers., Jed Limerick

## 2013-05-12 NOTE — Consult Note (Signed)
HPI: 79 year old female for evaluation of bradycardia. Patient had a myocardial infarction previously. She underwent coronary artery bypassing graft in 2002. She had a LIMA to the LAD, saphenous vein graft to the ramus intermediate, saphenous vein graft to the OM1 and saphenous vein graft to the PDA. Echocardiogram in October of 2014 showed normal LV function, grade 1 diastolic dysfunction, mild aortic stenosis/mild aortic insufficiency. Nuclear study in October of 2014 showed an ejection fraction of 66%. There was a reversible anterior/apical defect consistent with shifting breast attenuation versus prior infarct with peri-infarct ischemia. Patient has some dyspnea on exertion at home but no orthopnea, PND, pedal edema, syncope or exertional chest pain. Patient had laparoscopic repair of hiatal hernia and G-tube placement today. Postoperatively in the PACU she was noted to have transient junctional rhythm. On telemetry upon arrival to the floor she had sinus rhythm with first degree AV block and intermittent Mobitz 1 and 2-1 AV block. Cardiology is asked to evaluate. At present she is not having chest pain, dyspnea or dizziness.  Medications Prior to Admission  Medication Sig Dispense Refill  . amLODipine (NORVASC) 10 MG tablet Take 10 mg by mouth daily with breakfast.       . hydrochlorothiazide (HYDRODIURIL) 25 MG tablet Take 25 mg by mouth daily with breakfast.       . labetalol (NORMODYNE) 200 MG tablet Take 200 mg by mouth 2 (two) times daily.      Marland Kitchen levothyroxine (SYNTHROID, LEVOTHROID) 150 MCG tablet Take 150 mcg by mouth daily before breakfast.      . pravastatin (PRAVACHOL) 80 MG tablet Take 80 mg by mouth daily.      Marland Kitchen aspirin EC 81 MG tablet Take 81 mg by mouth daily.      . Cholecalciferol (VITAMIN D-3) 5000 UNITS TABS Take 5,000 Units by mouth daily.      . ferrous sulfate 325 (65 FE) MG tablet Take 325 mg by mouth 2 (two) times daily.      . naproxen sodium (ANAPROX) 220 MG tablet  Take 220 mg by mouth as needed (pain).       . Probiotic Product (PROBIOTIC DAILY PO) Take 1 tablet by mouth daily.        Allergies  Allergen Reactions  . Codeine Phosphate Nausea Only    Past Medical History  Diagnosis Date  . MI (myocardial infarction)   . Hypertension   . Hyperlipidemia   . Ulcer   . Colon polyps     removed with colonoscopy  . Aortic stenosis   . CAD (coronary artery disease)   . Gastric outlet obstruction     10'14- hospital stay of several days([facility])  . Hiatal hernia   . Dementia 05-05-13    short term memory issues"no dx"  . Hypothyroidism   . Chronic kidney disease 05-05-13    renal dysfunction- has improved from 3'11  . Anemia     past hx. 2011    Past Surgical History  Procedure Laterality Date  . Coronary artery bypass graft  2002    Dr Dorris Fetch  . Cholecystectomy      open  . Carotid endarterectomy Left 2011    Dr. Darrick Penna    History   Social History  . Marital Status: Widowed    Spouse Name: N/A    Number of Children: 4  . Years of Education: N/A   Occupational History  . Not on file.   Social History Main Topics  . Smoking  status: Former Smoker    Quit date: 05/05/1989  . Smokeless tobacco: Never Used  . Alcohol Use: No  . Drug Use: No  . Sexual Activity: No   Other Topics Concern  . Not on file   Social History Narrative  . No narrative on file    Family History  Problem Relation Age of Onset  . Heart disease Father     CAD    ROS:  no fevers or chills, productive cough, hemoptysis, dysphasia, odynophagia, melena, hematochezia, dysuria, hematuria, rash, seizure activity, orthopnea, PND, pedal edema, claudication. Remaining systems are negative.  Physical Exam:   Blood pressure 157/48, pulse 54, temperature 97.1 F (36.2 C), temperature source Oral, resp. rate 16, height 5\' 3"  (1.6 m), weight 139 lb (63.05 kg), SpO2 95.00%.  General:  Well developed/well nourished in NAD Skin warm/dry Patient not  depressed No peripheral clubbing Back-normal HEENT-normal/normal eyelids Neck supple/normal carotid upstroke bilaterally; no bruits; no JVD; no thyromegaly chest - CTA anteriorly CV - RRR/normal S1 and S2; no rubs or gallops;  PMI not palpated as brace in place following surgery. 3/6 systolic murmur. Abdomen -s/p abdominal surgery and G tube 2+ femoral pulses, right femoral bruit Ext-no edema, chords, 2+ DP Neuro-grossly nonfocal  ECG sinus rhythm, first degree AV block, right axis deviation, lateral T wave inversion.  Results for orders placed during the hospital encounter of 05/12/13 (from the past 48 hour(s))  BASIC METABOLIC PANEL     Status: Abnormal   Collection Time    05/12/13  2:20 AM      Result Value Range   Sodium 139  135 - 145 mEq/L   Potassium 3.8  3.5 - 5.1 mEq/L   Chloride 103  96 - 112 mEq/L   CO2 25  19 - 32 mEq/L   Glucose, Bld 127 (*) 70 - 99 mg/dL   BUN 21  6 - 23 mg/dL   Creatinine, Ser 1.61  0.50 - 1.10 mg/dL   Calcium 9.8  8.4 - 09.6 mg/dL   GFR calc non Af Amer 53 (*) >90 mL/min   GFR calc Af Amer 61 (*) >90 mL/min   Comment: (NOTE)     The eGFR has been calculated using the CKD EPI equation.     This calculation has not been validated in all clinical situations.     eGFR's persistently <90 mL/min signify possible Chronic Kidney     Disease.  CBC WITH DIFFERENTIAL     Status: None   Collection Time    05/12/13  2:20 AM      Result Value Range   WBC 6.7  4.0 - 10.5 K/uL   RBC 4.83  3.87 - 5.11 MIL/uL   Hemoglobin 14.9  12.0 - 15.0 g/dL   HCT 04.5  40.9 - 81.1 %   MCV 88.4  78.0 - 100.0 fL   MCH 30.8  26.0 - 34.0 pg   MCHC 34.9  30.0 - 36.0 g/dL   RDW 91.4  78.2 - 95.6 %   Platelets 198  150 - 400 K/uL   Neutrophils Relative % 76  43 - 77 %   Neutro Abs 5.1  1.7 - 7.7 K/uL   Lymphocytes Relative 17  12 - 46 %   Lymphs Abs 1.1  0.7 - 4.0 K/uL   Monocytes Relative 5  3 - 12 %   Monocytes Absolute 0.4  0.1 - 1.0 K/uL   Eosinophils Relative 2   0 - 5 %  Eosinophils Absolute 0.1  0.0 - 0.7 K/uL   Basophils Relative 0  0 - 1 %   Basophils Absolute 0.0  0.0 - 0.1 K/uL  TROPONIN I     Status: None   Collection Time    05/12/13  2:20 AM      Result Value Range   Troponin I <0.30  <0.30 ng/mL   Comment:            Due to the release kinetics of cTnI,     a negative result within the first hours     of the onset of symptoms does not rule out     myocardial infarction with certainty.     If myocardial infarction is still suspected,     repeat the test at appropriate intervals.  URINALYSIS, ROUTINE W REFLEX MICROSCOPIC     Status: Abnormal   Collection Time    05/12/13  4:47 AM      Result Value Range   Color, Urine YELLOW  YELLOW   APPearance TURBID (*) CLEAR   Specific Gravity, Urine 1.021  1.005 - 1.030   pH 8.5 (*) 5.0 - 8.0   Glucose, UA NEGATIVE  NEGATIVE mg/dL   Hgb urine dipstick NEGATIVE  NEGATIVE   Bilirubin Urine NEGATIVE  NEGATIVE   Ketones, ur NEGATIVE  NEGATIVE mg/dL   Protein, ur 30 (*) NEGATIVE mg/dL   Urobilinogen, UA 1.0  0.0 - 1.0 mg/dL   Nitrite NEGATIVE  NEGATIVE   Leukocytes, UA SMALL (*) NEGATIVE  URINE MICROSCOPIC-ADD ON     Status: Abnormal   Collection Time    05/12/13  4:47 AM      Result Value Range   Bacteria, UA MANY (*) RARE   Urine-Other URINALYSIS PERFORMED ON SUPERNATANT     Comment: CONTAMINATED WITH FECAL MATTER.    Dg Chest 2 View  05/12/2013   CLINICAL DATA:  Sudden onset of chest pain.  EXAM: CHEST  2 VIEW  COMPARISON:  03/28/2013  FINDINGS: Stable appearance of postoperative changes in the mediastinum. Large esophageal hiatal hernia as previously identified. The enteric tube is been removed in the interval. Heart size and pulmonary vascularity are normal. Probable emphysematous changes in the lungs. No focal airspace disease or consolidation. No blunting of costophrenic angles. No pneumothorax.  IMPRESSION: No active cardiopulmonary disease.   Electronically Signed   By: Burman Nieves M.D.   On: 05/12/2013 02:39    Assessment/Plan 1 bradycardia-patient is noted to have transient junctional rhythm, Mobitz 1 and 2:1 AV block on telemetry postoperatively. She is asymptomatic. No history of syncope. She is on labetalol which could be contributing. I will discontinue that medication. Follow on telemetry. No indication for pacemaker at present. 2 hypertension-continue amlodipine and HCTZ. Discontinue labetalol given bradycardia. I am hesitant tablet an ACE inhibitor or ARB given history of renal insufficiency and previous interventions for renal artery stenosis bilaterally. Add hydralazine 25 mg by mouth 3 times a day and increase as needed. 3 coronary artery disease-continue aspirin and statin. 4 hyperlipidemia-continue statin. 5 status post laparoscopic repair of hiatal hernia and G-tube placement-management per surgery. 6 mild aortic stenosis.  Olga Millers MD 05/12/2013, 4:05 PM

## 2013-05-12 NOTE — Transfer of Care (Signed)
Immediate Anesthesia Transfer of Care Note  Patient: Wanda Robinson  Procedure(s) Performed: Procedure(s): LAPAROSCOPIC REPAIR OF HIATAL HERNIA, POSSIBLE NISSEN (N/A) G TUBE PLACEMENT (N/A) INSERTION OF MESH (N/A)  Patient Location: PACU  Anesthesia Type:General  Level of Consciousness: awake, sedated and patient cooperative  Airway & Oxygen Therapy: Patient Spontanous Breathing and Patient connected to face mask oxygen  Post-op Assessment: Report given to PACU RN and Post -op Vital signs reviewed and stable  Post vital signs: Reviewed and stable  Complications: No apparent anesthesia complications

## 2013-05-12 NOTE — H&P (View-Only) (Signed)
Subjective:     Patient ID: Wanda Robinson, female   DOB: 01/29/1929, 79 y.o.   MRN: [id number]  HPI The patient is an 79-year-old female who is recently hospitalized secondary to gastric obstruction. Patient has a known hiatal hernia with herniated stomach into the right chest. This was hospitalized secondary to pain and obstruction. This resolved after NG tube placement conservative treatment.   Patient is known aboutHer hiatal hernia for 2-3 years. This was her first episode that required hospitalization. Subsequently discharged patient had no issues.  The patient comes in today with her daughter who is her POA.   Past Medical History  Diagnosis Date  . MI (myocardial infarction)   . Hypertension   . High cholesterol   . Ulcer   . Anemia   . Colon polyps   . Murmur, cardiac   . CAD (coronary artery disease)   . Gastric outlet obstruction   . Hiatal hernia    Past Surgical History  Procedure Laterality Date  . Coronary artery bypass graft  2002    Dr Hendrickson  . Cholecystectomy      open  . Carotid endarterectomy Left 2011    Dr. Fields   Allergies  Allergen Reactions  . Codeine   . Codeine Phosphate Nausea Only   Current Outpatient Prescriptions on File Prior to Visit  Medication Sig Dispense Refill  . amLODipine (NORVASC) 10 MG tablet Take 10 mg by mouth daily.      . aspirin EC 81 MG tablet Take 81 mg by mouth daily.      . Cholecalciferol (VITAMIN D-3) 5000 UNITS TABS Take 5,000 Units by mouth daily.      . ferrous sulfate 325 (65 FE) MG tablet Take 325 mg by mouth 2 (two) times daily.      . hydrochlorothiazide (HYDRODIURIL) 25 MG tablet Take 25 mg by mouth daily.      . labetalol (NORMODYNE) 200 MG tablet Take 200 mg by mouth 2 (two) times daily.      . levothyroxine (SYNTHROID, LEVOTHROID) 125 MCG tablet Take 125 mcg by mouth daily before breakfast.      . naproxen sodium (ANAPROX) 220 MG tablet Take 220 mg by mouth as needed.       . pravastatin (PRAVACHOL)  80 MG tablet Take 80 mg by mouth daily.      . Probiotic Product (PROBIOTIC DAILY PO) Take 1 tablet by mouth daily.       No current facility-administered medications on file prior to visit.     Review of Systems  Constitutional: Negative.   HENT: Negative.   Respiratory: Negative.   Cardiovascular: Negative.   Gastrointestinal: Negative.   Neurological: Negative.   All other systems reviewed and are negative.       Objective:   Physical Exam  Vitals reviewed. Constitutional: She is oriented to person, place, and time. She appears well-developed and well-nourished.  HENT:  Head: Normocephalic and atraumatic.  Eyes: Conjunctivae and EOM are normal. Pupils are equal, round, and reactive to light.  Neck: Normal range of motion. Neck supple.  Cardiovascular: Normal rate and normal heart sounds.   Pulmonary/Chest: Effort normal and breath sounds normal.  Abdominal: Soft. Bowel sounds are normal.  Musculoskeletal: Normal range of motion.  Neurological: She is alert and oriented to person, place, and time.  Skin: Skin is warm and dry.       Assessment:     A 79-year-old female with a Hiatal hernia      Plan:     1I discussed the surgery with the patient and her daughter who is her POA about surgical repair. They have decided to proceed with laparoscopic hernia repair and G-tube placement. 2. Discussed with them the risks and benefits of the surgery to include infection, bleeding, damage to surrounding structures to include esophageal leak, the need for further surgery and her procedures, possible fluid accumulation within the chest, possible need for chest tube placement, and any other subsequent need procedures. The patient and her daughter voiced understanding and wished to proceed with the operation. 3. The patient sees Dr. Jordan as her cardiologist. We will need a letter of cardiac evaluation prior schedule her surgery. 4. Discussed with her daughter the need for her to  supplement her diet with Ensure. Her albumin is 4.3 today would like to be stable prior surgery.    

## 2013-05-12 NOTE — Progress Notes (Signed)
Nutrition Education Note  Received consult for full liquid diet education for hiatal hernia repair, for pt to follow for the next 4-6 weeks per MD. Met with pt and daughter and discussed full liquid diet plan from all of the food groups. Nutritional supplements discussed. Daughter reports pt already drinking Ensure at home and was eating well PTA. Teach back method used. Full liquid diet handouts with meal plans and RD contact information provided. Expect good compliance.   Levon Hedger MS, RD, LDN [phone] Pager [phone] After Hours Pager

## 2013-05-12 NOTE — Care Management Note (Addendum)
    Page 1 of 1   05/15/2013     12:08:38 PM   CARE MANAGEMENT NOTE 05/15/2013  Patient:  AMIYRAH, LAMERE   Account Number:  [id number]  Date Initiated:  05/12/2013  Documentation initiated by:  Lanier Clam  Subjective/Objective Assessment:   79 Y/O F ADMITTED W/ABDOMINAL HERNIA.     Action/Plan:   FROM HOME W/SPOUSE.HAS PCP,PHARMACY.   Anticipated DC Date:  05/15/2013   Anticipated DC Plan:  HOME W HOME HEALTH SERVICES      DC Planning Services  CM consult      Choice offered to / List presented to:  C-4 Adult Children           Status of service:  In process, will continue to follow Medicare Important Message given?   (If response is "NO", the following Medicare IM given date fields will be blank) Date Medicare IM given:   Date Additional Medicare IM given:    Discharge Disposition:    Per UR Regulation:  Reviewed for med. necessity/level of care/duration of stay  If discussed at Long Length of Stay Meetings, dates discussed:    Comments:  05/15/13 Dim Meisinger RN,BSN NCM [phone] POD#3 LAP REPAIR HIATLA HERNIA,CARDIO-FOLLOWING.PT-HH.[facility] CHOSEN,TC REP AWARE OF REFERRAL,& FOLLOWING.RECOMMEND HHPT ORDER.AWAIT FINAL HHPT ORDER.  05/12/13 Shawnika Pepin RN,BSN NCM [phone] S/P LAP REPAIR OF HIATAL HERNIA,GT PLACEMENT.D/C PLAN HOME.

## 2013-05-12 NOTE — Preoperative (Signed)
Beta Blockers   Reason not to administer Beta Blockers:Not Applicable 

## 2013-05-12 NOTE — Progress Notes (Signed)
Dr. Derrell Lolling notified of cardiac progression from 1 degree AV block to 2nd degree type II block.

## 2013-05-12 NOTE — ED Notes (Signed)
Pt presents with abdominal pain that started at 8 pm last night. Pt is scheduled to have surgery on her hiatal hernia at 7:30 this morning and was drinking the magnesium citrate for the surgery. After drinking one bottle, she began to throw up and has had nausea and vomiting since then.

## 2013-05-12 NOTE — Op Note (Signed)
05/12/2013  10:43 AM  PATIENT:  Wanda Robinson  79 y.o. female  PRE-OPERATIVE DIAGNOSIS:  HIATIAL HERNIA  POST-OPERATIVE DIAGNOSIS:  HIATIAL HERNIA  PROCEDURE:  Procedure(s): LAPAROSCOPIC REPAIR OF HIATAL HERNIA, and G TUBE PLACEMENT (N/A) INSERTION OF MESH (N/A)  SURGEON:  Surgeon(s) and Role:    * Axel Filler, MD - Primary     PHYSICIAN ASSISTANT: none  ASSISTANTS: Luretha Murphy, MD   ANESTHESIA:   general  EBL:  Total I/O In: 2000 [I.V.:2000] Out: 200 [Urine:200]  BLOOD ADMINISTERED:none  DRAINS: Gastrostomy Tube   LOCAL MEDICATIONS USED:  MARCAINE     SPECIMEN:  No Specimen  DISPOSITION OF SPECIMEN:  N/A  COUNTS:  YES  TOURNIQUET:  * No tourniquets in log *  OR Dictation: After consent patient taken back to the operating room. Patient was placed in lithotomy position. Patient is an prepped and draped in the usual sterile fashion.  Veress needle technique was used and a 14 mm of mercuryjust to the right of the umbilicus. A 5 mm  camera and trocar were then introduced and there was no injury to any intra-abdominal organs.   The there was some small amount of adhesions to the anterior abdominal wall in the midline from the previous incision, and These were taken down after a 5 mm trocar was placed at Palmer's point.  A subsequent 5 mm trocar was in place in the epigastrium under direct visualization as one at the umbilicus. He will trocar was in place just to the left of the midline and the epigastrium.  A self retaining liver retractor was then placed to elevate the liver.  The stomach was then retracted down from the chest into the abdomen. The lesser omentum was then incised. The right crus was identified and cleaned down to the base. The stomach was retracted caudad and the left crus was identified. It was able to dissect away the peritoneum from the left crus. At this time a quarter inch Penrose drain was then placed around the esophagus and the proximal portion of  the stomach. This area and retraction. At this time the hernia sac was then removed from the surrounding esophageal hiatus. This was circumferentially dissected away and the stomach and laid in the abdomen without any undue tension and without retracting the chest  At this time though Ethibond stitches were placed to reapproximate the crus in interrupted fashion. The hiatus was not tight. The stomach lay within the abdomen. At this time a piece of Surgisis mesh was in place at the hiatus and stable to the repair using 4.0 mm staples from a Covidien hernia stapler.  At this time a 2-0 silk was used to create a pursestring around the body of the stomach. A Foley balloon was then inserted into the trocar site at Palmer's point and into the stomach after a gastrotomy was made using Bovie cautery. The balloon was inflated, and pulled tight anterior abdominal wall.  All trochars were removed. The skin was then reapproximated using 4-0 Monocryl to subcuticular fashion. The skin was then dressed with Dermabond. The Foley was anchored to the anterior abdominal wall using 2-0 nylon.  The patient was taken to the recovery room in stable condition.  PLAN OF CARE: Admit to observation  PATIENT DISPOSITION:  PACU - hemodynamically stable.   Delay start of Pharmacological VTE agent (>24hrs) due to surgical blood loss or risk of bleeding: yes

## 2013-05-13 ENCOUNTER — Encounter (HOSPITAL_COMMUNITY): Payer: Self-pay | Admitting: General Surgery

## 2013-05-13 ENCOUNTER — Observation Stay (HOSPITAL_COMMUNITY): Payer: Medicare Other

## 2013-05-13 LAB — CBC
HCT: 41.4 % (ref 36.0–46.0)
MCHC: 34.3 g/dL (ref 30.0–36.0)
Platelets: 164 10*3/uL (ref 150–400)
RBC: 4.65 MIL/uL (ref 3.87–5.11)
RDW: 13.8 % (ref 11.5–15.5)
WBC: 8.9 10*3/uL (ref 4.0–10.5)

## 2013-05-13 LAB — COMPREHENSIVE METABOLIC PANEL
ALT: 21 U/L (ref 0–35)
AST: 32 U/L (ref 0–37)
Albumin: 2.9 g/dL — ABNORMAL LOW (ref 3.5–5.2)
CO2: 25 mEq/L (ref 19–32)
Calcium: 8.6 mg/dL (ref 8.4–10.5)
Potassium: 3.7 mEq/L (ref 3.5–5.1)
Sodium: 137 mEq/L (ref 135–145)
Total Protein: 5.6 g/dL — ABNORMAL LOW (ref 6.0–8.3)

## 2013-05-13 LAB — MAGNESIUM: Magnesium: 2.5 mg/dL (ref 1.5–2.5)

## 2013-05-13 LAB — PHOSPHORUS: Phosphorus: 2.5 mg/dL (ref 2.3–4.6)

## 2013-05-13 MED ORDER — IOHEXOL 300 MG/ML  SOLN
150.0000 mL | Freq: Once | INTRAMUSCULAR | Status: AC | PRN
  Administered 2013-05-13: 09:00:00 40 mL via ORAL

## 2013-05-13 MED ORDER — ENSURE COMPLETE PO LIQD
237.0000 mL | Freq: Two times a day (BID) | ORAL | Status: DC
  Administered 2013-05-13 – 2013-05-15 (×6): 237 mL via ORAL

## 2013-05-13 NOTE — Discharge Summary (Signed)
Physician Discharge Summary  Patient ID: Wanda Robinson MRN: [id number] DOB/AGE: 1929/05/04 79 y.o.  Admit date: 05/12/2013 Discharge date: 05/13/2013  Admission Diagnoses: hiatal hernia  Discharge Diagnoses: s/p hiatal hernia repair and g-tube placement Active Problems:   Bradycardia   Discharged Condition: good  Hospital Course: the patient underwent surgery on 05/12/2013. Please see operative note for full details. Postoperative patient was sent to the telemetry floor.  The patient was having issues with bradycardia and cardiology was counseled to she had been seeing Dr. [place] on outpatient basis. Several blood pressure medications were altered as per their note the patient had no further issues. On postop day #1 patient underwent esophagogram which showed no leaks and only gastric distention.  Patient was otherwise feeling well. Patient was otherwise tolerating a full liquid diet. The dietitian that with her and her family to discuss liquid diet while at home.  Patient had episode of infusion overnight.secondary to oversedation the patient was kept and monitored. To continue with her by mouth diet. She became more oriented and was able to sleep point night. She was motivated to go home was eventually discharged home.  Consults: cardiology  Significant Diagnostic Studies: esophagogram: No esophageal leak, gastric distention  Treatments: surgery: 05/12/2013  Discharge Exam: Blood pressure 142/56, pulse 70, temperature 98.7 F (37.1 C), temperature source Oral, resp. rate 18, height 5\' 3"  (1.6 m), weight 139 lb (63.05 kg), SpO2 96.00%. General appearance: alert and cooperative Cardio: regular rate and rhythm, S1, S2 normal, no murmur, click, rub or gallop GI: soft, non-tender; bowel sounds normal; no masses,  no organomegaly  Disposition: 01-Home or Self Care   Future Appointments Provider Department Dept Phone   05/28/2013 11:20 AM Axel Filler, MD [facility],  [place] [phone]       Medication List         amLODipine 10 MG tablet  Commonly known as:  NORVASC  Take 10 mg by mouth daily with breakfast.     aspirin EC 81 MG tablet  Take 81 mg by mouth daily.     ferrous sulfate 325 (65 FE) MG tablet  Take 325 mg by mouth 2 (two) times daily.     hydrochlorothiazide 25 MG tablet  Commonly known as:  HYDRODIURIL  Take 25 mg by mouth daily with breakfast.     HYDROcodone-acetaminophen 7.5-325 mg/15 ml solution  Commonly known as:  HYCET  Take 15 mLs by mouth 4 (four) times daily as needed for moderate pain.     labetalol 200 MG tablet  Commonly known as:  NORMODYNE  Take 200 mg by mouth 2 (two) times daily.     levothyroxine 150 MCG tablet  Commonly known as:  SYNTHROID, LEVOTHROID  Take 150 mcg by mouth daily before breakfast.     naproxen sodium 220 MG tablet  Commonly known as:  ANAPROX  Take 220 mg by mouth as needed (pain).     pravastatin 80 MG tablet  Commonly known as:  PRAVACHOL  Take 80 mg by mouth daily.     PROBIOTIC DAILY PO  Take 1 tablet by mouth daily.     Vitamin D-3 5000 UNITS Tabs  Take 5,000 Units by mouth daily.           Follow-up Information   Follow up with Lajean Saver, MD. Schedule an appointment as soon as possible for a visit in 2 weeks.   Specialty:  General Surgery   Contact information:   [address]  [place] [address] [phone]       Signed: Marigene Ehlers., [facility] 05/13/2013, 9:38 AM

## 2013-05-13 NOTE — Progress Notes (Signed)
1 Day Post-Op  Subjective: Pt doing well this AM.  No cardiac issues overnight.  Pt's energy back to normal.  She states she's ready to go home.  Objective: Vital signs in last 24 hours: Temp:  [97.1 F (36.2 C)-98.7 F (37.1 C)] 98.7 F (37.1 C) (11/26 0622) Pulse Rate:  [51-71] 70 (11/26 0622) Resp:  [11-18] 18 (11/26 0622) BP: (128-163)/(46-62) 142/56 mmHg (11/26 0622) SpO2:  [95 %-100 %] 96 % (11/26 0622) Last BM Date: 05/11/13  Intake/Output from previous day: 11/25 0701 - 11/26 0700 In: 4658.3 [I.V.:4658.3] Out: 1460 [Urine:1460] Intake/Output this shift:    General appearance: alert and cooperative Cardio: regular rate and rhythm, S1, S2 normal, no murmur, click, rub or gallop GI: soft,approp nttp, wound c/d/i, active BS  Lab Results:   Recent Labs  05/12/13 0220 05/13/13 0510  WBC 6.7 8.9  HGB 14.9 14.2  HCT 42.7 41.4  PLT 198 164   BMET  Recent Labs  05/12/13 0220 05/13/13 0510  NA 139 137  K 3.8 3.7  CL 103 104  CO2 25 25  GLUCOSE 127* 139*  BUN 21 14  CREATININE 0.97 0.86  CALCIUM 9.8 8.6   PT/INR No results found for this basename: LABPROT, INR,  in the last 72 hours ABG No results found for this basename: PHART, PCO2, PO2, HCO3,  in the last 72 hours  Studies/Results: Dg Chest 2 View  05/12/2013   CLINICAL DATA:  Sudden onset of chest pain.  EXAM: CHEST  2 VIEW  COMPARISON:  03/28/2013  FINDINGS: Stable appearance of postoperative changes in the mediastinum. Large esophageal hiatal hernia as previously identified. The enteric tube is been removed in the interval. Heart size and pulmonary vascularity are normal. Probable emphysematous changes in the lungs. No focal airspace disease or consolidation. No blunting of costophrenic angles. No pneumothorax.  IMPRESSION: No active cardiopulmonary disease.   Electronically Signed   By: Burman Nieves M.D.   On: 05/12/2013 02:39    Anti-infectives: Anti-infectives   Start     Dose/Rate Route  Frequency Ordered Stop   05/12/13 0531  ceFAZolin (ANCEF) IVPB 2 g/50 mL premix     2 g 100 mL/hr over 30 Minutes Intravenous On call to O.R. 05/12/13 0531 05/12/13 0750      Assessment/Plan: s/p Procedure(s): LAPAROSCOPIC REPAIR OF HIATAL HERNIA, POSSIBLE NISSEN (N/A) G TUBE PLACEMENT (N/A) INSERTION OF MESH (N/A) Esophagogram completed and no leak seen- Will adv to full liq diet. Con't Tele today.  Appreciate Cardiology input. If everything con't well, DC in AM on full liq diet.   LOS: 1 day    Marigene Ehlers., [facility] 05/13/2013

## 2013-05-13 NOTE — Progress Notes (Signed)
UR completed 

## 2013-05-13 NOTE — Progress Notes (Signed)
    Subjective:  Feels pretty good. No chest pain or palpitations.   Objective:  Vital Signs in the last 24 hours: Temp:  [97.1 F (36.2 C)-98.7 F (37.1 C)] 98.7 F (37.1 C) (11/26 0622) Pulse Rate:  [51-71] 70 (11/26 0622) Resp:  [11-18] 18 (11/26 0622) BP: (128-163)/(46-62) 142/56 mmHg (11/26 0622) SpO2:  [95 %-100 %] 96 % (11/26 0622)  Intake/Output from previous day: 11/25 0701 - 11/26 0700 In: 4658.3 [I.V.:4658.3] Out: 1460 [Urine:1460]  Physical Exam: Pt is alert and oriented, NAD HEENT: normal Neck: JVP - normal Lungs: CTA bilaterally CV: RRR with grade 2/6 harsh systolic murmur at the LSB Abd: soft, NT Ext: no C/C/E, distal pulses intact and equal Skin: warm/dry no rash   Lab Results:  Recent Labs  05/12/13 0220 05/13/13 0510  WBC 6.7 8.9  HGB 14.9 14.2  PLT 198 164    Recent Labs  05/12/13 0220 05/13/13 0510  NA 139 137  K 3.8 3.7  CL 103 104  CO2 25 25  GLUCOSE 127* 139*  BUN 21 14  CREATININE 0.97 0.86    Recent Labs  05/12/13 0220  TROPONINI <0.30   Tele: Sinus rhythm, PVC's, some bradycardia with heart rate in the 50s.   Assessment/Plan:  1. Bradycardia - post-op. Pt's labetalol stopped and hydralazine added. Would keep on tele today and will recheck in the am. Seems stable from cardiac perspective.   2. HTN - hydralazine added. BP is in range currently.  3. CAD - add Aspirin when safe from post-op perspective.    Tonny Bollman, M.D. 05/13/2013, 8:08 AM

## 2013-05-14 ENCOUNTER — Observation Stay (HOSPITAL_COMMUNITY): Payer: Medicare Other

## 2013-05-14 MED ORDER — LORAZEPAM 2 MG/ML IJ SOLN
0.2500 mg | Freq: Once | INTRAMUSCULAR | Status: AC
  Administered 2013-05-14: 0.25 mg via INTRAVENOUS
  Filled 2013-05-14: qty 1

## 2013-05-14 MED ORDER — LEVALBUTEROL HCL 0.63 MG/3ML IN NEBU: 0.6300 mg | INHALATION_SOLUTION | Freq: Four times a day (QID) | RESPIRATORY_TRACT | Status: DC | PRN

## 2013-05-14 MED ORDER — PANTOPRAZOLE SODIUM 40 MG PO TBEC
40.0000 mg | DELAYED_RELEASE_TABLET | Freq: Every day | ORAL | Status: DC
  Administered 2013-05-14 – 2013-05-15 (×2): 40 mg via ORAL
  Filled 2013-05-14 (×2): qty 1

## 2013-05-14 MED ORDER — LEVOTHYROXINE SODIUM 150 MCG PO TABS
150.0000 ug | ORAL_TABLET | Freq: Every day | ORAL | Status: DC
  Administered 2013-05-15: 150 ug via ORAL
  Filled 2013-05-14 (×2): qty 1

## 2013-05-14 NOTE — Progress Notes (Signed)
Patient ID: Wanda Robinson, female   DOB: [birth year], 79 y.o.   MRN: [id number]  General Surgery - [facility] - Progress Note  POD# 3  Subjective: Patient in bed.  Lethargic.  Daughter at bedside.  No significant po intake overnight or this AM.  Mild pain.  Not able to ambulate.  Objective: Vital signs in last 24 hours: Temp:  [98.8 F (37.1 C)-99.4 F (37.4 C)] 98.8 F (37.1 C) (11/27 0535) Pulse Rate:  [78-96] 96 (11/27 0535) Resp:  [19-20] 20 (11/27 0535) BP: (132-156)/(54-76) 156/76 mmHg (11/27 0535) SpO2:  [91 %-95 %] 95 % (11/27 0535) Last BM Date: 05/12/13  Intake/Output from previous day: 11/26 0701 - 11/27 0700 In: 2475 [P.O.:120; I.V.:2355] Out: 1425 [Urine:1425]  Exam: HEENT - clear, not icteric Neck - soft Chest - coarse bilaterally Cor - RRR, rate in 90's  Abd - soft, wounds clear and dry; few BS present; G-tube capped Ext - no significant edema  Lab Results:   Recent Labs  05/12/13 0220 05/13/13 0510  WBC 6.7 8.9  HGB 14.9 14.2  HCT 42.7 41.4  PLT 198 164     Recent Labs  05/12/13 0220 05/13/13 0510  NA 139 137  K 3.8 3.7  CL 103 104  CO2 25 25  GLUCOSE 127* 139*  BUN 21 14  CREATININE 0.97 0.86  CALCIUM 9.8 8.6    Studies/Results: Dg Esophagram W/water Sol Cm  05/13/2013   CLINICAL DATA:  Status post hiatal hernia repair.  EXAM: ESOPHOGRAM/BARIUM SWALLOW  TECHNIQUE: Single contrast examination was performed using water-soluble contrast material.  COMPARISON:  CT scan from 04/20/2013.  FLUOROSCOPY TIME:  1 min and 43 seconds.  FINDINGS: The patient was given multiple swallows of water soluble contrast material. The stomach is in the expected anatomic location. This water-soluble contrast material flows readily through the esophagogastric junction. Mass-effect at the EG junction is consistent with fundoplication performed as part of the hernia reduction. There is no evidence for contrast extravasation in the region of the  esophagogastric junction to suggest leak.  The stomach is gas distended and no substantial peristalsis is evident.  IMPRESSION: Free flow of contrast through the esophagogastric junction without evidence for leak.  Stomach shows gaseous distention and decreased peristalsis, not unexpected on postoperative day 1.  I discussed these findings by telephone with Dr. Derrell Lolling at approximately 0920 hr on 05/13/2013.   Electronically Signed   By: Kennith Center M.D.   On: 05/13/2013 09:39    Assessment / Plan: 1.  Status post hiatal hernia repair with G-tube  Full liquid diet  Will ask case management to see for home care needs and discuss with daughter  Will ask PT to eval for home PT  May need short term Rehab  Cardiology following - stable from their standpoint  Velora Heckler, MD, [facility] Office: [phone]  05/14/2013

## 2013-05-14 NOTE — Evaluation (Signed)
Physical Therapy Evaluation Patient Details Name: Wanda Robinson MRN: [id number] DOB: [birth year] Today's Date: 05/14/2013 Time: 9147-8295 PT Time Calculation (min): 18 min  PT Assessment / Plan / Recommendation History of Present Illness  The patient is an 79 year old female who is recently hospitalized secondary to gastric obstruction. Patient has a known hiatal hernia with herniated stomach into the right chest. This was hospitalized secondary to pain and obstruction. This resolved after NG tube placement conservative treatments/p HH repair with Gtube  Clinical Impression  Pt will benefit from PT to address deficits below; Her family is able to provide 24hr assist initally; Pt daughter does state she has some baseline memory issues but has never been formally diagnosed; She also states she functions best in her own environment/routine; We will assist with increasing pt's mobility to decrease burden of care and risk for falls and plan on HHPT with family support    PT Assessment  Patient needs continued PT services    Follow Up Recommendations  Home health PT;Supervision for mobility/OOB    Does the patient have the potential to tolerate intense rehabilitation      Barriers to Discharge        Equipment Recommendations  Other (comment) (TBA ?needs RW)    Recommendations for Other Services     Frequency Min 3X/week    Precautions / Restrictions Precautions Precautions: Fall Required Braces or Orthoses: Other Brace/Splint Other Brace/Splint: abdominal binder   Pertinent Vitals/Pain Denies pain      Mobility  Bed Mobility Bed Mobility: Supine to Sit Supine to Sit: 3: Mod assist;HOB elevated Details for Bed Mobility Assistance: cues for self assist Transfers Transfers: Sit to Stand;Stand to Sit Sit to Stand: 3: Mod assist Stand to Sit: 4: Min assist Details for Transfer Assistance: verbal cues for hand placement and safety Ambulation/Gait Ambulation/Gait Assistance: 4:  Min assist Ambulation Distance (Feet): 80 Feet Assistive device: 2 person hand held assist Ambulation/Gait Assistance Details: verbal cues for increased step length and breathing Gait Pattern: Step-through pattern;Decreased stride length;Trunk flexed    Exercises     PT Diagnosis: Difficulty walking  PT Problem List: Decreased activity tolerance;Decreased balance;Decreased mobility;Decreased knowledge of use of DME PT Treatment Interventions: DME instruction;Gait training;Functional mobility training;Therapeutic activities;Therapeutic exercise;Patient/family education     PT Goals(Current goals can be found in the care plan section) Acute Rehab PT Goals Patient Stated Goal: home PT Goal Formulation: With patient Time For Goal Achievement: 05/21/13 Potential to Achieve Goals: Good  Visit Information  Last PT Received On: 05/14/13 Assistance Needed: +2 History of Present Illness: The patient is an 79 year old female who is recently hospitalized secondary to gastric obstruction. Patient has a known hiatal hernia with herniated stomach into the right chest. This was hospitalized secondary to pain and obstruction. This resolved after NG tube placement conservative treatments/p HH repair with Gtube       Prior Functioning  Home Living Family/patient expects to be discharged to:: Private residence Living Arrangements: Alone Available Help at Discharge: Family Type of Home: House Prior Function Level of Independence: Independent Communication Communication: No difficulties    Cognition  Cognition Arousal/Alertness: Lethargic Overall Cognitive Status: History of cognitive impairments - at baseline Memory: Decreased short-term memory    Extremity/Trunk Assessment Upper Extremity Assessment Upper Extremity Assessment: Defer to OT evaluation;Overall [facility] for tasks assessed Lower Extremity Assessment Lower Extremity Assessment: Generalized weakness   Balance Static Sitting  Balance Static Sitting - Balance Support: No upper extremity supported;Feet supported Static Sitting - Level of Assistance:  4: Min assist;5: Stand by assistance  End of Session PT - End of Session Equipment Utilized During Treatment: Gait belt Activity Tolerance: Patient tolerated treatment well Patient left: in chair;with call bell/phone within reach;with family/visitor present Nurse Communication: Mobility status  GP Functional Assessment Tool Used: clinical judgement Functional Limitation: Mobility: Walking and moving around Mobility: Walking and Moving Around Current Status [phone]): At least 20 percent but less than 40 percent impaired, limited or restricted Mobility: Walking and Moving Around Goal Status [phone]): At least 1 percent but less than 20 percent impaired, limited or restricted   [facility] 05/14/2013, 2:34 PM

## 2013-05-14 NOTE — Progress Notes (Signed)
    Subjective:  Apparently was agitated overnight - received sedative this morning. Hx obtained from daughter. Pt denies pain but not able to obtain much history.   Objective:  Vital Signs in the last 24 hours: Temp:  [98.8 F (37.1 C)-99.4 F (37.4 C)] 98.8 F (37.1 C) (11/27 0535) Pulse Rate:  [78-96] 96 (11/27 0535) Resp:  [19-20] 20 (11/27 0535) BP: (132-156)/(54-76) 156/76 mmHg (11/27 0535) SpO2:  [91 %-95 %] 95 % (11/27 0535)  Intake/Output from previous day: 11/26 0701 - 11/27 0700 In: 2475 [P.O.:120; I.V.:2355] Out: 1425 [Urine:1425]  Physical Exam: Pt is lethargic but arousable, NAD HEENT: normal Neck: JVP - normal Lungs: CTA bilaterally CV: RRR without murmur or gallop Ext: no C/C/E, distal pulses intact and equal Skin: warm/dry no rash   Lab Results:  Recent Labs  05/12/13 0220 05/13/13 0510  WBC 6.7 8.9  HGB 14.9 14.2  PLT 198 164    Recent Labs  05/12/13 0220 05/13/13 0510  NA 139 137  K 3.8 3.7  CL 103 104  CO2 25 25  GLUCOSE 127* 139*  BUN 21 14  CREATININE 0.97 0.86    Recent Labs  05/12/13 0220  TROPONINI <0.30    Tele: Sinus rhythm with frequent PVC's  Assessment/Plan:  1. Bradycardia - resolved. Now off beta-blocker 2. HTN - BP stable on current med Rx 3. Post-op hiatal hernia repair with G-tube 4. Delirium  Stable from CV perspective. Discussed with Dr Gerrit Friends. Plans noted for PT/case manager/mobilize as tolerated. Discussed with pt's daughter at bedside.   Tonny Bollman, M.D. 05/14/2013, 9:45 AM

## 2013-05-15 MED ORDER — IPRATROPIUM BROMIDE 0.02 % IN SOLN
0.5000 mg | RESPIRATORY_TRACT | Status: DC | PRN
  Administered 2013-05-15: 0.5 mg via RESPIRATORY_TRACT
  Filled 2013-05-15: qty 2.5

## 2013-05-15 MED ORDER — ALBUTEROL SULFATE (5 MG/ML) 0.5% IN NEBU
2.5000 mg | INHALATION_SOLUTION | RESPIRATORY_TRACT | Status: DC | PRN
  Administered 2013-05-15: 09:00:00 2.5 mg via RESPIRATORY_TRACT
  Filled 2013-05-15: qty 0.5

## 2013-05-15 MED ORDER — HYDRALAZINE HCL 25 MG PO TABS: 25.0000 mg | ORAL_TABLET | Freq: Three times a day (TID) | ORAL | Status: DC

## 2013-05-15 NOTE — Progress Notes (Signed)
Dtr Kennyth Arnold in room, explained discharge instructions and gave prescription. Instructed NOT to give labetalol to mother and to pick up prescription for hydralazine from [facility].

## 2013-05-15 NOTE — Progress Notes (Signed)
    Subjective:  Feels much better this morning. No chest pain or shortness of breath. No palpitations.   Objective:  Vital Signs in the last 24 hours: Temp:  [97.2 F (36.2 C)-98.2 F (36.8 C)] 97.2 F (36.2 C) (11/28 0532) Pulse Rate:  [89-99] 89 (11/28 0532) Resp:  [20] 20 (11/28 0532) BP: (148-153)/(64-73) 153/68 mmHg (11/28 0532) SpO2:  [93 %-95 %] 93 % (11/28 0532)  Intake/Output from previous day: 11/27 0701 - 11/28 0700 In: 1730 [P.O.:360; I.V.:1370] Out: 2050 [Urine:2050]  Physical Exam: Pt is alert and oriented, pleasant elderly woman in NAD HEENT: normal Neck: JVP - normal Lungs: expiratory wheezing CV: RRR with grade 2/6 systolic murmur at the LSB Abd: soft, NT, Positive BS, no hepatomegaly Ext: no C/C/E, distal pulses intact and equal Skin: warm/dry no rash   Lab Results:  Recent Labs  05/13/13 0510  WBC 8.9  HGB 14.2  PLT 164    Recent Labs  05/13/13 0510  NA 137  K 3.7  CL 104  CO2 25  GLUCOSE 139*  BUN 14  CREATININE 0.86   No results found for this basename: TROPONINI, CK, MB,  in the last 72 hours  Tele: Sinus rhythm, PVC's, personally reviewed.  Assessment/Plan:  1. Bradycardia - resolved. Now off beta-blocker  2. HTN - BP stable on current med Rx  3. Post-op hiatal hernia repair with G-tube  4. Delirium - greatly improved.  Meds/vitals reviewed. Pt much better today. Continued stability from cardiac perspective. Recommend continue current antihypertensive regimen. OK to d/c tele. Please call if any other cardiac issues arise. thx  Tonny Bollman, M.D. 05/15/2013, 7:12 AM

## 2013-05-15 NOTE — Progress Notes (Signed)
3 Days Post-Op  Subjective: Pt feeling much better today.  Some disorientation overnight per her daughter who is at [facility].  Working on her IS.    Objective: Vital signs in last 24 hours: Temp:  [97.2 F (36.2 C)-98.2 F (36.8 C)] 97.2 F (36.2 C) (11/28 0532) Pulse Rate:  [89-99] 89 (11/28 0532) Resp:  [20] 20 (11/28 0532) BP: (148-153)/(64-73) 153/68 mmHg (11/28 0532) SpO2:  [93 %-95 %] 93 % (11/28 0532) Last BM Date: 05/12/13  Intake/Output from previous day: 11/27 0701 - 11/28 0700 In: 1730 [P.O.:360; I.V.:1370] Out: 2050 [Urine:2050] Intake/Output this shift:    General appearance: alert and cooperative Cardio: regular rate and rhythm, S1, S2 normal, no murmur, click, rub or gallop GI: soft, NT, ND active BS, G-tube in place.  Lab Results:   Recent Labs  05/13/13 0510  WBC 8.9  HGB 14.2  HCT 41.4  PLT 164   BMET  Recent Labs  05/13/13 0510  NA 137  K 3.7  CL 104  CO2 25  GLUCOSE 139*  BUN 14  CREATININE 0.86  CALCIUM 8.6   PT/INR No results found for this basename: LABPROT, INR,  in the last 72 hours ABG No results found for this basename: PHART, PCO2, PO2, HCO3,  in the last 72 hours  Studies/Results: Dg Chest 2 View  05/14/2013   CLINICAL DATA:  Wheezing  EXAM: CHEST  2 VIEW  COMPARISON:  Prior radiograph from 05/12/2013  FINDINGS: Median sternotomy wires with underlying CABG markers are again noted, unchanged. Mild cardiomegaly is stable.  The lungs are hypoinflated. There are increased bibasilar airspace opacities, which may reflect atelectasis and/ or bronchovascular crowding with hyperinflation. Dense opacity within the retrocardiac left lower lobe may reflect atelectasis or infiltrate. No definite pleural effusion. No pulmonary edema. No pneumothorax.  Osseous structures are unchanged.  IMPRESSION: Hypoinflation with increased bibasilar airspace opacities, left greater than right. While these findings may reflect atelectasis and/ or bronchovascular  crowding related to hypoinflation, possible infiltrates could also be considered, particularly at the left lung base.   Electronically Signed   By: Rise Mu M.D.   On: 05/14/2013 21:59   Dg Esophagram W/water Sol Cm  05/13/2013   CLINICAL DATA:  Status post hiatal hernia repair.  EXAM: ESOPHOGRAM/BARIUM SWALLOW  TECHNIQUE: Single contrast examination was performed using water-soluble contrast material.  COMPARISON:  CT scan from 04/20/2013.  FLUOROSCOPY TIME:  1 min and 43 seconds.  FINDINGS: The patient was given multiple swallows of water soluble contrast material. The stomach is in the expected anatomic location. This water-soluble contrast material flows readily through the esophagogastric junction. Mass-effect at the EG junction is consistent with fundoplication performed as part of the hernia reduction. There is no evidence for contrast extravasation in the region of the esophagogastric junction to suggest leak.  The stomach is gas distended and no substantial peristalsis is evident.  IMPRESSION: Free flow of contrast through the esophagogastric junction without evidence for leak.  Stomach shows gaseous distention and decreased peristalsis, not unexpected on postoperative day 1.  I discussed these findings by telephone with Dr. Derrell Lolling at approximately 0920 hr on 05/13/2013.   Electronically Signed   By: Kennith Center M.D.   On: 05/13/2013 09:39    Anti-infectives: Anti-infectives   Start     Dose/Rate Route Frequency Ordered Stop   05/12/13 0531  ceFAZolin (ANCEF) IVPB 2 g/50 mL premix     2 g 100 mL/hr over 30 Minutes Intravenous On call to O.R.  05/12/13 0531 05/12/13 0750      Assessment/Plan: s/p Procedure(s): LAPAROSCOPIC REPAIR OF HIATAL HERNIA,  (N/A) G TUBE PLACEMENT (N/A) INSERTION OF MESH (N/A) Cont Po PT CM will likely need to set up Home PT Pt will be going home, but has the support of her daughter who lives within walking distance. OOBTC.  LOS: 3 days     Marigene Ehlers., [facility] 05/15/2013

## 2013-05-16 ENCOUNTER — Other Ambulatory Visit: Payer: Self-pay

## 2013-05-16 ENCOUNTER — Emergency Department (HOSPITAL_COMMUNITY): Payer: Medicare Other

## 2013-05-16 ENCOUNTER — Telehealth (INDEPENDENT_AMBULATORY_CARE_PROVIDER_SITE_OTHER): Payer: Self-pay | Admitting: General Surgery

## 2013-05-16 ENCOUNTER — Inpatient Hospital Stay (HOSPITAL_COMMUNITY)
Admission: EM | Admit: 2013-05-16 | Discharge: 2013-05-21 | DRG: 862 | Disposition: A | Discharge status: Skilled Nursing Facility | Payer: Medicare Other | Attending: General Surgery | Admitting: General Surgery

## 2013-05-16 ENCOUNTER — Encounter (HOSPITAL_COMMUNITY): Payer: Self-pay | Admitting: Emergency Medicine

## 2013-05-16 DIAGNOSIS — F0390 Unspecified dementia without behavioral disturbance: Secondary | ICD-10-CM | POA: Diagnosis present

## 2013-05-16 DIAGNOSIS — R509 Fever, unspecified: Secondary | ICD-10-CM | POA: Diagnosis present

## 2013-05-16 DIAGNOSIS — I129 Hypertensive chronic kidney disease with stage 1 through stage 4 chronic kidney disease, or unspecified chronic kidney disease: Secondary | ICD-10-CM | POA: Diagnosis present

## 2013-05-16 DIAGNOSIS — I498 Other specified cardiac arrhythmias: Secondary | ICD-10-CM

## 2013-05-16 DIAGNOSIS — J189 Pneumonia, unspecified organism: Secondary | ICD-10-CM | POA: Diagnosis present

## 2013-05-16 DIAGNOSIS — I359 Nonrheumatic aortic valve disorder, unspecified: Secondary | ICD-10-CM | POA: Diagnosis present

## 2013-05-16 DIAGNOSIS — Z8249 Family history of ischemic heart disease and other diseases of the circulatory system: Secondary | ICD-10-CM

## 2013-05-16 DIAGNOSIS — Y838 Other surgical procedures as the cause of abnormal reaction of the patient, or of later complication, without mention of misadventure at the time of the procedure: Secondary | ICD-10-CM | POA: Diagnosis present

## 2013-05-16 DIAGNOSIS — J449 Chronic obstructive pulmonary disease, unspecified: Secondary | ICD-10-CM

## 2013-05-16 DIAGNOSIS — T8140XA Infection following a procedure, unspecified, initial encounter: Principal | ICD-10-CM | POA: Diagnosis present

## 2013-05-16 DIAGNOSIS — R109 Unspecified abdominal pain: Secondary | ICD-10-CM

## 2013-05-16 DIAGNOSIS — R001 Bradycardia, unspecified: Secondary | ICD-10-CM

## 2013-05-16 DIAGNOSIS — I2510 Atherosclerotic heart disease of native coronary artery without angina pectoris: Secondary | ICD-10-CM | POA: Diagnosis present

## 2013-05-16 DIAGNOSIS — R627 Adult failure to thrive: Secondary | ICD-10-CM | POA: Diagnosis present

## 2013-05-16 DIAGNOSIS — E876 Hypokalemia: Secondary | ICD-10-CM | POA: Diagnosis present

## 2013-05-16 DIAGNOSIS — I252 Old myocardial infarction: Secondary | ICD-10-CM

## 2013-05-16 DIAGNOSIS — J90 Pleural effusion, not elsewhere classified: Secondary | ICD-10-CM | POA: Diagnosis present

## 2013-05-16 DIAGNOSIS — E785 Hyperlipidemia, unspecified: Secondary | ICD-10-CM | POA: Diagnosis present

## 2013-05-16 DIAGNOSIS — Z87891 Personal history of nicotine dependence: Secondary | ICD-10-CM

## 2013-05-16 DIAGNOSIS — E039 Hypothyroidism, unspecified: Secondary | ICD-10-CM

## 2013-05-16 DIAGNOSIS — L0390 Cellulitis, unspecified: Secondary | ICD-10-CM

## 2013-05-16 DIAGNOSIS — N182 Chronic kidney disease, stage 2 (mild): Secondary | ICD-10-CM | POA: Diagnosis present

## 2013-05-16 DIAGNOSIS — Z951 Presence of aortocoronary bypass graft: Secondary | ICD-10-CM

## 2013-05-16 DIAGNOSIS — Z931 Gastrostomy status: Secondary | ICD-10-CM

## 2013-05-16 DIAGNOSIS — L02219 Cutaneous abscess of trunk, unspecified: Secondary | ICD-10-CM | POA: Diagnosis present

## 2013-05-16 DIAGNOSIS — I10 Essential (primary) hypertension: Secondary | ICD-10-CM

## 2013-05-16 DIAGNOSIS — J4489 Other specified chronic obstructive pulmonary disease: Secondary | ICD-10-CM

## 2013-05-16 DIAGNOSIS — R0902 Hypoxemia: Secondary | ICD-10-CM | POA: Diagnosis present

## 2013-05-16 LAB — URINALYSIS, ROUTINE W REFLEX MICROSCOPIC
Bilirubin Urine: NEGATIVE
Glucose, UA: NEGATIVE mg/dL
Hgb urine dipstick: NEGATIVE
Ketones, ur: NEGATIVE mg/dL
Leukocytes, UA: NEGATIVE
Protein, ur: 30 mg/dL — AB
pH: 7 (ref 5.0–8.0)

## 2013-05-16 LAB — CBC WITH DIFFERENTIAL/PLATELET
Basophils Relative: 0 % (ref 0–1)
Eosinophils Absolute: 0.2 10*3/uL (ref 0.0–0.7)
HCT: 41.5 % (ref 36.0–46.0)
Lymphocytes Relative: 8 % — ABNORMAL LOW (ref 12–46)
MCH: 31.2 pg (ref 26.0–34.0)
MCHC: 35.9 g/dL (ref 30.0–36.0)
MCV: 86.8 fL (ref 78.0–100.0)
Monocytes Absolute: 0.9 10*3/uL (ref 0.1–1.0)
Neutrophils Relative %: 83 % — ABNORMAL HIGH (ref 43–77)
Platelets: 240 10*3/uL (ref 150–400)

## 2013-05-16 LAB — COMPREHENSIVE METABOLIC PANEL
ALT: 16 U/L (ref 0–35)
Albumin: 2.6 g/dL — ABNORMAL LOW (ref 3.5–5.2)
Alkaline Phosphatase: 70 U/L (ref 39–117)
CO2: 28 mEq/L (ref 19–32)
Chloride: 94 mEq/L — ABNORMAL LOW (ref 96–112)
Potassium: 2.8 mEq/L — ABNORMAL LOW (ref 3.5–5.1)
Sodium: 131 mEq/L — ABNORMAL LOW (ref 135–145)
Total Protein: 6.1 g/dL (ref 6.0–8.3)

## 2013-05-16 LAB — LIPASE, BLOOD: Lipase: 15 U/L (ref 11–59)

## 2013-05-16 LAB — URINE MICROSCOPIC-ADD ON

## 2013-05-16 MED ORDER — SODIUM CHLORIDE 0.9 % IV SOLN: INTRAVENOUS | Status: DC

## 2013-05-16 MED ORDER — HYDROCODONE-ACETAMINOPHEN 7.5-325 MG/15ML PO SOLN
10.0000 mL | Freq: Four times a day (QID) | ORAL | Status: DC | PRN
  Administered 2013-05-16 – 2013-05-18 (×5): 10 mL
  Filled 2013-05-16 (×6): qty 15

## 2013-05-16 MED ORDER — HYDRALAZINE HCL 25 MG PO TABS
25.0000 mg | ORAL_TABLET | Freq: Three times a day (TID) | ORAL | Status: DC
  Administered 2013-05-16 – 2013-05-21 (×14): 25 mg
  Filled 2013-05-16 (×17): qty 1

## 2013-05-16 MED ORDER — LEVOTHYROXINE SODIUM 150 MCG PO TABS
150.0000 ug | ORAL_TABLET | Freq: Every day | ORAL | Status: DC
  Administered 2013-05-17 – 2013-05-21 (×5): 150 ug
  Filled 2013-05-16 (×6): qty 1

## 2013-05-16 MED ORDER — IOHEXOL 300 MG/ML  SOLN
100.0000 mL | Freq: Once | INTRAMUSCULAR | Status: AC | PRN
  Administered 2013-05-16: 100 mL via INTRAVENOUS

## 2013-05-16 MED ORDER — ALBUTEROL SULFATE (5 MG/ML) 0.5% IN NEBU: 2.5000 mg | INHALATION_SOLUTION | Freq: Four times a day (QID) | RESPIRATORY_TRACT | Status: DC | PRN

## 2013-05-16 MED ORDER — ONDANSETRON HCL 4 MG/2ML IJ SOLN: 4.0000 mg | Freq: Four times a day (QID) | INTRAMUSCULAR | Status: DC | PRN

## 2013-05-16 MED ORDER — POTASSIUM CHLORIDE 20 MEQ/15ML (10%) PO LIQD
20.0000 meq | Freq: Two times a day (BID) | ORAL | Status: DC
  Administered 2013-05-16 – 2013-05-19 (×6): 20 meq
  Filled 2013-05-16 (×7): qty 15

## 2013-05-16 MED ORDER — FUROSEMIDE 10 MG/ML IJ SOLN
40.0000 mg | Freq: Two times a day (BID) | INTRAMUSCULAR | Status: DC
  Administered 2013-05-16 – 2013-05-18 (×4): 40 mg via INTRAVENOUS
  Filled 2013-05-16 (×6): qty 4

## 2013-05-16 MED ORDER — PIPERACILLIN-TAZOBACTAM 3.375 G IVPB
3.3750 g | Freq: Three times a day (TID) | INTRAVENOUS | Status: DC
  Administered 2013-05-16 – 2013-05-19 (×8): 3.375 g via INTRAVENOUS
  Filled 2013-05-16 (×10): qty 50

## 2013-05-16 MED ORDER — POTASSIUM CHLORIDE CRYS ER 20 MEQ PO TBCR
40.0000 meq | EXTENDED_RELEASE_TABLET | Freq: Once | ORAL | Status: AC
  Administered 2013-05-16: 40 meq via ORAL
  Filled 2013-05-16: qty 2

## 2013-05-16 MED ORDER — POTASSIUM CHLORIDE 10 MEQ/100ML IV SOLN
10.0000 meq | INTRAVENOUS | Status: AC
  Administered 2013-05-16 (×2): 10 meq via INTRAVENOUS
  Filled 2013-05-16 (×2): qty 100

## 2013-05-16 MED ORDER — AMLODIPINE BESYLATE 10 MG PO TABS
10.0000 mg | ORAL_TABLET | Freq: Every day | ORAL | Status: DC
  Administered 2013-05-17 – 2013-05-21 (×5): 10 mg
  Filled 2013-05-16 (×6): qty 1

## 2013-05-16 MED ORDER — KCL IN DEXTROSE-NACL 20-5-0.9 MEQ/L-%-% IV SOLN
INTRAVENOUS | Status: DC
  Administered 2013-05-16: 21:00:00 10 mL/h via INTRAVENOUS
  Filled 2013-05-16 (×2): qty 1000

## 2013-05-16 MED ORDER — IOHEXOL 300 MG/ML  SOLN
50.0000 mL | Freq: Once | INTRAMUSCULAR | Status: AC | PRN
  Administered 2013-05-16: 50 mL via ORAL

## 2013-05-16 MED ORDER — MORPHINE SULFATE 2 MG/ML IJ SOLN: 1.0000 mg | INTRAMUSCULAR | Status: DC | PRN

## 2013-05-16 MED ORDER — SODIUM CHLORIDE 0.9 % IV BOLUS (SEPSIS)
500.0000 mL | Freq: Once | INTRAVENOUS | Status: AC
  Administered 2013-05-16: 1000 mL via INTRAVENOUS

## 2013-05-16 MED ORDER — ENOXAPARIN SODIUM 40 MG/0.4ML ~~LOC~~ SOLN
40.0000 mg | SUBCUTANEOUS | Status: DC
  Administered 2013-05-16 – 2013-05-20 (×5): 40 mg via SUBCUTANEOUS
  Filled 2013-05-16 (×6): qty 0.4

## 2013-05-16 NOTE — Consult Note (Signed)
Wanda Robinson is an 79 y.o. female.  HPI: s/p lap hiatal hernia repair with gastrostomy tube placement.  She was discharged yesterday.  Her family brought her in today with lethargy and not eating.  She is able to eat but not wanting to take anything in.  She has also had a cough and wheezing.  Temp of 100.  She is not having much abdominal pain.    Past Medical History  Diagnosis Date  . MI (myocardial infarction)   . Hypertension   . Hyperlipidemia   . Ulcer   . Colon polyps     removed with colonoscopy  . Aortic stenosis   . CAD (coronary artery disease)   . Gastric outlet obstruction     10'14- hospital stay of several days([facility])  . Hiatal hernia   . Dementia 05-05-13    short term memory issues"no dx"  . Hypothyroidism   . Chronic kidney disease 05-05-13    renal dysfunction- has improved from 3'11  . Anemia     past hx. 2011  . Renal artery stenosis     Previous stenting of renal arteries bilaterally    Past Surgical History  Procedure Laterality Date  . Coronary artery bypass graft  2002    Dr Dorris Fetch  . Cholecystectomy      open  . Carotid endarterectomy Left 2011    Dr. Darrick Penna  . Hiatal hernia repair N/A 05/12/2013    Procedure: LAPAROSCOPIC REPAIR OF HIATAL HERNIA, ;  Surgeon: Axel Filler, MD;  Location: [facility]S;  Service: General;  Laterality: N/A;  . Gastrostomy N/A 05/12/2013    Procedure: G TUBE PLACEMENT;  Surgeon: Axel Filler, MD;  Location: [facility]S;  Service: General;  Laterality: N/A;  . Insertion of mesh N/A 05/12/2013    Procedure: INSERTION OF MESH;  Surgeon: Axel Filler, MD;  Location: [facility]S;  Service: General;  Laterality: N/A;    Family History  Problem Relation Age of Onset  . Heart disease Father     CAD    Social History:  reports that she quit smoking about 24 years ago. She has never used smokeless tobacco. She reports that she does not drink alcohol or use illicit drugs.  Allergies:  Allergies  Allergen Reactions   . Codeine Phosphate Nausea Only    Medications: I have reviewed the patient's current medications.  No results found for this or any previous visit (from the past 48 hour(s)).  Dg Chest 2 View  05/14/2013   CLINICAL DATA:  Wheezing  EXAM: CHEST  2 VIEW  COMPARISON:  Prior radiograph from 05/12/2013  FINDINGS: Median sternotomy wires with underlying CABG markers are again noted, unchanged. Mild cardiomegaly is stable.  The lungs are hypoinflated. There are increased bibasilar airspace opacities, which may reflect atelectasis and/ or bronchovascular crowding with hyperinflation. Dense opacity within the retrocardiac left lower lobe may reflect atelectasis or infiltrate. No definite pleural effusion. No pulmonary edema. No pneumothorax.  Osseous structures are unchanged.  IMPRESSION: Hypoinflation with increased bibasilar airspace opacities, left greater than right. While these findings may reflect atelectasis and/ or bronchovascular crowding related to hypoinflation, possible infiltrates could also be considered, particularly at the left lung base.   Electronically Signed   By: Rise Mu M.D.   On: 05/14/2013 21:59    ROS Blood pressure 128/62, pulse 93, temperature 98.8 F (37.1 C), temperature source Oral, resp. rate 19, SpO2 91.00%. General appearance: cooperative and no distress Resp: clear bilat. anteriorly Cardio:  normal rate, regualr GI: soft, not much tenderness on exam, ND, wounds okay but LUQ gtube with foul smelling discharge and slight erythema just to the left of the tube concerning for possible abscess or cellulitis, no crepitus or evidence of necrotizing infection Extremities: extremities normal, atraumatic, no cyanosis or edema Neurologic: Mental status: Alert, oriented, thought content appropriate, she is responsive and seems very appropriate to my exam  Assessment/Plan: Failure to thrive She is s/p lap hiatal hernia repair and gastrostomy.  Does she was discharged  yesterday she comes in today with failure to thrive and wheezing and hypoxia.  I think that she should be admitted for observation and possible treatment for pneumonia and cellulitis of the abdominal wall. I am concerned that she may be developing an abscess of the abdominal wall near her gastrostomy tube. I have reviewed the CT images with the radiologist and at this time, he did not feel that there was any definite abscess of the abdominal wall. There was also concern for possible distal esophageal leak although followup CT scan with oral contrast did not demonstrate any evidence of leak. She does not look septic at this time although I think that she should come in for observation. I would recommend that she remain n.p.o. For now in preparation for possible incision and drainage of abdominal wall if her erythema does not improve.  I would also keep her n.p.o. Given the question of possible distal esophageal leak. Her gastrostomy tube can be used for nutrition. We'll start her on some antibiotics. We will also ask for hospitalist consult for management of the medical issues.  Lodema Pilot DAVID 05/16/2013, 2:04 PM

## 2013-05-16 NOTE — ED Notes (Signed)
Patient transported to X-ray 

## 2013-05-16 NOTE — ED Notes (Signed)
Patient transported to CT 

## 2013-05-16 NOTE — ED Notes (Signed)
@   RN's attempted to start an IV, no success; charge RN at bedside to assess.

## 2013-05-16 NOTE — ED Provider Notes (Addendum)
CSN: [id number]     Arrival date & time 05/16/13  1322 History   First MD Initiated Contact with Patient 05/16/13 1325     Chief Complaint  Patient presents with  . fever, fatigue    (Consider location/radiation/quality/duration/timing/severity/associated sxs/prior Treatment) HPI Comments: Wanda Robinson is a 79 y.o. female presents for evaluation of decreased appetite, fever, and weakness. She is discharged, from the hospital yesterday after a laparoscopic hiatal hernia repair. She did not eat much in the hospital, and has had only a few spoonfuls of since leaving the hospital. She had a oral temperature 100.0 today. She has had cough, nonproductive. She has not complained of dysuria. There's been no diarrhea. Patient is here with her daughter, who answers all questions. Her last pain medicine was 8:00 this morning. According to the daughter, she does not seem to be in pain at this time. There are no other known modifying factors.   The history is provided by the patient.    Past Medical History  Diagnosis Date  . MI (myocardial infarction)   . Hypertension   . Hyperlipidemia   . Ulcer   . Colon polyps     removed with colonoscopy  . Aortic stenosis   . CAD (coronary artery disease)   . Gastric outlet obstruction     10'14- hospital stay of several days([facility])  . Hiatal hernia   . Dementia 05-05-13    short term memory issues"no dx"  . Hypothyroidism   . Chronic kidney disease 05-05-13    renal dysfunction- has improved from 3'11  . Anemia     past hx. 2011  . Renal artery stenosis     Previous stenting of renal arteries bilaterally   Past Surgical History  Procedure Laterality Date  . Coronary artery bypass graft  2002    Dr Dorris Fetch  . Cholecystectomy      open  . Carotid endarterectomy Left 2011    Dr. Darrick Penna  . Hiatal hernia repair N/A 05/12/2013    Procedure: LAPAROSCOPIC REPAIR OF HIATAL HERNIA, ;  Surgeon: Axel Filler, MD;  Location: [facility];  Service:  General;  Laterality: N/A;  . Gastrostomy N/A 05/12/2013    Procedure: G TUBE PLACEMENT;  Surgeon: Axel Filler, MD;  Location: [facility];  Service: General;  Laterality: N/A;  . Insertion of mesh N/A 05/12/2013    Procedure: INSERTION OF MESH;  Surgeon: Axel Filler, MD;  Location: [facility];  Service: General;  Laterality: N/A;   Family History  Problem Relation Age of Onset  . Heart disease Father     CAD   History  Substance Use Topics  . Smoking status: Former Smoker    Quit date: 05/05/1989  . Smokeless tobacco: Never Used  . Alcohol Use: No   OB History   Grav Para Term Preterm Abortions TAB SAB Ect Mult Living                 Review of Systems  All other systems reviewed and are negative.    Allergies  Codeine phosphate  Home Medications   Current Outpatient Rx  Name  Route  Sig  Dispense  Refill  . amLODipine (NORVASC) 10 MG tablet   Oral   Take 10 mg by mouth daily with breakfast.          . aspirin EC 81 MG tablet   Oral   Take 81 mg by mouth daily.         . Cholecalciferol (VITAMIN  D-3) 5000 UNITS TABS   Oral   Take 5,000 Units by mouth daily.         . ferrous sulfate 325 (65 FE) MG tablet   Oral   Take 325 mg by mouth 2 (two) times daily.         . hydrALAZINE (APRESOLINE) 25 MG tablet   Oral   Take 1 tablet (25 mg total) by mouth every 8 (eight) hours.   90 tablet   1   . hydrochlorothiazide (HYDRODIURIL) 25 MG tablet   Oral   Take 25 mg by mouth daily with breakfast.          . HYDROcodone-acetaminophen (HYCET) 7.5-325 mg/15 ml solution   Oral   Take 15 mLs by mouth 4 (four) times daily as needed for moderate pain.   120 mL   0   . labetalol (NORMODYNE) 200 MG tablet   Oral   Take 200 mg by mouth 2 (two) times daily.         Marland Kitchen levothyroxine (SYNTHROID, LEVOTHROID) 150 MCG tablet   Oral   Take 150 mcg by mouth daily before breakfast.         . naproxen sodium (ANAPROX) 220 MG tablet   Oral   Take 220 mg by  mouth as needed (pain).          . pravastatin (PRAVACHOL) 80 MG tablet   Oral   Take 80 mg by mouth daily.         . Probiotic Product (PROBIOTIC DAILY PO)   Oral   Take 1 tablet by mouth daily.          BP 148/66  Pulse 94  Temp(Src) 98.5 F (36.9 C) (Oral)  Resp 19  SpO2 97% Physical Exam  Nursing note and vitals reviewed. Constitutional: She appears well-developed.  Elderly, frail  HENT:  Head: Normocephalic and atraumatic.  Eyes: Conjunctivae and EOM are normal. Pupils are equal, round, and reactive to light.  Neck: Normal range of motion and phonation normal. Neck supple.  Cardiovascular: Normal rate, regular rhythm and intact distal pulses.   Pulmonary/Chest: Effort normal and breath sounds normal. She exhibits no tenderness.  Abdominal: Soft. She exhibits no distension. There is no tenderness. There is no guarding.  Multiple healing laparoscopic scars, without significant drainage. There is a G-tube ostomy, and the left upper quadrant. Just to the left lateral aspect of the ostomy. There is redness and induration that extends to the left posterior axillary line, a stripe of about 6 cm width; this area has mild, focal tenderness. There is no general abdominal tenderness.  Musculoskeletal: Normal range of motion.  Neurological: She is alert. No cranial nerve deficit. She exhibits normal muscle tone. Coordination normal.  Skin: Skin is warm and dry.  Psychiatric: She has a normal mood and affect. Her behavior is normal.    ED Course  Procedures (including critical care time) Medications  0.9 %  sodium chloride infusion (not administered)  potassium chloride 10 mEq in 100 mL IVPB (10 mEq Intravenous New Bag/Given 05/16/13 1553)  sodium chloride 0.9 % bolus 500 mL (1,000 mLs Intravenous New Bag/Given 05/16/13 1552)  iohexol (OMNIPAQUE) 300 MG/ML solution 50 mL (50 mLs Oral Contrast Given 05/16/13 1406)  potassium chloride SA (K-DUR,KLOR-CON) CR tablet 40 mEq (40 mEq  Oral Given 05/16/13 1553)  iohexol (OMNIPAQUE) 300 MG/ML solution 100 mL (100 mLs Intravenous Contrast Given 05/16/13 1528)    Patient Vitals for the past 24 hrs:  BP  Temp Temp src Pulse Resp SpO2  05/16/13 1555 148/66 mmHg 98.5 F (36.9 C) Oral 94 19 97 %  05/16/13 1354 128/62 mmHg 98.8 F (37.1 C) Oral 93 19 91 %     The case was discussed with, and the patient was seen with, Dr. Biagio Quint from surgery. We examined the patient together, initially. I did discuss case with him after return of CT scanning, and he will evaluate the patient in the ED, and likely admit her and arrange for further disposition, as needed.   Labs Review Labs Reviewed  CBC WITH DIFFERENTIAL - Abnormal; Notable for the following:    WBC 10.8 (*)    Neutrophils Relative % 83 (*)    Neutro Abs 8.9 (*)    Lymphocytes Relative 8 (*)    All other components within normal limits  COMPREHENSIVE METABOLIC PANEL - Abnormal; Notable for the following:    Sodium 131 (*)    Potassium 2.8 (*)    Chloride 94 (*)    Glucose, Bld 129 (*)    Albumin 2.6 (*)    GFR calc non Af Amer 56 (*)    GFR calc Af Amer 65 (*)    All other components within normal limits  URINE CULTURE  CULTURE, BLOOD (ROUTINE X 2)  CULTURE, BLOOD (ROUTINE X 2)  LIPASE, BLOOD  URINALYSIS, ROUTINE W REFLEX MICROSCOPIC   Imaging Review Dg Chest 2 View  05/16/2013   CLINICAL DATA:  Fever and cough.  EXAM: CHEST - 2 VIEW  COMPARISON:  05/14/2013  FINDINGS: Previous CABG. Heart size upper limits normal. Moderate bilateral pleural effusions with consolidation/ atelectasis in the lower lobes, left greater than right. No overt interstitial edema.  IMPRESSION: Persistent pleural effusions and bibasilar consolidation/ atelectasis.   Electronically Signed   By: Oley Balm M.D.   On: 05/16/2013 14:24   Dg Chest 2 View  05/14/2013   CLINICAL DATA:  Wheezing  EXAM: CHEST  2 VIEW  COMPARISON:  Prior radiograph from 05/12/2013  FINDINGS: Median sternotomy  wires with underlying CABG markers are again noted, unchanged. Mild cardiomegaly is stable.  The lungs are hypoinflated. There are increased bibasilar airspace opacities, which may reflect atelectasis and/ or bronchovascular crowding with hyperinflation. Dense opacity within the retrocardiac left lower lobe may reflect atelectasis or infiltrate. No definite pleural effusion. No pulmonary edema. No pneumothorax.  Osseous structures are unchanged.  IMPRESSION: Hypoinflation with increased bibasilar airspace opacities, left greater than right. While these findings may reflect atelectasis and/ or bronchovascular crowding related to hypoinflation, possible infiltrates could also be considered, particularly at the left lung base.   Electronically Signed   By: Rise Mu M.D.   On: 05/14/2013 21:59   Ct Abdomen Pelvis W Contrast  05/16/2013   CLINICAL DATA:  Decreased appetite. Recent hernia repair. Postop day number 4.  EXAM: CT ABDOMEN AND PELVIS WITH CONTRAST  TECHNIQUE: Multidetector CT imaging of the abdomen and pelvis was performed using the standard protocol following bolus administration of intravenous contrast.  CONTRAST:  50mL OMNIPAQUE IOHEXOL 300 MG/ML SOLN, OMNIPAQUE IOHEXOL 300 MG/ML SOLN  COMPARISON:  04/20/2013  FINDINGS: The large hiatal hernia has been repaired. The stomach is now all within the abdomen, below the diaphragm. The distal esophagus is abnormal. There is wall thickening and low density within the esophageal wall. There is a complex fluid collection to the right of the distal esophagus measuring 9.6 x 6.4 cm in the lower medial right hemi thorax. There are hyperdense areas  layering inferiorly. The esophageal wall is inseparable from the fluid collection. There is a focal blush of high-density on image number 1 of this study adjacent to the esophagus. These findings raise the possibility of esophageal leak.  Small bilateral pleural effusions are associated with bibasilar  atelectasis versus airspace disease. No pneumothorax. Stable appearance of the heart.  The gallbladder is absent. There is associated dilatation of the common bile duct and biliary tree, especially the left lobe. There are several benign appearing cysts within the left lobe and to a lesser degree the right lobe of the liver.  Spleen, adrenal glands are within normal limits. Chronic changes of the kidneys bilaterally. Tiny hypodensities in the lower pole of left kidney are too small to characterize. Simple cyst in the right kidney. Calculus in the lower pole of the right kidney.  The pancreas is atrophic.  No obvious pancreatic head mass.  A gastrostomy tube is in place. There is gas within the abdominal wall compatible with recent intervention. The bulb of the gastrostomy tube is appropriately positioned within the lumen of the stomach. There is a small amount of free intraperitoneal gas adjacent to the gastrostomy site.  There is gas in the subcutaneous fat bilaterally, more so on the right of unknown significance.  Left inguinal hernia is again noted and now contains a small amount of free fluid.  Atherosclerotic changes in the abdomen are stable. Juxtarenal abdominal aortic aneurysm measures 3.0 cm. This is state  Bladder is unremarkable.  Images of the uterus demonstrate finding suspicious for endometrial thickening diverticulosis of the sigmoid colon is present without evidence of acute diverticulitis. Mild distention of the colon.  Degenerative changes of the lumbar spine. No vertebral compression deformity. Mild dextroscoliosis of the lumbar spine.   Electronically Signed   By: Maryclare Bean M.D.   On: 05/16/2013 16:08    EKG Interpretation   None       Date: 05/16/13  Rate: 93  Rhythm: normal sinus rhythm  QRS Axis: normal  PR and QT Intervals: PR prolonged  ST/T Wave abnormalities: nonspecific T wave changes  PR and QRS Conduction Disutrbances:first-degree A-V block   Narrative Interpretation:    Old EKG Reviewed: changes noted- QT shorter   MDM   1. Fever   2. Abdominal  pain, other specified site   3. Hypokalemia      Abdominal pain, with fever, and anorexia. Initial CT evaluation is consistent with a distal esophageal perforation. The finding is nonspecific and will need additional evaluation. The patient is being seen and managed by general surgery, Dr. Biagio Quint in emergency department.    Nursing Notes Reviewed/ Care Coordinated, and agree without changes. Applicable Imaging Reviewed.  Interpretation of Laboratory Data incorporated into ED treatment  Disposition- as per Dr. Maree Erie, MD 05/16/13 1616  Flint Melter, MD 05/16/13 [phone]

## 2013-05-16 NOTE — ED Notes (Signed)
Pt presents to ED with c/o fever, fatigue since last night. Daughter sts pt was released from hospital yesterday (had hiatal hernia surgery) and had no appetite, she is not drinking and is drowsy and lethargic.

## 2013-05-16 NOTE — Telephone Encounter (Signed)
Her family called saying that she was lethargic and having low temps with wheezing.  She hasnt eaten or drank anything since she left the hospital.  She also has a cough.  I recommended that she come to the ER for evaluation and r/o pneumonia.

## 2013-05-16 NOTE — Consult Note (Addendum)
[facility] Medical Consultation  Wanda MCKENNY Robinson:[id number] DOB: 08-May-1929 DOA: 05/16/2013 PCP: Dois Davenport., MD   Requesting physician: Kathie Dike, CCS Date of consultation: 11/29 Reason for consultation: lethargy, poor Po intake  Impression/Recommendations  1. Low grade fever -Postop day #4, status post laparoscopic hiatal hernia repair -differential includes postop site/evolving abdominal wall cellulitis, versus infection of periesophageal fluid collection, versus atelectasis or developing pneumonia -I'm not convinced of pneumonia at this point, hence will hold off on broad-spectrum antibiotics -Started by CCS on IV Zosyn -Monitor clinically, further management his condition evolves -keep NPO for now per CCS and use Gtube for nutrition/meds  2. Recent sinus bradycardia with junctional rhythm: -Was seen by cards last admission and labetalol was discontinued,  -brief bigeminy, replace K, Mag normal on 11/25, monitor on Tele -will monitor off lebatolol  3. Pleural effusions -post operative, likely related to hiatal hernia repair, could also be iatrogenic from fluid reuscitation -ECHO 10/14 with normal EF 55-60% and grade 1 diastolic dysfunction -stop IVF, gentle diuresis with Iv lasix - will hold HCTZ  4. Hypokalemia -replace  [facility] will followup again tomorrow. Please contact me if I can be of assistance in the meanwhile. Thank you for this consultation.  Chief Complaint: Slight fever and poor by mouth intake   HPI: [name] is a 79 year old white female with past medical history of CABG, hypertension, CKD2, was discharged from [facility] yesterday after a laparoscopic hernia repair and G tube placement. Her family reports that she had a good day in the hospital yesterday morning and subsequently went home with her daughters in the evening, hardly eat any dinner, this morning was a little lethargic ate only a  couple of bites for breakfast, she also had a  temp of 100, the air and continued to have intermittent cough and wheezing like she did in the hospital Thursday. Was seen in the ER by surgery, had CT scans of her chest abdomen and pelvis which showed pleural effusions in the paraesophageal fluid collections, she is being admitted by surgery and [facility] was consulted for further evaluation and management.  Review of Systems:  Positive for cough, low grade temp, wheezing, poor Po intake and   Past Medical History  Diagnosis Date  . MI (myocardial infarction)   . Hypertension   . Hyperlipidemia   . Ulcer   . Colon polyps     removed with colonoscopy  . Aortic stenosis   . CAD (coronary artery disease)   . Gastric outlet obstruction     10'14- hospital stay of several days([facility])  . Hiatal hernia   . Dementia 05-05-13    short term memory issues"no dx"  . Hypothyroidism   . Chronic kidney disease 05-05-13    renal dysfunction- has improved from 3'11  . Anemia     past hx. 2011  . Renal artery stenosis     Previous stenting of renal arteries bilaterally   Past Surgical History  Procedure Laterality Date  . Coronary artery bypass graft  2002    Dr Dorris Fetch  . Cholecystectomy      open  . Carotid endarterectomy Left 2011    Dr. Darrick Penna  . Hiatal hernia repair N/A 05/12/2013    Procedure: LAPAROSCOPIC REPAIR OF HIATAL HERNIA, ;  Surgeon: Axel Filler, MD;  Location: [facility];  Service: General;  Laterality: N/A;  . Gastrostomy N/A 05/12/2013    Procedure: G TUBE PLACEMENT;  Surgeon: Axel Filler, MD;  Location: [facility];  Service: General;  Laterality: N/A;  . Insertion of mesh N/A 05/12/2013    Procedure: INSERTION OF MESH;  Surgeon: Axel Filler, MD;  Location: [facility];  Service: General;  Laterality: N/A;   Social History:  reports that she quit smoking about 24 years ago. She has never used smokeless tobacco. She reports that she does not drink alcohol or use illicit drugs.  Allergies  Allergen Reactions  . Codeine  Phosphate Nausea Only   Family History  Problem Relation Age of Onset  . Heart disease Father     CAD    Prior to Admission medications   Medication Sig Start Date End Date Taking? Authorizing Provider  amLODipine (NORVASC) 10 MG tablet Take 10 mg by mouth daily with breakfast.    Yes Historical Provider, MD  aspirin EC 81 MG tablet Take 81 mg by mouth daily.   Yes Historical Provider, MD  Cholecalciferol (VITAMIN D-3) 5000 UNITS TABS Take 5,000 Units by mouth daily.   Yes Historical Provider, MD  ferrous sulfate 325 (65 FE) MG tablet Take 325 mg by mouth 2 (two) times daily.   Yes Historical Provider, MD  hydrALAZINE (APRESOLINE) 25 MG tablet Take 1 tablet (25 mg total) by mouth every 8 (eight) hours. 05/15/13  Yes Velora Heckler, MD  hydrochlorothiazide (HYDRODIURIL) 25 MG tablet Take 25 mg by mouth daily with breakfast.    Yes Historical Provider, MD  HYDROcodone-acetaminophen (HYCET) 7.5-325 mg/15 ml solution Take 15 mLs by mouth 4 (four) times daily as needed for moderate pain. 05/12/13 05/12/14 Yes Axel Filler, MD  labetalol (NORMODYNE) 200 MG tablet Take 200 mg by mouth 2 (two) times daily.   Yes Historical Provider, MD  levothyroxine (SYNTHROID, LEVOTHROID) 150 MCG tablet Take 150 mcg by mouth daily before breakfast.   Yes Historical Provider, MD  naproxen sodium (ANAPROX) 220 MG tablet Take 220 mg by mouth as needed (pain).    Yes Historical Provider, MD  pravastatin (PRAVACHOL) 80 MG tablet Take 80 mg by mouth daily.   Yes Historical Provider, MD  Probiotic Product (PROBIOTIC DAILY PO) Take 1 tablet by mouth daily.   Yes Historical Provider, MD   Physical Exam: Blood pressure 148/66, pulse 94, temperature 98.5 F (36.9 C), temperature source Oral, resp. rate 19, SpO2 97.00%. Filed Vitals:   05/16/13 1555  BP: 148/66  Pulse: 94  Temp: 98.5 F (36.9 C)  Resp: 19     General:   alert awake oriented to self place and partially to time  HEENT: PERRLA, EOMI  CVS S1-S2  regular rate rhythm no murmurs rubs or gallops  Lungs: Diminished at the bases, crackles in both lower lung zones  Abdomen soft, nondistended nontender, mild erythema and swelling around the G-tube site with minimal discharge and foul order, bowel sounds present  Extremities no edema clubbing or cyanosis  Skin no rashes  Neuro nonfocal  Psychiatric appropriate mood and affect     Labs on Admission:  Basic Metabolic Panel:  Recent Labs Lab 05/12/13 0220 05/13/13 0510 05/16/13 1430  NA 139 137 131*  K 3.8 3.7 2.8*  CL 103 104 94*  CO2 25 25 28   GLUCOSE 127* 139* 129*  BUN 21 14 12   CREATININE 0.97 0.86 0.92  CALCIUM 9.8 8.6 9.8  MG  --  2.5  --   PHOS  --  2.5  --    Liver Function Tests:  Recent Labs Lab 05/13/13 0510 05/16/13 1430  AST 32 20  ALT 21 16  ALKPHOS 50  70  BILITOT 0.7 0.9  PROT 5.6* 6.1  ALBUMIN 2.9* 2.6*    Recent Labs Lab 05/16/13 1430  LIPASE 15   No results found for this basename: AMMONIA,  in the last 168 hours CBC:  Recent Labs Lab 05/12/13 0220 05/13/13 0510 05/16/13 1430  WBC 6.7 8.9 10.8*  NEUTROABS 5.1  --  8.9*  HGB 14.9 14.2 14.9  HCT 42.7 41.4 41.5  MCV 88.4 89.0 86.8  PLT 198 164 240   Cardiac Enzymes:  Recent Labs Lab 05/12/13 0220  TROPONINI <0.30   BNP: No components found with this basename: POCBNP,  CBG: No results found for this basename: GLUCAP,  in the last 168 hours  Radiological Exams on Admission: Dg Chest 2 View  05/16/2013   CLINICAL DATA:  Fever and cough.  EXAM: CHEST - 2 VIEW  COMPARISON:  05/14/2013  FINDINGS: Previous CABG. Heart size upper limits normal. Moderate bilateral pleural effusions with consolidation/ atelectasis in the lower lobes, left greater than right. No overt interstitial edema.  IMPRESSION: Persistent pleural effusions and bibasilar consolidation/ atelectasis.   Electronically Signed   By: Oley Balm M.D.   On: 05/16/2013 14:24   Dg Chest 2 View  05/14/2013    CLINICAL DATA:  Wheezing  EXAM: CHEST  2 VIEW  COMPARISON:  Prior radiograph from 05/12/2013  FINDINGS: Median sternotomy wires with underlying CABG markers are again noted, unchanged. Mild cardiomegaly is stable.  The lungs are hypoinflated. There are increased bibasilar airspace opacities, which may reflect atelectasis and/ or bronchovascular crowding with hyperinflation. Dense opacity within the retrocardiac left lower lobe may reflect atelectasis or infiltrate. No definite pleural effusion. No pulmonary edema. No pneumothorax.  Osseous structures are unchanged.  IMPRESSION: Hypoinflation with increased bibasilar airspace opacities, left greater than right. While these findings may reflect atelectasis and/ or bronchovascular crowding related to hypoinflation, possible infiltrates could also be considered, particularly at the left lung base.   Electronically Signed   By: Rise Mu M.D.   On: 05/14/2013 21:59   Ct Chest Wo Contrast  05/16/2013   CLINICAL DATA:  79 year old female with fever and weakness. Status post laparoscopic hiatal hernia repair 4 days ago.  EXAM: CT CHEST WITHOUT CONTRAST  TECHNIQUE: Multidetector CT imaging of the chest was performed following the standard protocol without IV contrast. Oral contrast was administered immediately before the study.  COMPARISON:  05/16/2013 abdominal CT  FINDINGS: Contrast within the entire esophagus to the stomach is noted without evidence of esophageal leak.  Postoperative changes from hiatal hernia repair identified. Focal collections are noted in the distal paraesophageal region, measuring 5.3 x 8.6 cm on the right and 2.4 x 3.9 cm on the left. These collections narrow the distal esophagus and may represent postoperative collections such as seroma, lymphocele or evolving hematoma, but abscess is not excluded.  Small to moderate bilateral pleural effusions are present with moderate bibasilar atelectasis.  Cardiomegaly with evidence of previous  cardiac surgery noted.  There is no evidence of pericardial effusion or enlarged lymph nodes.  A tiny amount of gas within the anterior mediastinum and upper left chest wall may be related to recent surgery.  No acute or suspicious bony abnormalities are identified.  IMPRESSION: No evidence of esophageal leak.  Distal paraesophageal collections, 5.3 x 8.6 cm on the right and 2.4 x 3.9 cm on the left. Although these may represent postoperative collections, abscess is not excluded - correlate clinically and consider sampling.  Small to moderate bilateral pleural effusions  with moderate bibasilar atelectasis.  Small foci of gas within the anterior mediastinum and upper left chest - likely related to recent surgery.   Electronically Signed   By: Laveda Abbe M.D.   On: 05/16/2013 18:01   Ct Abdomen Pelvis W Contrast  05/16/2013   CLINICAL DATA:  Decreased appetite. Recent hernia repair. Postop day number 4.  EXAM: CT ABDOMEN AND PELVIS WITH CONTRAST  TECHNIQUE: Multidetector CT imaging of the abdomen and pelvis was performed using the standard protocol following bolus administration of intravenous contrast.  CONTRAST:  50mL OMNIPAQUE IOHEXOL 300 MG/ML SOLN, OMNIPAQUE IOHEXOL 300 MG/ML SOLN  COMPARISON:  04/20/2013  FINDINGS: The large hiatal hernia has been repaired. The stomach is now all within the abdomen, below the diaphragm. The distal esophagus is abnormal. There is wall thickening and low density within the esophageal wall. There is a complex fluid collection to the right of the distal esophagus measuring 9.6 x 6.4 cm in the lower medial right hemi thorax. There are hyperdense areas layering inferiorly. The esophageal wall is inseparable from the fluid collection. There is a focal blush of high-density on image number 1 of this study adjacent to the esophagus. These findings raise the possibility of esophageal leak.  Small bilateral pleural effusions are associated with bibasilar atelectasis versus airspace  disease. No pneumothorax. Stable appearance of the heart.  The gallbladder is absent. There is associated dilatation of the common bile duct and biliary tree, especially the left lobe. There are several benign appearing cysts within the left lobe and to a lesser degree the right lobe of the liver.  Spleen, adrenal glands are within normal limits. Chronic changes of the kidneys bilaterally. Tiny hypodensities in the lower pole of left kidney are too small to characterize. Simple cyst in the right kidney. Calculus in the lower pole of the right kidney.  The pancreas is atrophic.  No obvious pancreatic head mass.  A gastrostomy tube is in place. There is gas within the abdominal wall compatible with recent intervention. The bulb of the gastrostomy tube is appropriately positioned within the lumen of the stomach. There is a small amount of free intraperitoneal gas adjacent to the gastrostomy site.  There is gas in the subcutaneous fat bilaterally, more so on the right of unknown significance.  Left inguinal hernia is again noted and now contains a small amount of free fluid.  Atherosclerotic changes in the abdomen are stable. Juxtarenal abdominal aortic aneurysm measures 3.0 cm. This is state  Bladder is unremarkable.  Images of the uterus demonstrate finding suspicious for endometrial thickening diverticulosis of the sigmoid colon is present without evidence of acute diverticulitis. Mild distention of the colon.  Degenerative changes of the lumbar spine. No vertebral compression deformity. Mild dextroscoliosis of the lumbar spine.   Electronically Signed   By: Maryclare Bean M.D.   On: 05/16/2013 16:08    EKG: Independently reviewed.  normal sinus rhythm no acute ST-T wave changes mildly prolonged PR interval   Time spent:  [facility] Pager [phone]  If 7PM-7AM, please contact night-coverage [email or web address] Password [facility] 05/16/2013, 6:37 PM

## 2013-05-16 NOTE — ED Notes (Signed)
Assumed care at this time. Pt is in CT scan.

## 2013-05-17 DIAGNOSIS — L0291 Cutaneous abscess, unspecified: Secondary | ICD-10-CM

## 2013-05-17 DIAGNOSIS — E039 Hypothyroidism, unspecified: Secondary | ICD-10-CM

## 2013-05-17 DIAGNOSIS — I10 Essential (primary) hypertension: Secondary | ICD-10-CM

## 2013-05-17 LAB — CBC
HCT: 39.4 % (ref 36.0–46.0)
Hemoglobin: 13.7 g/dL (ref 12.0–15.0)
MCH: 30.3 pg (ref 26.0–34.0)
MCHC: 34.8 g/dL (ref 30.0–36.0)
Platelets: 222 10*3/uL (ref 150–400)
RDW: 13.8 % (ref 11.5–15.5)
WBC: 8.4 10*3/uL (ref 4.0–10.5)

## 2013-05-17 LAB — COMPREHENSIVE METABOLIC PANEL
Albumin: 2.4 g/dL — ABNORMAL LOW (ref 3.5–5.2)
Alkaline Phosphatase: 67 U/L (ref 39–117)
BUN: 10 mg/dL (ref 6–23)
CO2: 31 mEq/L (ref 19–32)
Calcium: 9 mg/dL (ref 8.4–10.5)
Glucose, Bld: 104 mg/dL — ABNORMAL HIGH (ref 70–99)
Potassium: 3.3 mEq/L — ABNORMAL LOW (ref 3.5–5.1)
Total Protein: 5.6 g/dL — ABNORMAL LOW (ref 6.0–8.3)

## 2013-05-17 LAB — URINE CULTURE: Colony Count: 55000

## 2013-05-17 MED ORDER — POTASSIUM CHLORIDE 20 MEQ/15ML (10%) PO LIQD
40.0000 meq | Freq: Once | ORAL | Status: AC
  Administered 2013-05-17: 40 meq
  Filled 2013-05-17: qty 30

## 2013-05-17 MED ORDER — ENSURE PUDDING PO PUDG
1.0000 | Freq: Three times a day (TID) | ORAL | Status: DC
  Administered 2013-05-17 – 2013-05-21 (×8): 1 via ORAL
  Filled 2013-05-17 (×15): qty 1

## 2013-05-17 NOTE — Progress Notes (Signed)
Subjective: No pain. Breathing more comfortable. No fevers or chills  Objective: Vital signs in last 24 hours: Temp:  [98.2 F (36.8 C)-98.8 F (37.1 C)] 98.3 F (36.8 C) (11/30 0525) Pulse Rate:  [93-97] 97 (11/30 0525) Resp:  [18-20] 18 (11/30 0525) BP: (128-166)/(62-82) 149/69 mmHg (11/30 0525) SpO2:  [91 %-98 %] 98 % (11/30 0525) Weight:  [141 lb 3.2 oz (64.048 kg)-142 lb 10.2 oz (64.7 kg)] 141 lb 3.2 oz (64.048 kg) (11/30 0525) Last BM Date: 05/15/13  Intake/Output from previous day: 11/29 0701 - 11/30 0700 In: -  Out: 1125 [Urine:1125] Intake/Output this shift:    General appearance: alert, cooperative and no distress Resp: nonlabored. sats 98% on 2 l Cardio: normal rate, regular GI: soft, NT, ND, wounds okay,  gastrostomy tube still with odor, erythema no worse and not much change, no crepitus, nontender in the area of concern  Lab Results:   Recent Labs  05/16/13 1430 05/17/13 0411  WBC 10.8* 8.4  HGB 14.9 13.7  HCT 41.5 39.4  PLT 240 222   BMET  Recent Labs  05/16/13 1430 05/17/13 0411  NA 131* 137  K 2.8* 3.3*  CL 94* 96  CO2 28 31  GLUCOSE 129* 104*  BUN 12 10  CREATININE 0.92 0.90  CALCIUM 9.8 9.0   PT/INR No results found for this basename: LABPROT, INR,  in the last 72 hours ABG No results found for this basename: PHART, PCO2, PO2, HCO3,  in the last 72 hours  Studies/Results: Dg Chest 2 View  05/16/2013   CLINICAL DATA:  Fever and cough.  EXAM: CHEST - 2 VIEW  COMPARISON:  05/14/2013  FINDINGS: Previous CABG. Heart size upper limits normal. Moderate bilateral pleural effusions with consolidation/ atelectasis in the lower lobes, left greater than right. No overt interstitial edema.  IMPRESSION: Persistent pleural effusions and bibasilar consolidation/ atelectasis.   Electronically Signed   By: Oley Balm M.D.   On: 05/16/2013 14:24   Ct Chest Wo Contrast  05/16/2013   CLINICAL DATA:  79 year old female with fever and weakness.  Status post laparoscopic hiatal hernia repair 4 days ago.  EXAM: CT CHEST WITHOUT CONTRAST  TECHNIQUE: Multidetector CT imaging of the chest was performed following the standard protocol without IV contrast. Oral contrast was administered immediately before the study.  COMPARISON:  05/16/2013 abdominal CT  FINDINGS: Contrast within the entire esophagus to the stomach is noted without evidence of esophageal leak.  Postoperative changes from hiatal hernia repair identified. Focal collections are noted in the distal paraesophageal region, measuring 5.3 x 8.6 cm on the right and 2.4 x 3.9 cm on the left. These collections narrow the distal esophagus and may represent postoperative collections such as seroma, lymphocele or evolving hematoma, but abscess is not excluded.  Small to moderate bilateral pleural effusions are present with moderate bibasilar atelectasis.  Cardiomegaly with evidence of previous cardiac surgery noted.  There is no evidence of pericardial effusion or enlarged lymph nodes.  A tiny amount of gas within the anterior mediastinum and upper left chest wall may be related to recent surgery.  No acute or suspicious bony abnormalities are identified.  IMPRESSION: No evidence of esophageal leak.  Distal paraesophageal collections, 5.3 x 8.6 cm on the right and 2.4 x 3.9 cm on the left. Although these may represent postoperative collections, abscess is not excluded - correlate clinically and consider sampling.  Small to moderate bilateral pleural effusions with moderate bibasilar atelectasis.  Small foci of gas within the  anterior mediastinum and upper left chest - likely related to recent surgery.   Electronically Signed   By: Laveda Abbe M.D.   On: 05/16/2013 18:01   Ct Abdomen Pelvis W Contrast  05/16/2013   CLINICAL DATA:  Decreased appetite. Recent hernia repair. Postop day number 4.  EXAM: CT ABDOMEN AND PELVIS WITH CONTRAST  TECHNIQUE: Multidetector CT imaging of the abdomen and pelvis was performed  using the standard protocol following bolus administration of intravenous contrast.  CONTRAST:  50mL OMNIPAQUE IOHEXOL 300 MG/ML SOLN, OMNIPAQUE IOHEXOL 300 MG/ML SOLN  COMPARISON:  04/20/2013  FINDINGS: The large hiatal hernia has been repaired. The stomach is now all within the abdomen, below the diaphragm. The distal esophagus is abnormal. There is wall thickening and low density within the esophageal wall. There is a complex fluid collection to the right of the distal esophagus measuring 9.6 x 6.4 cm in the lower medial right hemi thorax. There are hyperdense areas layering inferiorly. The esophageal wall is inseparable from the fluid collection. There is a focal blush of high-density on image number 1 of this study adjacent to the esophagus. These findings raise the possibility of esophageal leak.  Small bilateral pleural effusions are associated with bibasilar atelectasis versus airspace disease. No pneumothorax. Stable appearance of the heart.  The gallbladder is absent. There is associated dilatation of the common bile duct and biliary tree, especially the left lobe. There are several benign appearing cysts within the left lobe and to a lesser degree the right lobe of the liver.  Spleen, adrenal glands are within normal limits. Chronic changes of the kidneys bilaterally. Tiny hypodensities in the lower pole of left kidney are too small to characterize. Simple cyst in the right kidney. Calculus in the lower pole of the right kidney.  The pancreas is atrophic.  No obvious pancreatic head mass.  A gastrostomy tube is in place. There is gas within the abdominal wall compatible with recent intervention. The bulb of the gastrostomy tube is appropriately positioned within the lumen of the stomach. There is a small amount of free intraperitoneal gas adjacent to the gastrostomy site.  There is gas in the subcutaneous fat bilaterally, more so on the right of unknown significance.  Left inguinal hernia is again  noted and now contains a small amount of free fluid.  Atherosclerotic changes in the abdomen are stable. Juxtarenal abdominal aortic aneurysm measures 3.0 cm. This is state  Bladder is unremarkable.  Images of the uterus demonstrate finding suspicious for endometrial thickening diverticulosis of the sigmoid colon is present without evidence of acute diverticulitis. Mild distention of the colon.  Degenerative changes of the lumbar spine. No vertebral compression deformity. Mild dextroscoliosis of the lumbar spine.   Electronically Signed   By: Maryclare Bean M.D.   On: 05/16/2013 16:08    Anti-infectives: Anti-infectives   Start     Dose/Rate Route Frequency Ordered Stop   05/16/13 2200  piperacillin-tazobactam (ZOSYN) IVPB 3.375 g     3.375 g 12.5 mL/hr over 240 Minutes Intravenous 3 times per day 05/16/13 1930        Assessment/Plan: s/p * No surgery found * since CT chest negative for leak, will try to feed her.  She says that she is interested in eating.  She looks much better and more animated today.  She is breathing comfortably and wheezing better.  The area of concern around her gtube is pretty much unchanged.  Certainly no worse than prior.  I explained  that I am still a little concerned about possible abscess in the area lateral to her tube but she is not tender and there was no evidence of drainable fluid collection on CT.  Will keep her on abx for this but if erythema does not improve or tenderness or fluctuance develops, will consider I/D.  Will make her NPO tonight in case she needs I/D.  LOS: 1 day    Wanda Robinson 05/17/2013

## 2013-05-17 NOTE — Progress Notes (Signed)
Performed education with family on how to give medications and flushes in G tube. Family demonstrated understanding with return demonstration. Educated family on dressing changes for G tube site. [place]

## 2013-05-17 NOTE — Progress Notes (Signed)
[facility] PROGRESS NOTE/CONSULT  Wanda Robinson WUJ:[id number] DOB: Nov 08, 1928 DOA: 05/16/2013 PCP: Dois Davenport., MD  Assessment/Plan: 1.Low grade fever-Postop day #4, status post laparoscopic hiatal hernia repair with Gastrostomy tube placement  -Gastrostomy site infection/evolving abdominal wall cellulitis vs paraesophageal fluid collection infection  versus infection of versus atelectasis or developing pneumonia  -imaging stuidies not very convincing for pneumonia at this point, and denies cough will continue will continue to hold off broadening abx for a PNA at this time -Started by [facility] on IV Zosyn  -continue to Monitor closely and further manage as clinically appropriate   2. Recent sinus bradycardia with junctional rhythm:  -Was seen by cards last admission and labetalol was discontinued,  -replace K again, Mag normal on 11/25,  -continue monitoring on Tele  -pt in NSR  off lebatolol, follow  3. Pleural effusions  -post operative, likely related to hiatal hernia repair, could also be iatrogenic from fluid reuscitation  -ECHO 10/14 with normal EF 55-60% and grade 1 diastolic dysfunction  -stop IVF, continue gentle diuresis with iv lasix for now and follow - holding HCTZ for now,   4. Hypokalemia  -replace 5. HTN -Continue Norvasc   Code Status: full Family Communication: 2 daughters at bedside Disposition Plan: per primary team     Procedures:  none  Antibiotics:  Zosyn started on 11/29  HPI/Subjective: States she feels better this am, denies cough and no SOB. Denies pain, and no n/v. still drainage from PEG site.   Objective: Filed Vitals:   05/17/13 0525  BP: 149/69  Pulse: 97  Temp: 98.3 F (36.8 C)  Resp: 18    Intake/Output Summary (Last 24 hours) at 05/17/13 1042 Last data filed at 05/17/13 0530  Gross per 24 hour  Intake      0 ml  Output   1125 ml  Net  -1125 ml   Filed Weights   05/16/13 2100 05/17/13 0525  Weight: 64.7 kg (142  lb 10.2 oz) 64.048 kg (141 lb 3.2 oz)    Exam:  General: alert & oriented x 3 In NAD Cardiovascular: RRR, nl S1 s2 Respiratory: CTAB Abdomen: soft +BS NT/ND, no masses palpable, L. Side of abd with mild erythema and PEG site with malodorous grayish drainage Extremities: No cyanosis and no edema   Data Reviewed: Basic Metabolic Panel:  Recent Labs Lab 05/12/13 0220 05/13/13 0510 05/16/13 1430 05/17/13 0411  NA 139 137 131* 137  K 3.8 3.7 2.8* 3.3*  CL 103 104 94* 96  CO2 25 25 28 31   GLUCOSE 127* 139* 129* 104*  BUN 21 14 12 10   CREATININE 0.97 0.86 0.92 0.90  CALCIUM 9.8 8.6 9.8 9.0  MG  --  2.5  --   --   PHOS  --  2.5  --   --    Liver Function Tests:  Recent Labs Lab 05/13/13 0510 05/16/13 1430 05/17/13 0411  AST 32 20 21  ALT 21 16 15   ALKPHOS 50 70 67  BILITOT 0.7 0.9 0.9  PROT 5.6* 6.1 5.6*  ALBUMIN 2.9* 2.6* 2.4*    Recent Labs Lab 05/16/13 1430  LIPASE 15   No results found for this basename: AMMONIA,  in the last 168 hours CBC:  Recent Labs Lab 05/12/13 0220 05/13/13 0510 05/16/13 1430 05/17/13 0411  WBC 6.7 8.9 10.8* 8.4  NEUTROABS 5.1  --  8.9*  --   HGB 14.9 14.2 14.9 13.7  HCT 42.7 41.4 41.5 39.4  MCV 88.4 89.0  86.8 87.2  PLT 198 164 240 222   Cardiac Enzymes:  Recent Labs Lab 05/12/13 0220  TROPONINI <0.30   BNP (last 3 results) No results found for this basename: PROBNP,  in the last 8760 hours CBG: No results found for this basename: GLUCAP,  in the last 168 hours  Recent Results (from the past 240 hour(s))  CULTURE, BLOOD (ROUTINE X 2)     Status: None   Collection Time    05/16/13  2:25 PM      Result Value Range Status   Specimen Description BLOOD RIGHT ARM  3 ML IN [facility] BOTTLE   Final   Special Requests NONE   Final   Culture  Setup Time     Final   Value: 05/16/2013 17:23     Performed at [facility]   Culture     Final   Value:        BLOOD CULTURE RECEIVED NO GROWTH TO DATE CULTURE WILL BE HELD  FOR 5 DAYS BEFORE ISSUING A FINAL NEGATIVE REPORT     Performed at [facility]   Report Status PENDING   Incomplete  CULTURE, BLOOD (ROUTINE X 2)     Status: None   Collection Time    05/16/13  2:30 PM      Result Value Range Status   Specimen Description BLOOD RIGHT FOREARM  4 ML IN AEROBIC ONLY   Final   Special Requests NONE   Final   Culture  Setup Time     Final   Value: 05/16/2013 17:23     Performed at [facility]   Culture     Final   Value:        BLOOD CULTURE RECEIVED NO GROWTH TO DATE CULTURE WILL BE HELD FOR 5 DAYS BEFORE ISSUING A FINAL NEGATIVE REPORT     Performed at [facility]   Report Status PENDING   Incomplete     Studies: Dg Chest 2 View  05/16/2013   CLINICAL DATA:  Fever and cough.  EXAM: CHEST - 2 VIEW  COMPARISON:  05/14/2013  FINDINGS: Previous CABG. Heart size upper limits normal. Moderate bilateral pleural effusions with consolidation/ atelectasis in the lower lobes, left greater than right. No overt interstitial edema.  IMPRESSION: Persistent pleural effusions and bibasilar consolidation/ atelectasis.   Electronically Signed   By: Oley Balm M.D.   On: 05/16/2013 14:24   Ct Chest Wo Contrast  05/16/2013   CLINICAL DATA:  79 year old female with fever and weakness. Status post laparoscopic hiatal hernia repair 4 days ago.  EXAM: CT CHEST WITHOUT CONTRAST  TECHNIQUE: Multidetector CT imaging of the chest was performed following the standard protocol without IV contrast. Oral contrast was administered immediately before the study.  COMPARISON:  05/16/2013 abdominal CT  FINDINGS: Contrast within the entire esophagus to the stomach is noted without evidence of esophageal leak.  Postoperative changes from hiatal hernia repair identified. Focal collections are noted in the distal paraesophageal region, measuring 5.3 x 8.6 cm on the right and 2.4 x 3.9 cm on the left. These collections narrow the distal esophagus and may represent  postoperative collections such as seroma, lymphocele or evolving hematoma, but abscess is not excluded.  Small to moderate bilateral pleural effusions are present with moderate bibasilar atelectasis.  Cardiomegaly with evidence of previous cardiac surgery noted.  There is no evidence of pericardial effusion or enlarged lymph nodes.  A tiny amount of gas within the anterior mediastinum  and upper left chest wall may be related to recent surgery.  No acute or suspicious bony abnormalities are identified.  IMPRESSION: No evidence of esophageal leak.  Distal paraesophageal collections, 5.3 x 8.6 cm on the right and 2.4 x 3.9 cm on the left. Although these may represent postoperative collections, abscess is not excluded - correlate clinically and consider sampling.  Small to moderate bilateral pleural effusions with moderate bibasilar atelectasis.  Small foci of gas within the anterior mediastinum and upper left chest - likely related to recent surgery.   Electronically Signed   By: Laveda Abbe M.D.   On: 05/16/2013 18:01   Ct Abdomen Pelvis W Contrast  05/16/2013   CLINICAL DATA:  Decreased appetite. Recent hernia repair. Postop day number 4.  EXAM: CT ABDOMEN AND PELVIS WITH CONTRAST  TECHNIQUE: Multidetector CT imaging of the abdomen and pelvis was performed using the standard protocol following bolus administration of intravenous contrast.  CONTRAST:  50mL OMNIPAQUE IOHEXOL 300 MG/ML SOLN, OMNIPAQUE IOHEXOL 300 MG/ML SOLN  COMPARISON:  04/20/2013  FINDINGS: The large hiatal hernia has been repaired. The stomach is now all within the abdomen, below the diaphragm. The distal esophagus is abnormal. There is wall thickening and low density within the esophageal wall. There is a complex fluid collection to the right of the distal esophagus measuring 9.6 x 6.4 cm in the lower medial right hemi thorax. There are hyperdense areas layering inferiorly. The esophageal wall is inseparable from the fluid collection. There  is a focal blush of high-density on image number 1 of this study adjacent to the esophagus. These findings raise the possibility of esophageal leak.  Small bilateral pleural effusions are associated with bibasilar atelectasis versus airspace disease. No pneumothorax. Stable appearance of the heart.  The gallbladder is absent. There is associated dilatation of the common bile duct and biliary tree, especially the left lobe. There are several benign appearing cysts within the left lobe and to a lesser degree the right lobe of the liver.  Spleen, adrenal glands are within normal limits. Chronic changes of the kidneys bilaterally. Tiny hypodensities in the lower pole of left kidney are too small to characterize. Simple cyst in the right kidney. Calculus in the lower pole of the right kidney.  The pancreas is atrophic.  No obvious pancreatic head mass.  A gastrostomy tube is in place. There is gas within the abdominal wall compatible with recent intervention. The bulb of the gastrostomy tube is appropriately positioned within the lumen of the stomach. There is a small amount of free intraperitoneal gas adjacent to the gastrostomy site.  There is gas in the subcutaneous fat bilaterally, more so on the right of unknown significance.  Left inguinal hernia is again noted and now contains a small amount of free fluid.  Atherosclerotic changes in the abdomen are stable. Juxtarenal abdominal aortic aneurysm measures 3.0 cm. This is state  Bladder is unremarkable.  Images of the uterus demonstrate finding suspicious for endometrial thickening diverticulosis of the sigmoid colon is present without evidence of acute diverticulitis. Mild distention of the colon.  Degenerative changes of the lumbar spine. No vertebral compression deformity. Mild dextroscoliosis of the lumbar spine.   Electronically Signed   By: Maryclare Bean M.D.   On: 05/16/2013 16:08    Scheduled Meds: . amLODipine  10 mg Per Tube Q breakfast  . enoxaparin  (LOVENOX) injection  40 mg Subcutaneous Q24H  . feeding supplement (ENSURE)  1 Container Oral TID BM  .  furosemide  40 mg Intravenous Q12H  . hydrALAZINE  25 mg Per Tube Q8H  . levothyroxine  150 mcg Per Tube QAC breakfast  . piperacillin-tazobactam (ZOSYN)  IV  3.375 g Intravenous Q8H  . potassium chloride  20 mEq Per Tube BID  . potassium chloride  40 mEq Per Tube Once   Continuous Infusions: . dextrose 5 % and 0.9 % NaCl with KCl 20 mEq/L 10 mL/hr (05/16/13 2129)    Active Problems:   Cellulitis   Fever    Time spent:    [facility]  [facility] Pager [phone]. If 7PM-7AM, please contact night-coverage at [email or web address], password [facility] 05/17/2013, 10:42 AM  LOS: 1 day

## 2013-05-17 NOTE — Progress Notes (Signed)
Patient getting restless and irritated with daughter. Pulling off leads and pulling at Gastrostomy site. Called and notified Dr. Gerrit Friends. Received order for abdominal binder will place as soon as it arrives. Erskin Burnet RN

## 2013-05-17 NOTE — Progress Notes (Signed)
Patient voiced concerns about MOST form. She said that she does not think that she needs it at this point in time and does not want to fill it out. Erskin Burnet RN

## 2013-05-18 DIAGNOSIS — E876 Hypokalemia: Secondary | ICD-10-CM

## 2013-05-18 LAB — BASIC METABOLIC PANEL
BUN: 11 mg/dL (ref 6–23)
Calcium: 9.3 mg/dL (ref 8.4–10.5)
Creatinine, Ser: 1.08 mg/dL (ref 0.50–1.10)
GFR calc Af Amer: 54 mL/min — ABNORMAL LOW (ref >90)
GFR calc non Af Amer: 46 mL/min — ABNORMAL LOW (ref >90)
Glucose, Bld: 127 mg/dL — ABNORMAL HIGH (ref 70–99)
Sodium: 137 mEq/L (ref 135–145)

## 2013-05-18 MED ORDER — FUROSEMIDE 10 MG/ML IJ SOLN
40.0000 mg | Freq: Two times a day (BID) | INTRAMUSCULAR | Status: AC
  Administered 2013-05-18: 21:00:00 40 mg via INTRAVENOUS

## 2013-05-18 MED ORDER — HYDROCHLOROTHIAZIDE 25 MG PO TABS
25.0000 mg | ORAL_TABLET | Freq: Every day | ORAL | Status: DC
  Administered 2013-05-19 – 2013-05-21 (×3): 25 mg via ORAL
  Filled 2013-05-18 (×3): qty 1

## 2013-05-18 MED ORDER — POTASSIUM CHLORIDE 20 MEQ/15ML (10%) PO LIQD
20.0000 meq | Freq: Once | ORAL | Status: AC
  Administered 2013-05-18: 13:00:00 20 meq
  Filled 2013-05-18: qty 15

## 2013-05-18 NOTE — Progress Notes (Signed)
[facility] PROGRESS NOTE/CONSULT  Wanda Robinson ZOX:[id number] DOB: 27-Nov-1928 DOA: 05/16/2013 PCP: Dois Davenport., MD  Assessment/Plan: 1.Low grade fever-Postop day #4, status post laparoscopic hiatal hernia repair with Gastrostomy tube placement  -Gastrostomy site infection/evolving abdominal wall cellulitis vs paraesophageal fluid collection infection  versus infection of versus atelectasis or developing pneumonia  -imaging stuidies not very convincing for pneumonia, and denies cough will continue will continue to hold off broadening abx for a PNA. -contiue IV Zosyn, clinically improving remains afebrile and hemodyamically stable  -continue to Monitor closely and further manage as clinically appropriate   2. Recent sinus bradycardia with junctional rhythm:  -Was seen by cards last admission and labetalol was discontinued,  -replace K again, Mag normal on 11/25,  -continue monitoring on Tele  -pt in NSR  off lebatolol, follow  3. Pleural effusions  -post operative, likely related to hiatal hernia repair, could also be iatrogenic from fluid reuscitation  -ECHO 10/14 with normal EF 55-60% and grade 1 diastolic dysfunction  -stop IVF, continue gentle diuresis with iv lasix for today, plan to chage back to HCTZ in and d/c iv lasix then - holding HCTZ for now,   4. Hypokalemia -d/t diuretics  -again replace 5. HTN -Continue Norvasc   Code Status: full Family Communication: 2 daughters at bedside Disposition Plan: per primary team     Procedures:  none  Antibiotics:  Zosyn started on 11/29  HPI/Subjective: States she feels better this am, denies cough and no SOB.  Objective: Filed Vitals:   05/18/13 0839  BP: 130/63  Pulse:   Temp:   Resp:     Intake/Output Summary (Last 24 hours) at 05/18/13 1049 Last data filed at 05/18/13 0500  Gross per 24 hour  Intake 885.17 ml  Output   2550 ml  Net -1664.83 ml   Filed Weights   05/16/13 2100 05/17/13 0525   Weight: 64.7 kg (142 lb 10.2 oz) 64.048 kg (141 lb 3.2 oz)    Exam:  General: alert & oriented x 3 In NAD Cardiovascular: RRR, nl S1 s2 Respiratory: CTAB Abdomen: soft +BS NT/ND, no masses palpable, L. Side of abd with mild erythema and PEG site with dressing clean and dry. Extremities: No cyanosis and no edema   Data Reviewed: Basic Metabolic Panel:  Recent Labs Lab 05/12/13 0220 05/13/13 0510 05/16/13 1430 05/17/13 0411 05/18/13 0407  NA 139 137 131* 137 137  K 3.8 3.7 2.8* 3.3* 3.4*  CL 103 104 94* 96 93*  CO2 25 25 28 31  32  GLUCOSE 127* 139* 129* 104* 127*  BUN 21 14 12 10 11   CREATININE 0.97 0.86 0.92 0.90 1.08  CALCIUM 9.8 8.6 9.8 9.0 9.3  MG  --  2.5  --   --   --   PHOS  --  2.5  --   --   --    Liver Function Tests:  Recent Labs Lab 05/13/13 0510 05/16/13 1430 05/17/13 0411  AST 32 20 21  ALT 21 16 15   ALKPHOS 50 70 67  BILITOT 0.7 0.9 0.9  PROT 5.6* 6.1 5.6*  ALBUMIN 2.9* 2.6* 2.4*    Recent Labs Lab 05/16/13 1430  LIPASE 15   No results found for this basename: AMMONIA,  in the last 168 hours CBC:  Recent Labs Lab 05/12/13 0220 05/13/13 0510 05/16/13 1430 05/17/13 0411  WBC 6.7 8.9 10.8* 8.4  NEUTROABS 5.1  --  8.9*  --   HGB 14.9 14.2 14.9 13.7  HCT  42.7 41.4 41.5 39.4  MCV 88.4 89.0 86.8 87.2  PLT 198 164 240 222   Cardiac Enzymes:  Recent Labs Lab 05/12/13 0220  TROPONINI <0.30   BNP (last 3 results) No results found for this basename: PROBNP,  in the last 8760 hours CBG: No results found for this basename: GLUCAP,  in the last 168 hours  Recent Results (from the past 240 hour(s))  CULTURE, BLOOD (ROUTINE X 2)     Status: None   Collection Time    05/16/13  2:25 PM      Result Value Range Status   Specimen Description BLOOD RIGHT ARM  3 ML IN [facility] BOTTLE   Final   Special Requests NONE   Final   Culture  Setup Time     Final   Value: 05/16/2013 17:23     Performed at [facility]   Culture     Final    Value:        BLOOD CULTURE RECEIVED NO GROWTH TO DATE CULTURE WILL BE HELD FOR 5 DAYS BEFORE ISSUING A FINAL NEGATIVE REPORT     Performed at [facility]   Report Status PENDING   Incomplete  CULTURE, BLOOD (ROUTINE X 2)     Status: None   Collection Time    05/16/13  2:30 PM      Result Value Range Status   Specimen Description BLOOD RIGHT FOREARM  4 ML IN AEROBIC ONLY   Final   Special Requests NONE   Final   Culture  Setup Time     Final   Value: 05/16/2013 17:23     Performed at [facility]   Culture     Final   Value:        BLOOD CULTURE RECEIVED NO GROWTH TO DATE CULTURE WILL BE HELD FOR 5 DAYS BEFORE ISSUING A FINAL NEGATIVE REPORT     Performed at [facility]   Report Status PENDING   Incomplete  URINE CULTURE     Status: None   Collection Time    05/16/13  4:23 PM      Result Value Range Status   Specimen Description URINE, RANDOM   Final   Special Requests NONE   Final   Culture  Setup Time     Final   Value: 05/16/2013 20:20     Performed at [facility] Count     Final   Value: 55,000 COLONIES/ML     Performed at [facility]   Culture     Final   Value: Multiple bacterial morphotypes present, none predominant. Suggest appropriate recollection if clinically indicated.     Performed at [facility]   Report Status 05/17/2013 FINAL   Final     Studies: Dg Chest 2 View  05/16/2013   CLINICAL DATA:  Fever and cough.  EXAM: CHEST - 2 VIEW  COMPARISON:  05/14/2013  FINDINGS: Previous CABG. Heart size upper limits normal. Moderate bilateral pleural effusions with consolidation/ atelectasis in the lower lobes, left greater than right. No overt interstitial edema.  IMPRESSION: Persistent pleural effusions and bibasilar consolidation/ atelectasis.   Electronically Signed   By: Oley Balm M.D.   On: 05/16/2013 14:24   Ct Chest Wo Contrast  05/16/2013   CLINICAL DATA:  79 year old female with fever and  weakness. Status post laparoscopic hiatal hernia repair 4 days ago.  EXAM: CT CHEST WITHOUT CONTRAST  TECHNIQUE: Multidetector CT imaging  of the chest was performed following the standard protocol without IV contrast. Oral contrast was administered immediately before the study.  COMPARISON:  05/16/2013 abdominal CT  FINDINGS: Contrast within the entire esophagus to the stomach is noted without evidence of esophageal leak.  Postoperative changes from hiatal hernia repair identified. Focal collections are noted in the distal paraesophageal region, measuring 5.3 x 8.6 cm on the right and 2.4 x 3.9 cm on the left. These collections narrow the distal esophagus and may represent postoperative collections such as seroma, lymphocele or evolving hematoma, but abscess is not excluded.  Small to moderate bilateral pleural effusions are present with moderate bibasilar atelectasis.  Cardiomegaly with evidence of previous cardiac surgery noted.  There is no evidence of pericardial effusion or enlarged lymph nodes.  A tiny amount of gas within the anterior mediastinum and upper left chest wall may be related to recent surgery.  No acute or suspicious bony abnormalities are identified.  IMPRESSION: No evidence of esophageal leak.  Distal paraesophageal collections, 5.3 x 8.6 cm on the right and 2.4 x 3.9 cm on the left. Although these may represent postoperative collections, abscess is not excluded - correlate clinically and consider sampling.  Small to moderate bilateral pleural effusions with moderate bibasilar atelectasis.  Small foci of gas within the anterior mediastinum and upper left chest - likely related to recent surgery.   Electronically Signed   By: Laveda Abbe M.D.   On: 05/16/2013 18:01   Ct Abdomen Pelvis W Contrast  05/16/2013   CLINICAL DATA:  Decreased appetite. Recent hernia repair. Postop day number 4.  EXAM: CT ABDOMEN AND PELVIS WITH CONTRAST  TECHNIQUE: Multidetector CT imaging of the abdomen and pelvis was  performed using the standard protocol following bolus administration of intravenous contrast.  CONTRAST:  50mL OMNIPAQUE IOHEXOL 300 MG/ML SOLN, OMNIPAQUE IOHEXOL 300 MG/ML SOLN  COMPARISON:  04/20/2013  FINDINGS: The large hiatal hernia has been repaired. The stomach is now all within the abdomen, below the diaphragm. The distal esophagus is abnormal. There is wall thickening and low density within the esophageal wall. There is a complex fluid collection to the right of the distal esophagus measuring 9.6 x 6.4 cm in the lower medial right hemi thorax. There are hyperdense areas layering inferiorly. The esophageal wall is inseparable from the fluid collection. There is a focal blush of high-density on image number 1 of this study adjacent to the esophagus. These findings raise the possibility of esophageal leak.  Small bilateral pleural effusions are associated with bibasilar atelectasis versus airspace disease. No pneumothorax. Stable appearance of the heart.  The gallbladder is absent. There is associated dilatation of the common bile duct and biliary tree, especially the left lobe. There are several benign appearing cysts within the left lobe and to a lesser degree the right lobe of the liver.  Spleen, adrenal glands are within normal limits. Chronic changes of the kidneys bilaterally. Tiny hypodensities in the lower pole of left kidney are too small to characterize. Simple cyst in the right kidney. Calculus in the lower pole of the right kidney.  The pancreas is atrophic.  No obvious pancreatic head mass.  A gastrostomy tube is in place. There is gas within the abdominal wall compatible with recent intervention. The bulb of the gastrostomy tube is appropriately positioned within the lumen of the stomach. There is a small amount of free intraperitoneal gas adjacent to the gastrostomy site.  There is gas in the subcutaneous fat bilaterally, more so  on the right of unknown significance.  Left inguinal hernia  is again noted and now contains a small amount of free fluid.  Atherosclerotic changes in the abdomen are stable. Juxtarenal abdominal aortic aneurysm measures 3.0 cm. This is state  Bladder is unremarkable.  Images of the uterus demonstrate finding suspicious for endometrial thickening diverticulosis of the sigmoid colon is present without evidence of acute diverticulitis. Mild distention of the colon.  Degenerative changes of the lumbar spine. No vertebral compression deformity. Mild dextroscoliosis of the lumbar spine.   Electronically Signed   By: Maryclare Bean M.D.   On: 05/16/2013 16:08    Scheduled Meds: . amLODipine  10 mg Per Tube Q breakfast  . enoxaparin (LOVENOX) injection  40 mg Subcutaneous Q24H  . feeding supplement (ENSURE)  1 Container Oral TID BM  . furosemide  40 mg Intravenous Q12H  . hydrALAZINE  25 mg Per Tube Q8H  . levothyroxine  150 mcg Per Tube QAC breakfast  . piperacillin-tazobactam (ZOSYN)  IV  3.375 g Intravenous Q8H  . potassium chloride  20 mEq Per Tube BID  . potassium chloride  20 mEq Per Tube Once   Continuous Infusions: . dextrose 5 % and 0.9 % NaCl with KCl 20 mEq/L 10 mL/hr (05/16/13 2129)    Active Problems:   Cellulitis   Fever    Time spent:    Kela Millin  [facility] Pager [phone]. If 7PM-7AM, please contact night-coverage at [email or web address], password [facility] 05/18/2013, 10:49 AM  LOS: 2 days

## 2013-05-18 NOTE — Progress Notes (Signed)
Subjective: Pt doing well today.  More awake and interactive today.  Objective: Vital signs in last 24 hours: Temp:  [98.2 F (36.8 C)-98.5 F (36.9 C)] 98.2 F (36.8 C) (12/01 0531) Pulse Rate:  [84-102] 102 (12/01 0531) Resp:  [18] 18 (12/01 0531) BP: (100-133)/(63-67) 130/63 mmHg (12/01 0839) SpO2:  [94 %-98 %] 97 % (12/01 0531) Last BM Date: 05/15/13  Intake/Output from previous day: 11/30 0701 - 12/01 0700 In: 885.2 [P.O.:240; I.V.:295.2; IV Piggyback:150] Out: 2550 [Urine:2550] Intake/Output this shift:    General appearance: alert and cooperative Cardio: regular rate and rhythm, S1, S2 normal, no murmur, click, rub or gallop GI: soft, NT, ND active bowel sounds, G-tube in place some drainage from site, no erythema laterally  Lab Results:   Recent Labs  05/16/13 1430 05/17/13 0411  WBC 10.8* 8.4  HGB 14.9 13.7  HCT 41.5 39.4  PLT 240 222   BMET  Recent Labs  05/17/13 0411 05/18/13 0407  NA 137 137  K 3.3* 3.4*  CL 96 93*  CO2 31 32  GLUCOSE 104* 127*  BUN 10 11  CREATININE 0.90 1.08  CALCIUM 9.0 9.3   PT/INR No results found for this basename: LABPROT, INR,  in the last 72 hours ABG No results found for this basename: PHART, PCO2, PO2, HCO3,  in the last 72 hours  Studies/Results: Dg Chest 2 View  05/16/2013   CLINICAL DATA:  Fever and cough.  EXAM: CHEST - 2 VIEW  COMPARISON:  05/14/2013  FINDINGS: Previous CABG. Heart size upper limits normal. Moderate bilateral pleural effusions with consolidation/ atelectasis in the lower lobes, left greater than right. No overt interstitial edema.  IMPRESSION: Persistent pleural effusions and bibasilar consolidation/ atelectasis.   Electronically Signed   By: Oley Balm M.D.   On: 05/16/2013 14:24   Ct Chest Wo Contrast  05/16/2013   CLINICAL DATA:  79 year old female with fever and weakness. Status post laparoscopic hiatal hernia repair 4 days ago.  EXAM: CT CHEST WITHOUT CONTRAST  TECHNIQUE:  Multidetector CT imaging of the chest was performed following the standard protocol without IV contrast. Oral contrast was administered immediately before the study.  COMPARISON:  05/16/2013 abdominal CT  FINDINGS: Contrast within the entire esophagus to the stomach is noted without evidence of esophageal leak.  Postoperative changes from hiatal hernia repair identified. Focal collections are noted in the distal paraesophageal region, measuring 5.3 x 8.6 cm on the right and 2.4 x 3.9 cm on the left. These collections narrow the distal esophagus and may represent postoperative collections such as seroma, lymphocele or evolving hematoma, but abscess is not excluded.  Small to moderate bilateral pleural effusions are present with moderate bibasilar atelectasis.  Cardiomegaly with evidence of previous cardiac surgery noted.  There is no evidence of pericardial effusion or enlarged lymph nodes.  A tiny amount of gas within the anterior mediastinum and upper left chest wall may be related to recent surgery.  No acute or suspicious bony abnormalities are identified.  IMPRESSION: No evidence of esophageal leak.  Distal paraesophageal collections, 5.3 x 8.6 cm on the right and 2.4 x 3.9 cm on the left. Although these may represent postoperative collections, abscess is not excluded - correlate clinically and consider sampling.  Small to moderate bilateral pleural effusions with moderate bibasilar atelectasis.  Small foci of gas within the anterior mediastinum and upper left chest - likely related to recent surgery.   Electronically Signed   By: Laveda Abbe M.D.   On:  05/16/2013 18:01   Ct Abdomen Pelvis W Contrast  05/16/2013   CLINICAL DATA:  Decreased appetite. Recent hernia repair. Postop day number 4.  EXAM: CT ABDOMEN AND PELVIS WITH CONTRAST  TECHNIQUE: Multidetector CT imaging of the abdomen and pelvis was performed using the standard protocol following bolus administration of intravenous contrast.  CONTRAST:  50mL  OMNIPAQUE IOHEXOL 300 MG/ML SOLN, OMNIPAQUE IOHEXOL 300 MG/ML SOLN  COMPARISON:  04/20/2013  FINDINGS: The large hiatal hernia has been repaired. The stomach is now all within the abdomen, below the diaphragm. The distal esophagus is abnormal. There is wall thickening and low density within the esophageal wall. There is a complex fluid collection to the right of the distal esophagus measuring 9.6 x 6.4 cm in the lower medial right hemi thorax. There are hyperdense areas layering inferiorly. The esophageal wall is inseparable from the fluid collection. There is a focal blush of high-density on image number 1 of this study adjacent to the esophagus. These findings raise the possibility of esophageal leak.  Small bilateral pleural effusions are associated with bibasilar atelectasis versus airspace disease. No pneumothorax. Stable appearance of the heart.  The gallbladder is absent. There is associated dilatation of the common bile duct and biliary tree, especially the left lobe. There are several benign appearing cysts within the left lobe and to a lesser degree the right lobe of the liver.  Spleen, adrenal glands are within normal limits. Chronic changes of the kidneys bilaterally. Tiny hypodensities in the lower pole of left kidney are too small to characterize. Simple cyst in the right kidney. Calculus in the lower pole of the right kidney.  The pancreas is atrophic.  No obvious pancreatic head mass.  A gastrostomy tube is in place. There is gas within the abdominal wall compatible with recent intervention. The bulb of the gastrostomy tube is appropriately positioned within the lumen of the stomach. There is a small amount of free intraperitoneal gas adjacent to the gastrostomy site.  There is gas in the subcutaneous fat bilaterally, more so on the right of unknown significance.  Left inguinal hernia is again noted and now contains a small amount of free fluid.  Atherosclerotic changes in the abdomen are  stable. Juxtarenal abdominal aortic aneurysm measures 3.0 cm. This is state  Bladder is unremarkable.  Images of the uterus demonstrate finding suspicious for endometrial thickening diverticulosis of the sigmoid colon is present without evidence of acute diverticulitis. Mild distention of the colon.  Degenerative changes of the lumbar spine. No vertebral compression deformity. Mild dextroscoliosis of the lumbar spine.   Electronically Signed   By: Maryclare Bean M.D.   On: 05/16/2013 16:08    Anti-infectives: Anti-infectives   Start     Dose/Rate Route Frequency Ordered Stop   05/16/13 2200  piperacillin-tazobactam (ZOSYN) IVPB 3.375 g     3.375 g 12.5 mL/hr over 240 Minutes Intravenous 3 times per day 05/16/13 1930        Assessment/Plan: s/p * No surgery found * Advance diet to full liq PT to ambulate OOBTC Cont abx  LOS: 2 days    Marigene Ehlers., [employer] 05/18/2013

## 2013-05-19 LAB — BASIC METABOLIC PANEL
BUN: 18 mg/dL (ref 6–23)
Calcium: 8.9 mg/dL (ref 8.4–10.5)
Creatinine, Ser: 1.14 mg/dL — ABNORMAL HIGH (ref 0.50–1.10)
GFR calc Af Amer: 50 mL/min — ABNORMAL LOW (ref >90)
GFR calc non Af Amer: 44 mL/min — ABNORMAL LOW (ref >90)
Glucose, Bld: 116 mg/dL — ABNORMAL HIGH (ref 70–99)
Potassium: 3.9 mEq/L (ref 3.5–5.1)

## 2013-05-19 MED ORDER — SULFAMETHOXAZOLE-TRIMETHOPRIM 200-40 MG/5ML PO SUSP
20.0000 mL | ORAL | Status: DC
  Administered 2013-05-20: 11:00:00 20 mL
  Filled 2013-05-19 (×3): qty 20

## 2013-05-19 MED ORDER — POTASSIUM CHLORIDE 20 MEQ/15ML (10%) PO LIQD
20.0000 meq | Freq: Every day | ORAL | Status: DC
  Administered 2013-05-20 – 2013-05-21 (×2): 20 meq
  Filled 2013-05-19 (×2): qty 15

## 2013-05-19 NOTE — Progress Notes (Signed)
[facility] PROGRESS NOTE/CONSULT  ETTY ISAAC ZOX:[id number] DOB: June 04, 1929 DOA: 05/16/2013 PCP: Dois Davenport., MD  Assessment/Plan: 1.Low grade fever-Postop day #4, status post laparoscopic hiatal hernia repair with Gastrostomy tube placement  -Gastrostomy site infection/evolving abdominal wall cellulitis vs paraesophageal fluid collection infection  versus infection of versus atelectasis or developing pneumonia  -imaging stuidies not very convincing for pneumonia, and denies cough will continue will continue to hold off broadening abx for a PNA. -continue IV Zosyn for now, clinically improving remains afebrile and hemodyamically stable, agree with surgery plan to follow and change to oral antibiotics.  -Will sign off at this time, as she is clinically improved. 2. Recent sinus bradycardia with junctional rhythm:  -Was seen by cards last admission and labetalol was discontinued,  -K. replaced, Mag normal on 11/25,  -Stable, pt in NSR  off lebatolol>> would keep off labetalol on discharge he has in had been discontinued last admission as noted above 3. Pleural effusions  -post operative, likely related to hiatal hernia repair, could also be iatrogenic from fluid reuscitation  -ECHO 10/14 with normal EF 55-60% and grade 1 diastolic dysfunction  -Patient clinically improved no shortness of breath. Lasix DC'd 12/1 -Continue HCTZ 4. Hypokalemia -d/t diuretics  -Resolved,K repleted. 5. HTN -Continue Norvasc   Code Status: full Family Communication: 2 daughters at bedside Disposition Plan: per primary team, will sign off at this time please call back if needed     Procedures:  none  Antibiotics:  Zosyn started on 11/29  HPI/Subjective: Sitting up in chair, denies any complaints.  Objective: Filed Vitals:   05/19/13 0905  BP: 128/66  Pulse:   Temp:   Resp:     Intake/Output Summary (Last 24 hours) at 05/19/13 1123 Last data filed at 05/19/13 0508  Gross  per 24 hour  Intake    530 ml  Output   1900 ml  Net  -1370 ml   Filed Weights   05/16/13 2100 05/17/13 0525  Weight: 64.7 kg (142 lb 10.2 oz) 64.048 kg (141 lb 3.2 oz)    Exam:  General: alert & oriented x 3 In NAD Cardiovascular: RRR, nl S1 s2 Respiratory: CTAB Abdomen: soft +BS NT/ND, no masses palpable, L. Side of abd with mild erythema and PEG site with dressing with minimal drainage. Extremities: No cyanosis and no edema   Data Reviewed: Basic Metabolic Panel:  Recent Labs Lab 05/13/13 0510 05/16/13 1430 05/17/13 0411 05/18/13 0407 05/19/13 0654  NA 137 131* 137 137 136  K 3.7 2.8* 3.3* 3.4* 3.9  CL 104 94* 96 93* 96  CO2 25 28 31  32 29  GLUCOSE 139* 129* 104* 127* 116*  BUN 14 12 10 11 18   CREATININE 0.86 0.92 0.90 1.08 1.14*  CALCIUM 8.6 9.8 9.0 9.3 8.9  MG 2.5  --   --   --   --   PHOS 2.5  --   --   --   --    Liver Function Tests:  Recent Labs Lab 05/13/13 0510 05/16/13 1430 05/17/13 0411  AST 32 20 21  ALT 21 16 15   ALKPHOS 50 70 67  BILITOT 0.7 0.9 0.9  PROT 5.6* 6.1 5.6*  ALBUMIN 2.9* 2.6* 2.4*    Recent Labs Lab 05/16/13 1430  LIPASE 15   No results found for this basename: AMMONIA,  in the last 168 hours CBC:  Recent Labs Lab 05/13/13 0510 05/16/13 1430 05/17/13 0411  WBC 8.9 10.8* 8.4  NEUTROABS  --  8.9*  --   HGB 14.2 14.9 13.7  HCT 41.4 41.5 39.4  MCV 89.0 86.8 87.2  PLT 164 240 222   Cardiac Enzymes: No results found for this basename: CKTOTAL, CKMB, CKMBINDEX, TROPONINI,  in the last 168 hours BNP (last 3 results) No results found for this basename: PROBNP,  in the last 8760 hours CBG: No results found for this basename: GLUCAP,  in the last 168 hours  Recent Results (from the past 240 hour(s))  CULTURE, BLOOD (ROUTINE X 2)     Status: None   Collection Time    05/16/13  2:25 PM      Result Value Range Status   Specimen Description BLOOD RIGHT ARM  3 ML IN [facility] BOTTLE   Final   Special Requests NONE   Final    Culture  Setup Time     Final   Value: 05/16/2013 17:23     Performed at [facility]   Culture     Final   Value:        BLOOD CULTURE RECEIVED NO GROWTH TO DATE CULTURE WILL BE HELD FOR 5 DAYS BEFORE ISSUING A FINAL NEGATIVE REPORT     Performed at [facility]   Report Status PENDING   Incomplete  CULTURE, BLOOD (ROUTINE X 2)     Status: None   Collection Time    05/16/13  2:30 PM      Result Value Range Status   Specimen Description BLOOD RIGHT FOREARM  4 ML IN AEROBIC ONLY   Final   Special Requests NONE   Final   Culture  Setup Time     Final   Value: 05/16/2013 17:23     Performed at [facility]   Culture     Final   Value:        BLOOD CULTURE RECEIVED NO GROWTH TO DATE CULTURE WILL BE HELD FOR 5 DAYS BEFORE ISSUING A FINAL NEGATIVE REPORT     Performed at [facility]   Report Status PENDING   Incomplete  URINE CULTURE     Status: None   Collection Time    05/16/13  4:23 PM      Result Value Range Status   Specimen Description URINE, RANDOM   Final   Special Requests NONE   Final   Culture  Setup Time     Final   Value: 05/16/2013 20:20     Performed at [facility] Count     Final   Value: 55,000 COLONIES/ML     Performed at [facility]   Culture     Final   Value: Multiple bacterial morphotypes present, none predominant. Suggest appropriate recollection if clinically indicated.     Performed at [facility]   Report Status 05/17/2013 FINAL   Final     Studies: No results found.  Scheduled Meds: . amLODipine  10 mg Per Tube Q breakfast  . enoxaparin (LOVENOX) injection  40 mg Subcutaneous Q24H  . feeding supplement (ENSURE)  1 Container Oral TID BM  . hydrALAZINE  25 mg Per Tube Q8H  . hydrochlorothiazide  25 mg Oral Daily  . levothyroxine  150 mcg Per Tube QAC breakfast  . piperacillin-tazobactam (ZOSYN)  IV  3.375 g Intravenous Q8H  . potassium chloride  20 mEq Per Tube BID    Continuous Infusions: . dextrose 5 % and 0.9 % NaCl with KCl 20 mEq/L 10 mL/hr (05/16/13  2129)    Active Problems:   Cellulitis   Fever    Time spent:    Kela Millin  [facility] Pager [phone]. If 7PM-7AM, please contact night-coverage at [email or web address], password [facility] 05/19/2013, 11:23 AM  LOS: 3 days

## 2013-05-19 NOTE — Progress Notes (Signed)
Clinical Social Work Department BRIEF PSYCHOSOCIAL ASSESSMENT 05/19/2013  Patient:  Wanda Robinson, Wanda Robinson     Account Number:  [id number]     Admit date:  05/16/2013  Clinical Social Worker:  Orpah Greek  Date/Time:  05/19/2013 03:00 PM  Referred by:  Physician  Date Referred:  05/19/2013 Referred for  SNF Placement   Other Referral:   Interview type:  Patient Other interview type:   and daughter, Lynden Ang at bedside    PSYCHOSOCIAL DATA Living Status:  ALONE Admitted from facility:   Level of care:   Primary support name:  Waymon Budge (daughter) cell#: [phone] Primary support relationship to patient:  CHILD, ADULT Degree of support available:   good    CURRENT CONCERNS Current Concerns  Post-Acute Placement   Other Concerns:    SOCIAL WORK ASSESSMENT / PLAN CSW received consult for SNF placement at discharge.   Assessment/plan status:  Information/Referral to [facility] Other assessment/ plan:   Information/referral to community resources:   CSW completed FL2 and faxed information out to [facility] - provided bed offers to patient/daughter who have accepted bed offer at [facility]. CSW made [facility] aware.    PATIENT'S/FAMILY'S RESPONSE TO PLAN OF CARE: Patient states that she has never been to a SNF before, but knows of people that have and is familiar with some facilities through them. Patient is expecting to stay at [facility] no more than 2 weeks before returning home.       Unice Bailey, LCSW [facility] Clinical Social Worker cell #: [phone]

## 2013-05-19 NOTE — Evaluation (Signed)
Occupational Therapy Evaluation Patient Details Name: Wanda Robinson MRN: [id number] DOB: 05/25/1929 Today's Date: 05/19/2013 Time: 0865-7846 OT Time Calculation (min): 29 min  OT Assessment / Plan / Recommendation History of present illness s/p recent lap hiatal hernia repair with gastrostomy tube placement.  She was discharged yesterday.  Her family brought her in today with lethargy and not eating.  She is able to eat but not wanting to take anything in.  She has also had a cough and wheezing.  Temp of 100.  She is not having much abdominal pain.     Clinical Impression   Pt up to toilet with no complaint of pain. Note some unsteadiness with transfer into bathroom especially during pt conversation/distraction. Abdominal binder in room is soiled so nursing to order new one. Pt appears agreeable to SNF at d/c.      OT Assessment  Patient needs continued OT Services    Follow Up Recommendations  SNF;Supervision/Assistance - 24 hour    Barriers to Discharge      Equipment Recommendations  None recommended by OT    Recommendations for Other Services    Frequency  Min 2X/week    Precautions / Restrictions Precautions Precautions: Fall Precaution Comments: soiled abdominal binder. informed nursing who will request new binder Required Braces or Orthoses: Other Brace/Splint Other Brace/Splint: abdominal binder   Pertinent Vitals/Pain No complaint of pain 92% on RA after toilet transfer; quickly up to 95% with about 10 seconds of rest. Replaced O2 at end of session     ADL  Eating/Feeding: Simulated;Independent Where Assessed - Eating/Feeding: Chair Grooming: Simulated;Wash/dry hands;Set up Where Assessed - Grooming: Supported sitting Upper Body Bathing: Simulated;Chest;Right arm;Left arm;Abdomen;Set up Where Assessed - Upper Body Bathing: Unsupported sitting Lower Body Bathing: Simulated;Minimal assistance Where Assessed - Lower Body Bathing: Supported sit to stand Upper Body  Dressing: Simulated;Minimal assistance Where Assessed - Upper Body Dressing: Unsupported sitting Lower Body Dressing: Simulated;Minimal assistance Where Assessed - Lower Body Dressing: Supported sit to stand Toilet Transfer: Performed;Minimal assistance (hand held assist to toilet) Acupuncturist: Comfort height toilet;Grab bars Toileting - Clothing Manipulation and Hygiene: Simulated;Minimal assistance Where Assessed - Engineer, mining and Hygiene: Sit to stand from 3-in-1 or toilet ADL Comments: Up to bathroom with hand held assist. With conversation, pt had a small LOB requiring min assist to regain balance. Pt able to cross bilateral LE up to don socks from EOC. Family states they would like to plan SNF at d/c to help increase pt's independence at they state 24/7 would be difficult to manage.    OT Diagnosis: Generalized weakness  OT Problem List: Decreased strength;Decreased knowledge of use of DME or AE OT Treatment Interventions: Self-care/ADL training;DME and/or AE instruction;Therapeutic activities;Patient/family education   OT Goals(Current goals can be found in the care plan section) Acute Rehab OT Goals Patient Stated Goal: home OT Goal Formulation: With patient/family Time For Goal Achievement: 06/02/13 Potential to Achieve Goals: Good  Visit Information  Last OT Received On: 05/19/13 Assistance Needed: +1 History of Present Illness: s/p recent lap hiatal hernia repair with gastrostomy tube placement.  She was discharged yesterday.  Her family brought her in today with lethargy and not eating.  She is able to eat but not wanting to take anything in.  She has also had a cough and wheezing.  Temp of 100.  She is not having much abdominal pain.         Prior Functioning     Home Living Family/patient expects  to be discharged to:: Skilled nursing facility Living Arrangements: Children Available Help at Discharge: Family;Available  PRN/intermittently Additional Comments: pt's family planning SNF as they state 24/7 would be difficult to manage. Pt was only home about 12 hours before readmitted. Prior Function Level of Independence: Independent (Independent prior to recent hospitalization) Comments: family was at home with her when discharged. up with stand by assist and min assist ADL. Communication Communication: No difficulties         Vision/Perception     Cognition  Cognition Arousal/Alertness: Awake/alert Behavior During Therapy: WFL for tasks assessed/performed Overall Cognitive Status: History of cognitive impairments - at baseline    Extremity/Trunk Assessment Upper Extremity Assessment Upper Extremity Assessment: Overall WFL for tasks assessed     Mobility Transfers Transfers: Sit to Stand;Stand to Sit Sit to Stand: 4: Min assist;With upper extremity assist;From chair/3-in-1;From toilet Stand to Sit: 4: Min assist;With upper extremity assist;To chair/3-in-1;To toilet Details for Transfer Assistance: verbal cues for hand placement and safety     Exercise     Balance Static Sitting Balance Static Sitting - Balance Support: Feet unsupported;No upper extremity supported Static Sitting - Level of Assistance: 5: Stand by assistance   End of Session OT - End of Session Equipment Utilized During Treatment: Other (comment) (abdominal binder soiled. new one requested.) Activity Tolerance: Patient tolerated treatment well Patient left: in chair;with call bell/phone within reach;with family/visitor present Nurse Communication: Mobility status  GO     Lennox Laity [phone] 05/19/2013, 1:09 PM

## 2013-05-19 NOTE — Evaluation (Signed)
Physical Therapy Evaluation Patient Details Name: Wanda Robinson MRN: [id number] DOB: 1928-08-29 Today's Date: 05/19/2013 Time: 4098-1191 PT Time Calculation (min): 15 min  PT Assessment / Plan / Recommendation History of Present Illness  s/p recent lap hiatal hernia repair with gastrostomy tube placement.  She was recently discharged.  Her family brought her in today with lethargy and not eating.  She is able to eat but not wanting to take anything in.  She has also had a cough and wheezing.  Temp of 100.  She is not having much abdominal pain. Pt admitted with Low grade fever-Postop day #4, status post laparoscopic hiatal hernia repair with Gastrostomy tube placement and Gastrostomy site infection/evolving abdominal wall cellulitis vs paraesophageal fluid collection infection  versus infection of versus atelectasis or developing pneumonia    Clinical Impression  Pt admitted with above. Pt currently with functional limitations due to the deficits listed below (see PT Problem List).  Pt will benefit from skilled PT to increase their independence and safety with mobility to allow discharge to the venue listed below.  Pt with recent admission home and then readmitted to hospital in less than one day per daughter so plan is for ST-SNF upon d/c.     PT Assessment  Patient needs continued PT services    Follow Up Recommendations  SNF    Does the patient have the potential to tolerate intense rehabilitation      Barriers to Discharge        Equipment Recommendations  None recommended by PT    Recommendations for Other Services     Frequency Min 3X/week    Precautions / Restrictions Precautions Precautions: Fall Precaution Comments: soiled abdominal binder. new abdominal binder not yet in room Required Braces or Orthoses: Other Brace/Splint Other Brace/Splint: abdominal binder   Pertinent Vitals/Pain Pt denies at rest, repositioned to comfort SaO2 98-100% room air after ambulation on  room air so did not reapply O2 [place] and notified RN      Mobility  Bed Mobility Bed Mobility: Not assessed Transfers Transfers: Sit to Stand;Stand to Sit Sit to Stand: 4: Min guard;With upper extremity assist;From toilet;4: Min assist;From chair/3-in-1 Stand to Sit: 4: Min guard;With upper extremity assist;To toilet;To chair/3-in-1;With armrests Details for Transfer Assistance: min assist from toilet, cued to use grab bar however pt did not and required assist to control descent, min/guard with recliner as pt able to use armrests Ambulation/Gait Ambulation/Gait Assistance: 4: Min guard Ambulation Distance (Feet): 80 Feet Assistive device: Other (Comment) Ambulation/Gait Assistance Details: pt assisted to bathroom with no AD however unsteady gait observed so pt pushed IV pole in hallway, may try assistive device next visit Gait Pattern: Step-through pattern;Decreased stride length Gait velocity: decreased    Exercises     PT Diagnosis: Difficulty walking  PT Problem List: Decreased activity tolerance;Decreased balance;Decreased mobility;Decreased knowledge of use of DME;Decreased strength PT Treatment Interventions: DME instruction;Gait training;Functional mobility training;Therapeutic activities;Therapeutic exercise;Patient/family education     PT Goals(Current goals can be found in the care plan section) Acute Rehab PT Goals Patient Stated Goal: home PT Goal Formulation: With patient/family Time For Goal Achievement: 05/26/13 Potential to Achieve Goals: Good  Visit Information  Last PT Received On: 05/19/13 Assistance Needed: +1 History of Present Illness: s/p recent lap hiatal hernia repair with gastrostomy tube placement.  She was recently discharged.  Her family brought her in today with lethargy and not eating.  She is able to eat but not wanting to take anything in.  She has also had a cough and wheezing.  Temp of 100.  She is not having much abdominal pain. Pt admitted  with Low grade fever-Postop day #4, status post laparoscopic hiatal hernia repair with Gastrostomy tube placement and Gastrostomy site infection/evolving abdominal wall cellulitis vs paraesophageal fluid collection infection  versus infection of versus atelectasis or developing pneumonia         Prior Functioning  Home Living Family/patient expects to be discharged to:: Skilled nursing facility Living Arrangements: Children Available Help at Discharge: Family;Available PRN/intermittently Additional Comments: pt's family planning SNF as they state 24/7 would be difficult to manage. Pt was only home about 12 hours before readmitted. Prior Function Level of Independence: Independent Comments: family was at home with her when discharged. up with stand by assist and min assist ADL. Communication Communication: No difficulties    Cognition  Cognition Arousal/Alertness: Awake/alert Behavior During Therapy: WFL for tasks assessed/performed Overall Cognitive Status: History of cognitive impairments - at baseline Memory: Decreased short-term memory    Extremity/Trunk Assessment Upper Extremity Assessment Upper Extremity Assessment: Defer to OT evaluation Lower Extremity Assessment Lower Extremity Assessment: Generalized weakness   Balance Static Sitting Balance Static Sitting - Balance Support: Feet unsupported;No upper extremity supported Static Sitting - Level of Assistance: 5: Stand by assistance  End of Session PT - End of Session Activity Tolerance: Patient tolerated treatment well Patient left: in chair;with call bell/phone within reach;with family/visitor present  GP     Andros Channing,KATHrine E 05/19/2013, 3:06 PM Zenovia Jarred, PT, DPT 05/19/2013 Pager: [phone]

## 2013-05-19 NOTE — Progress Notes (Signed)
  Subjective: Pt doing well.  Taking PO Amb with asst last night  Objective: Vital signs in last 24 hours: Temp:  [97.6 F (36.4 C)-98.2 F (36.8 C)] 98.2 F (36.8 C) (12/02 0505) Pulse Rate:  [68-91] 68 (12/02 0505) Resp:  [20] 20 (12/02 0505) BP: (110-145)/(57-67) 128/66 mmHg (12/02 0905) SpO2:  [96 %-97 %] 97 % (12/02 0505) Last BM Date: 05/18/13  Intake/Output from previous day: 12/01 0701 - 12/02 0700 In: 530 [P.O.:480; IV Piggyback:50] Out: 1900 [Urine:1900] Intake/Output this shift:    General appearance: alert and cooperative Cardio: regular rate and rhythm, S1, S2 normal, no murmur, click, rub or gallop GI: Gtube in place, no erythema or induration lateral to wound today  Lab Results:   Recent Labs  05/16/13 1430 05/17/13 0411  WBC 10.8* 8.4  HGB 14.9 13.7  HCT 41.5 39.4  PLT 240 222   BMET  Recent Labs  05/18/13 0407 05/19/13 0654  NA 137 136  K 3.4* 3.9  CL 93* 96  CO2 32 29  GLUCOSE 127* 116*  BUN 11 18  CREATININE 1.08 1.14*  CALCIUM 9.3 8.9   PT/INR No results found for this basename: LABPROT, INR,  in the last 72 hours ABG No results found for this basename: PHART, PCO2, PO2, HCO3,  in the last 72 hours  Studies/Results: No results found.  Anti-infectives: Anti-infectives   Start     Dose/Rate Route Frequency Ordered Stop   05/16/13 2200  piperacillin-tazobactam (ZOSYN) IVPB 3.375 g     3.375 g 12.5 mL/hr over 240 Minutes Intravenous 3 times per day 05/16/13 1930        Assessment/Plan: s/p * No surgery found * Cont' FLD Will have SW assess for possible SNF placement Cont with PT Will convert abx to PO  Dispo - Dc to SNF vs home in 1-2 days if con't to improve.  LOS: 3 days    Marigene Ehlers., [facility] 05/19/2013

## 2013-05-19 NOTE — Progress Notes (Signed)
Patient has accepted bed offer at [facility], anticipating possible discharge tomorrow or Thursday. CSW will follow-up in the morning.   Clinical Social Work Department CLINICAL SOCIAL WORK PLACEMENT NOTE 05/19/2013  Patient:  Wanda Robinson, Wanda Robinson  Account Number:  [id number] Admit date:  05/16/2013  Clinical Social Worker:  Orpah Greek  Date/time:  05/19/2013 03:11 PM  Clinical Social Work is seeking post-discharge placement for this patient at the following level of care:   SKILLED NURSING   (*CSW will update this form in Epic as items are completed)   05/19/2013  Patient/family provided with [facility] Department of Clinical Social Work's list of facilities offering this level of care within the geographic area requested by the patient (or if unable, by the patient's family).  05/19/2013  Patient/family informed of their freedom to choose among providers that offer the needed level of care, that participate in Medicare, Medicaid or managed care program needed by the patient, have an available bed and are willing to accept the patient.  05/19/2013  Patient/family informed of [facility]' ownership interest in [facility], as well as of the fact that they are under no obligation to receive care at this facility.  PASARR submitted to EDS on 05/19/2013 PASARR number received from EDS on 05/19/2013  FL2 transmitted to all facilities in geographic area requested by pt/family on  05/19/2013 FL2 transmitted to all facilities within larger geographic area on   Patient informed that his/her managed care company has contracts with or will negotiate with  certain facilities, including the following:     Patient/family informed of bed offers received:  05/19/2013 Patient chooses bed at [facility] Physician recommends and patient chooses bed at    Patient to be transferred to [facility] on   Patient to be transferred to facility by   The following physician request  were entered in Epic:   Additional Comments:   Unice Bailey, LCSW [facility] Clinical Social Worker cell #: [phone]

## 2013-05-20 NOTE — Progress Notes (Signed)
Physical Therapy Treatment Patient Details Name: Wanda Robinson MRN: [id number] DOB: 08-02-1928 Today's Date: 05/20/2013 Time: 7829-5621 PT Time Calculation (min): 25 min  PT Assessment / Plan / Recommendation  History of Present Illness s/p recent lap hiatal hernia repair with gastrostomy tube placement.  She was recently discharged.  Her family brought her in today with lethargy and not eating.  She is able to eat but not wanting to take anything in.  She has also had a cough and wheezing.  Temp of 100.  She is not having much abdominal pain. Pt admitted with Low grade fever-Postop day #4, status post laparoscopic hiatal hernia repair with Gastrostomy tube placement and Gastrostomy site infection/evolving abdominal wall cellulitis vs paraesophageal fluid collection infection  versus infection of versus atelectasis or developing pneumonia     PT Comments   Pt ambulated in hallway and performed exercises.  Pt very pleasant and cooperative.  Plans to d/c to SNF tomorrow.   Follow Up Recommendations  SNF     Does the patient have the potential to tolerate intense rehabilitation     Barriers to Discharge        Equipment Recommendations  None recommended by PT    Recommendations for Other Services    Frequency Min 3X/week   Progress towards PT Goals Progress towards PT goals: Progressing toward goals  Plan Current plan remains appropriate    Precautions / Restrictions Precautions Precautions: Fall Required Braces or Orthoses: Other Brace/Splint Other Brace/Splint: new abdominal binder in room and PT donned for pt   Pertinent Vitals/Pain n/a    Mobility  Bed Mobility Bed Mobility: Not assessed Transfers Transfers: Sit to Stand;Stand to Sit Sit to Stand: 4: Min guard;From chair/3-in-1;With upper extremity assist;With armrests;5: Supervision Stand to Sit: To chair/3-in-1;4: Min guard;With upper extremity assist;With armrests;5: Supervision Details for Transfer Assistance: no cues  required Ambulation/Gait Ambulation/Gait Assistance: 4: Min guard;5: Supervision Ambulation Distance (Feet): 360 Feet Assistive device: Rolling walker Ambulation/Gait Assistance Details: pt appears more steady using RW today and able to tolerate increased distance as well Gait Pattern: Step-through pattern;Decreased stride length Gait velocity: decreased    Exercises General Exercises - Lower Extremity Ankle Circles/Pumps: AROM;Both;20 reps Long Arc Quad: AROM;Both;15 reps;Seated Hip ABduction/ADduction: AROM;Both;15 reps;Supine Hip Flexion/Marching: AROM;Both;15 reps;Supine Heel Raises: AROM;Both;15 reps;Standing;Other (comment) (bil UE support) Other Exercises Other Exercises: hip extension standing with bil UE support x15 bilateral LE   PT Diagnosis:    PT Problem List:   PT Treatment Interventions:     PT Goals (current goals can now be found in the care plan section)    Visit Information  Last PT Received On: 05/20/13 Assistance Needed: +1 History of Present Illness: s/p recent lap hiatal hernia repair with gastrostomy tube placement.  She was recently discharged.  Her family brought her in today with lethargy and not eating.  She is able to eat but not wanting to take anything in.  She has also had a cough and wheezing.  Temp of 100.  She is not having much abdominal pain. Pt admitted with Low grade fever-Postop day #4, status post laparoscopic hiatal hernia repair with Gastrostomy tube placement and Gastrostomy site infection/evolving abdominal wall cellulitis vs paraesophageal fluid collection infection  versus infection of versus atelectasis or developing pneumonia      Subjective Data      Cognition  Cognition Arousal/Alertness: Awake/alert Behavior During Therapy: WFL for tasks assessed/performed Overall Cognitive Status: History of cognitive impairments - at baseline Memory: Decreased short-term memory  Balance     End of Session PT - End of Session Activity  Tolerance: Patient tolerated treatment well Patient left: in chair;with chair alarm set;with call bell/phone within reach   GP     [facility] E 05/20/2013, 12:39 PM Zenovia Jarred, PT, DPT 05/20/2013 Pager: [phone]

## 2013-05-20 NOTE — Progress Notes (Signed)
  Subjective: Pt had a good night, slept most of the night w/o confusion. Con't to work on taking more PO.  Objective: Vital signs in last 24 hours: Temp:  [98.2 F (36.8 C)-98.5 F (36.9 C)] 98.5 F (36.9 C) (12/03 0507) Pulse Rate:  [79-91] 79 (12/03 0507) Resp:  [18-20] 18 (12/03 0507) BP: (126-144)/(56-70) 144/56 mmHg (12/03 0507) SpO2:  [92 %-98 %] 94 % (12/03 0507) Last BM Date: 05/18/13  Intake/Output from previous day: 12/02 0701 - 12/03 0700 In: 1299 [P.O.:800; I.V.:499] Out: 975 [Urine:975] Intake/Output this shift:    General appearance: alert and cooperative Resp: clear to auscultation bilaterally Cardio: regular rate and rhythm, S1, S2 normal, no murmur, click, rub or gallop GI: soft, non-tender; bowel sounds normal; no masses,  no organomegaly, wound c/d/i, Gtube in place  Lab Results:  No results found for this basename: WBC, HGB, HCT, PLT,  in the last 72 hours BMET  Recent Labs  05/18/13 0407 05/19/13 0654  NA 137 136  K 3.4* 3.9  CL 93* 96  CO2 32 29  GLUCOSE 127* 116*  BUN 11 18  CREATININE 1.08 1.14*  CALCIUM 9.3 8.9    Anti-infectives: Anti-infectives   Start     Dose/Rate Route Frequency Ordered Stop   05/20/13 0900  sulfamethoxazole-trimethoprim (BACTRIM,SEPTRA) 200-40 MG/5ML suspension 20 mL     20 mL Per Tube Once per day on Mon Wed Fri 05/19/13 1331     05/16/13 2200  piperacillin-tazobactam (ZOSYN) IVPB 3.375 g  Status:  Discontinued     3.375 g 12.5 mL/hr over 240 Minutes Intravenous 3 times per day 05/16/13 1930 05/19/13 1331      Assessment/Plan:  S/p lap hiatal hernia repair d/c foley Cont' to encourage PO Ambulate in hall TID Will plan DC to SNF Thurs is pt con't to do well.  LOS: 4 days    Marigene Ehlers., [facility] 05/20/2013

## 2013-05-20 NOTE — Progress Notes (Signed)
OT Cancellation Note  Patient Details Name: Wanda Robinson MRN: [id number] DOB: 1928-08-24   Cancelled Treatment:    Reason Eval/Treat Not Completed: Other (comment) When OT checked by, pt was walking in hallway with PT. Will try back at a different time.  Lennox Laity [phone] 05/20/2013, 11:26 AM

## 2013-05-21 NOTE — Progress Notes (Signed)
Attempted to give report to RN at [facility].  No one available to take report.

## 2013-05-21 NOTE — Discharge Summary (Signed)
Physician Discharge Summary  Patient ID: Wanda Robinson MRN: [id number] DOB/AGE: 77-Apr-1930 15 y.o.  Admit date: 05/16/2013 Discharge date: 05/21/2013  Admission Diagnoses: fever, failure to thrive  Discharge Diagnoses:  Active Problems:   Cellulitis   Fever   Discharged Condition: good  Hospital Course: the patient was admitted status post hernia repair. Patient was at home and had minimal by mouth intake. Secondary to weakness with some cellulitis to abdominal wall line for further evaluation. Patient was on antibiotics and encouraged to eat to Help with nutrition. Patient was working with physical therapy. Upon evaluation with physical therapy, they recommended  Skilled nursing facility placement.  The patient continue working with her nutritional intake.  Her by for changed to by mouth antibiotics while her cellulitis resolved. She's otherwise been stable for discharge N. Discharged to a skilled nursing facility.  Consults: internal medicine  Significant Diagnostic Studies: none  Treatments: IV hydration and antibiotics: Zosyn  Discharge Exam: Blood pressure 145/64, pulse 80, temperature 97.5 F (36.4 C), temperature source Oral, resp. rate 16, height 5\' 3"  (1.6 m), weight 141 lb 3.2 oz (64.048 kg), SpO2 95.00%. General appearance: alert and cooperative Cardio: regular rate and rhythm, S1, S2 normal, no murmur, click, rub or gallop GI: soft, non-tender; bowel sounds normal; no masses,  no organomegaly her G-tube wound is clean dry and intact, there's no cellulitis lateral to this.  Disposition: 01-Home or Self Care  Discharge Orders   Future Appointments Provider Department Dept Phone   05/28/2013 11:20 AM Axel Filler, MD [facility], [place] [phone]   Future Orders Complete By Expires   Discharge patient  As directed        Medication List    STOP taking these medications       labetalol 200 MG tablet  Commonly known as:  NORMODYNE     naproxen  sodium 220 MG tablet  Commonly known as:  ANAPROX      TAKE these medications       amLODipine 10 MG tablet  Commonly known as:  NORVASC  Take 10 mg by mouth daily with breakfast.     aspirin EC 81 MG tablet  Take 81 mg by mouth daily.     ferrous sulfate 325 (65 FE) MG tablet  Take 325 mg by mouth 2 (two) times daily.     hydrALAZINE 25 MG tablet  Commonly known as:  APRESOLINE  Take 1 tablet (25 mg total) by mouth every 8 (eight) hours.     hydrochlorothiazide 25 MG tablet  Commonly known as:  HYDRODIURIL  Take 25 mg by mouth daily with breakfast.     HYDROcodone-acetaminophen 7.5-325 mg/15 ml solution  Commonly known as:  HYCET  Take 15 mLs by mouth 4 (four) times daily as needed for moderate pain.     levothyroxine 150 MCG tablet  Commonly known as:  SYNTHROID, LEVOTHROID  Take 150 mcg by mouth daily before breakfast.     pravastatin 80 MG tablet  Commonly known as:  PRAVACHOL  Take 80 mg by mouth daily.     PROBIOTIC DAILY PO  Take 1 tablet by mouth daily.     Vitamin D-3 5000 UNITS Tabs  Take 5,000 Units by mouth daily.           Follow-up Information   Follow up with Lajean Saver, MD. Schedule an appointment as soon as possible for a visit in 2 weeks.   Specialty:  General Surgery   Contact information:  [address] [place] [address] [phone]       Signed: Marigene Ehlers., Jed Limerick 05/21/2013, 8:06 AM

## 2013-05-21 NOTE — Progress Notes (Signed)
Patient is set to discharge to [facility] today. Patient & daughter at bedside aware. Discharge packet given to RN, Irving Burton. Daughter to transport to facility - Jasmine December @ [place] aware.   Clinical Social Work Department CLINICAL SOCIAL WORK PLACEMENT NOTE 05/21/2013  Patient:  Wanda Robinson, Wanda Robinson  Account Number:  [id number] Admit date:  05/16/2013  Clinical Social Worker:  Orpah Greek  Date/time:  05/19/2013 03:11 PM  Clinical Social Work is seeking post-discharge placement for this patient at the following level of care:   SKILLED NURSING   (*CSW will update this form in Epic as items are completed)   05/19/2013  Patient/family provided with [facility] Department of Clinical Social Work's list of facilities offering this level of care within the geographic area requested by the patient (or if unable, by the patient's family).  05/19/2013  Patient/family informed of their freedom to choose among providers that offer the needed level of care, that participate in Medicare, Medicaid or managed care program needed by the patient, have an available bed and are willing to accept the patient.  05/19/2013  Patient/family informed of [facility]' ownership interest in [facility], as well as of the fact that they are under no obligation to receive care at this facility.  PASARR submitted to EDS on 05/19/2013 PASARR number received from EDS on 05/19/2013  FL2 transmitted to all facilities in geographic area requested by pt/family on  05/19/2013 FL2 transmitted to all facilities within larger geographic area on   Patient informed that his/her managed care company has contracts with or will negotiate with  certain facilities, including the following:     Patient/family informed of bed offers received:  05/19/2013 Patient chooses bed at [facility] Physician recommends and patient chooses bed at    Patient to be transferred to [facility] on  05/21/2013 Patient to be  transferred to facility by daughter's car  The following physician request were entered in Epic:   Additional Comments:   Unice Bailey, LCSW [facility] Clinical Social Worker cell #: [phone]

## 2013-05-22 ENCOUNTER — Telehealth (INDEPENDENT_AMBULATORY_CARE_PROVIDER_SITE_OTHER): Payer: Self-pay

## 2013-05-22 LAB — CULTURE, BLOOD (ROUTINE X 2)
Culture: NO GROWTH
Culture: NO GROWTH

## 2013-05-25 ENCOUNTER — Non-Acute Institutional Stay (SKILLED_NURSING_FACILITY): Payer: Medicare Other | Admitting: Adult Health

## 2013-05-25 DIAGNOSIS — L0291 Cutaneous abscess, unspecified: Secondary | ICD-10-CM

## 2013-05-25 DIAGNOSIS — L0390 Cellulitis, unspecified: Secondary | ICD-10-CM

## 2013-05-25 DIAGNOSIS — E785 Hyperlipidemia, unspecified: Secondary | ICD-10-CM

## 2013-05-25 DIAGNOSIS — D62 Acute posthemorrhagic anemia: Secondary | ICD-10-CM

## 2013-05-25 DIAGNOSIS — E039 Hypothyroidism, unspecified: Secondary | ICD-10-CM

## 2013-05-25 DIAGNOSIS — I10 Essential (primary) hypertension: Secondary | ICD-10-CM

## 2013-05-26 ENCOUNTER — Encounter (INDEPENDENT_AMBULATORY_CARE_PROVIDER_SITE_OTHER): Payer: Self-pay

## 2013-05-26 ENCOUNTER — Encounter (INDEPENDENT_AMBULATORY_CARE_PROVIDER_SITE_OTHER): Payer: Self-pay | Admitting: General Surgery

## 2013-05-26 ENCOUNTER — Ambulatory Visit (INDEPENDENT_AMBULATORY_CARE_PROVIDER_SITE_OTHER): Payer: Medicare Other | Admitting: General Surgery

## 2013-05-26 VITALS — BP 128/86 | HR 74 | Temp 97.0°F | Resp 16 | Wt 136.8 lb

## 2013-05-26 DIAGNOSIS — Z9889 Other specified postprocedural states: Secondary | ICD-10-CM

## 2013-05-26 MED ORDER — NYSTATIN 100000 UNIT/GM EX OINT: 1.0000 "application " | TOPICAL_OINTMENT | Freq: Two times a day (BID) | CUTANEOUS | Status: DC

## 2013-05-26 NOTE — Progress Notes (Signed)
Patient ID: Wanda Robinson, female   DOB: 12/22/1928, 79 y.o.   MRN: [id number] The patient is an 79 year old female status post hiatal hernia repair G-tube placement. Patient comes today secondary to a irritation around her G-tube.  Patient is noted. Diet. She is tolerating by mouth well. He'll have a little bit of pain and irritation around her G-tube which he states is getting better.   On exam: Her G-tube is in place.  There is a small amount of erythema around her G-tube has no leakage, and no infection Her wounds are clean dry and intact A 79 year old female status hiatal hernia repair and G-tube placement  1.  I will give the pt a prescription for nystatin cream to place around the G-tube site. 2. I would like the G-tube site to be dressed twice a day with dry gauze. 3. The patient has a followup appointment on 06/03/2013.

## 2013-05-27 ENCOUNTER — Non-Acute Institutional Stay (SKILLED_NURSING_FACILITY): Payer: Medicare Other | Admitting: Internal Medicine

## 2013-05-27 DIAGNOSIS — E039 Hypothyroidism, unspecified: Secondary | ICD-10-CM

## 2013-05-27 DIAGNOSIS — I10 Essential (primary) hypertension: Secondary | ICD-10-CM

## 2013-05-27 DIAGNOSIS — E785 Hyperlipidemia, unspecified: Secondary | ICD-10-CM

## 2013-05-27 DIAGNOSIS — D62 Acute posthemorrhagic anemia: Secondary | ICD-10-CM

## 2013-05-28 ENCOUNTER — Encounter (INDEPENDENT_AMBULATORY_CARE_PROVIDER_SITE_OTHER): Payer: Medicare Other | Admitting: General Surgery

## 2013-06-02 DIAGNOSIS — F0390 Unspecified dementia without behavioral disturbance: Secondary | ICD-10-CM | POA: Insufficient documentation

## 2013-06-03 ENCOUNTER — Encounter (INDEPENDENT_AMBULATORY_CARE_PROVIDER_SITE_OTHER): Payer: Self-pay | Admitting: General Surgery

## 2013-06-03 ENCOUNTER — Ambulatory Visit (INDEPENDENT_AMBULATORY_CARE_PROVIDER_SITE_OTHER): Payer: Medicare Other | Admitting: General Surgery

## 2013-06-03 VITALS — BP 132/70 | HR 80 | Temp 98.2°F | Resp 14 | Ht 62.0 in | Wt 130.0 lb

## 2013-06-03 DIAGNOSIS — Z9889 Other specified postprocedural states: Secondary | ICD-10-CM

## 2013-06-03 NOTE — Progress Notes (Signed)
Patient ID: Wanda Robinson, female   DOB: [birth year], 79 y.o.   MRN: [id number] Post op course The patient is currently home and away from her rehabilitation situation. She's been doing well at home. She continues with a full liquid diet at this time. Of note the patient has lost 6 pounds from last visit last week. According to her daughter she's had minimal appetite.  On Exam: The patient's laparoscopic wounds are clean dry and intact. Her G-tube is in place and a long erythematous around the gastrotomy site tube.   Assessment and Plan Patient is an 79 year old female status post hiatal hernia repair and G-tube placement. 1. We'll add soft pasta, scrambled eggs, and soft Fish to her diet. I would recommend continue with Ensure shakes/been to help with her nutrition. 2. We'll get the patient follow back up in 2 to 3 weeks.   Axel Filler, MD [facility] [place] General & Minimally Invasive Surgery Trauma & Emergency Surgery

## 2013-06-05 DIAGNOSIS — D62 Acute posthemorrhagic anemia: Secondary | ICD-10-CM | POA: Insufficient documentation

## 2013-06-05 NOTE — Progress Notes (Signed)
Patient ID: Wanda Robinson, female   DOB: [birth year], 79 y.o.   MRN: [id number]                         PROGRESS NOTE  DATE: 05/25/2013  FACILITY: Nursing Home Location: [facility]  LEVEL OF CARE: SNF (31)  Acute Visit  CHIEF COMPLAINT:  Follow-up Hospitalization  HISTORY OF PRESENT ILLNESS: This is an 79 year old female who has been admitted to [facility] on 05/21/13 from [facility]. She is S/P hernia repair. She had poor po intake and cellulitis to abdominal wall was treated and now resolved. She has been admitted for a short-term rehabilitation.  REASSESSMENT OF ONGOING PROBLEM(S):  HYPOTHYROIDISM: The hypothyroidism remains stable. No complications noted from the medications presently being used.  The patient denies fatigue or constipation.  10/14TSH 4.524  HTN: Pt 's HTN remains stable.  Denies CP, sob, DOE, pedal edema, headaches, dizziness or visual disturbances.  No complications from the medications currently being used.  Last BP : 133/64  ANEMIA: The anemia has been stable. The patient denies fatigue, melena or hematochezia. No complications from the medications currently being used. 11/14 hgb 13.7   PAST MEDICAL HISTORY : Reviewed.  No changes.  CURRENT MEDICATIONS: Reviewed per [facility]  REVIEW OF SYSTEMS:  GENERAL: no change in appetite, no fatigue, no weight changes, no fever, chills or weakness RESPIRATORY: no cough, SOB, DOE, wheezing, hemoptysis CARDIAC: no chest pain, edema or palpitations GI: no abdominal pain, diarrhea, constipation, heart burn, nausea or vomiting  PHYSICAL EXAMINATION  VS:  T98       P93      RR20      BP133/64     POX100 %     WT136.2 (Lb)  GENERAL: no acute distress, normal body habitus EYES: conjunctivae normal, sclerae normal, normal eye lids NECK: supple, trachea midline, no neck masses, no thyroid tenderness, no thyromegaly LYMPHATICS: no LAN in the neck, no supraclavicular LAN RESPIRATORY: breathing is  even & unlabored, BS CTAB CARDIAC: RRR, no murmur,no extra heart sounds, no edema GI: abdomen soft, normal BS, no masses, no tenderness, no hepatomegaly, no splenomegaly, Has scaly rashes on the LUQ, G-tube intact PSYCHIATRIC: the patient is alert & oriented to person, affect & behavior appropriate  LABS/RADIOLOGY: 05/19/13 sodium 136 potassium 3.9 glucose 116 BUN 18 creatinine 1.14 calcium 8.9 05/18/39 WBC 8.4 hemoglobin 13.7 hematocrit 39.4 03/28/13 TSH 4.524  ASSESSMENT/PLAN:  Hypertension - well controlled; continue Norvasc and Apresoline  Anemia - stable  Hypothyroidism - well controlled; continue Synthroid  Hyperlipidemia - continue Pravachol  Cellulitis - Resolved.   CPT CODE: [id number]

## 2013-06-22 NOTE — Telephone Encounter (Signed)
Erroneous encounter

## 2013-07-03 ENCOUNTER — Ambulatory Visit (INDEPENDENT_AMBULATORY_CARE_PROVIDER_SITE_OTHER): Payer: Medicare Other | Admitting: General Surgery

## 2013-07-03 ENCOUNTER — Encounter (INDEPENDENT_AMBULATORY_CARE_PROVIDER_SITE_OTHER): Payer: Self-pay | Admitting: General Surgery

## 2013-07-03 VITALS — BP 142/84 | HR 78 | Resp 16 | Ht 61.0 in | Wt 121.0 lb

## 2013-07-03 DIAGNOSIS — Z9889 Other specified postprocedural states: Secondary | ICD-10-CM

## 2013-07-03 NOTE — Progress Notes (Signed)
Patient ID: Wardell HeathMary M Maggio, female   DOB: August 27, 1928, 80 y.o.   MRN: [id number] Post op course The patient is an 80 year old female status post had a hernia repair with G-tube placement. The patient's continue with her diet as previously discussed. She continues with approximately 1 inch per day. The patient has lost approximately 16 pounds since her last visit a month ago.  On Exam: G-tube in place clean dry and intact  Assessment and Plan A 80 year old female status post had a hernia repair with G-tube placement. 1. Patient can upper diet to soft vegetables, chicken, and similar diet as previously ordered. So we'll offer name breads or chunky meat like steak 2.we'll have patient follow back up in 3-4 weeks.   Axel FillerArmando Callista Hoh, MD [facility] General & Minimally Invasive Surgery Trauma & Emergency Surgery

## 2013-07-03 NOTE — Patient Instructions (Signed)
EATING AFTER YOUR ESOPHAGEAL SURGERY °(Stomach Fundoplication, Hiatal Hernia repair, Achalasia surgery, etc) ° °After your esophageal surgery, expect some sticking with swallowing over the next 1-2 months.   ° °If food sticks when you eat, it is called "dysphagia".  This is due to swelling around your esophagus at the wrap & hiatal diaphragm repair.  It will gradually ease off over the next few months.  To help you through this temporary phase, we start you out on a pureed (blenderized) diet. ° °Your first meal in the hospital was thin liquids.  You should have been given a pureed diet by the time you left the hospital.  We ask patients to stay on a pureed diet for the first 2-3 weeks to avoid anything getting "stuck" near your recent surgery.  Don't be alarmed if your ability to swallow doesn't progress according to this plan.  Everyone is different and some diets can advance more or less quickly.   ° ° °Some BASIC RULES to follow are: °· Maintain an upright position whenever eating or drinking. °· Take small bites - just a teaspoon size bite at a time. °· Eat slowly.  It may also help to eat only one food at a time. °· Consider nibbling through smaller, more frequent meals & avoid the urge to eat BIG meals °· Do not push through feelings of fullness, nausea, or bloatedness °· Do not mix solid foods and liquids in the same mouthful °· Try not to "wash foods down" with large gulps of liquids. °Avoid carbonated (bubbly/fizzy) drinks.  Understand that it will be hard to burp and belch at first.  This gradually improves with time.  Expect to be more gassy/flatulent/bloated initially.  Walking will help you work through that.  Maalox/Gas-X can help as well. °· Eat in a relaxed atmosphere & minimize distractions. °· Avoid talking while eating.   °· Do not use straws. °· Following each meal, sit in an upright position (90 degree angle) for 60 to 90 minutes.  Going for a short walk can help as well °· If food does stick,  don't panic.  Try to relax and let the food pass on its own.  Sipping WARM LIQUID such as strong hot black tea can also help slide it down. ° ° °Be gradual in changes & use common sense: ° °-If you easily tolerating a certain "level" of foods, advance to the next level gradually °-If you are having trouble swallowing a particular food, then avoid it.   °-If food is sticking when you advance your diet, go back to thinner previous diet (the lower LEVEL) for 1-2 days. ° °LEVEL 1 = PUREED DIET ° °Do for the first 2 WEEKS AFTER SURGERY ° °-Foods in this group are pureed or blenderized to a smooth, mashed potato-like consistency.  °-If necessary, the pureed foods can keep their shape with the addition of a thickening agent.   °-Meat should be pureed to a smooth, pasty consistency.  Hot broth or gravy may be added to the pureed meat, approximately 1 oz. of liquid per 3 oz. serving of meat. °-CAUTION:  If any foods do not puree into a smooth consistency, swallowing will be more difficult.  (For example, nuts or seeds sometimes do not blend well.) ° °Hot Foods Cold Foods  °Pureed scrambled eggs and cheese Pureed cottage cheese  °Baby cereals Thickened juices and nectars  °Thinned cooked cereals (no lumps) Thickened milk or eggnog  °Pureed French toast or pancakes Ensure  °Mashed   potatoes Ice cream  °Pureed parsley, au gratin, scalloped potatoes, candied sweet potatoes Fruit or Italian ice, sherbet  °Pureed buttered or alfredo noodles Plain yogurt  °Pureed vegetables (no corn or peas) Instant breakfast  °Pureed soups and creamed soups Smooth pudding, mousse, custard  °Pureed scalloped apples Whipped gelatin  °Gravies Sugar, syrup, honey, jelly  °Sauces, cheese, tomato, barbecue, white, creamed Cream  °Any baby food Creamer  °Alcohol in moderation (not beer or champagne) Margarine  °Coffee or tea Mayonnaise  ° Ketchup, mustard  ° Apple sauce  ° °SAMPLE MENU:  PUREED DIET °Breakfast Lunch Dinner  °· Orange juice, 1/2  cup °· Cream of wheat, 1/2 cup · Pineapple juice, 1/2 cup · Pureed turkey, barley soup, 3/4 cup °· Pureed Hawaiian chicken, 3 oz  °· Scrambled eggs, mashed or blended with cheese, 1/2 cup °· Tea or coffee, 1 cup  °· Whole milk, 1 cup  °· Non-dairy creamer, 2 Tbsp. · Mashed potatoes, 1/2 cup °· Pureed cooled broccoli, 1/2 cup °· Apple sauce, 1/2 cup °· Coffee or tea · Mashed potatoes, 1/2 cup °· Pureed spinach, 1/2 cup °· Frozen yogurt, 1/2 cup °· Tea or coffee  ° ° ° ° °LEVEL 2 = SOFT DIET ° °After your first 2 weeks, you can advance to a soft diet.   °Keep on this diet until everything goes down easily. ° °Hot Foods Cold Foods  °White fish Cottage cheese  °Stuffed fish Junior baby fruit  °Baby food meals Semi thickened juices  °Minced soft cooked, scrambled, poached eggs nectars  °Souffle & omelets Ripe mashed bananas  °Cooked cereals Canned fruit, pineapple sauce, milk  °potatoes Milkshake  °Buttered or Alfredo noodles Custard  °Cooked cooled vegetable Puddings, including tapioca  °Sherbet Yogurt  °Vegetable soup or alphabet soup Fruit ice, Italian ice  °Gravies Whipped gelatin  °Sugar, syrup, honey, jelly Junior baby desserts  °Sauces:  Cheese, creamed, barbecue, tomato, white Cream  °Coffee or tea Margarine  ° °SAMPLE MENU:  LEVEL 2 °Breakfast Lunch Dinner  °· Orange juice, 1/2 cup °· Oatmeal, 1/2 cup °· Scrambled eggs with cheese, 1/2 cup °· Decaffeinated tea, 1 cup °· Whole milk, 1 cup °· Non-dairy creamer, 2 Tbsp · Pineapple juice, 1/2 cup °· Minced beef, 3 oz °· Gravy, 2 Tbsp °· Mashed potatoes, 1/2 cup °· Minced fresh broccoli, 1/2 cup °· Applesauce, 1/2 cup °· Coffee, 1 cup · Turkey, barley soup, 3/4 cup °· Minced Hawaiian chicken, 3 oz °· Mashed potatoes, 1/2 cup °· Cooked spinach, 1/2 cup °· Frozen yogurt, 1/2 cup °· Non-dairy creamer, 2 Tbsp  ° ° ° ° °LEVEL 3 = CHOPPED DIET ° °-After all the foods in level 2 (soft diet) are passing through well you should advance up to more chopped foods.  °-It is still  important to cut these foods into small pieces and eat slowly. ° °Hot Foods Cold Foods  °Poultry Cottage cheese  °Chopped Swedish meatballs Yogurt  °Meat salads (ground or flaked meat) Milk  °Flaked fish (tuna) Milkshakes  °Poached or scrambled eggs Soft, cold, dry cereal  °Souffles and omelets Fruit juices or nectars  °Cooked cereals Chopped canned fruit  °Chopped French toast or pancakes Canned fruit cocktail  °Noodles or pasta (no rice) Pudding, mousse, custard  °Cooked vegetables (no frozen peas, corn, or mixed vegetables) Green salad  °Canned small sweet peas Ice cream  °Creamed soup or vegetable soup Fruit ice, Italian ice  °Pureed vegetable soup or alphabet soup Non-dairy creamer  °Ground scalloped   apples Margarine  °Gravies Mayonnaise  °Sauces:  Cheese, creamed, barbecue, tomato, white Ketchup  °Coffee or tea Mustard  ° °SAMPLE MENU:  LEVEL 3 °Breakfast Lunch Dinner  °· Orange juice, 1/2 cup °· Oatmeal, 1/2 cup °· Scrambled eggs with cheese, 1/2 cup °· Decaffeinated tea, 1 cup °· Whole milk, 1 cup °· Non-dairy creamer, 2 Tbsp °· Ketchup, 1 Tbsp °· Margarine, 1 tsp °· Salt, 1/4 tsp °· Sugar, 2 tsp · Pineapple juice, 1/2 cup °· Ground beef, 3 oz °· Gravy, 2 Tbsp °· Mashed potatoes, 1/2 cup °· Cooked spinach, 1/2 cup °· Applesauce, 1/2 cup °· Decaffeinated coffee °· Whole milk °· Non-dairy creamer, 2 Tbsp °· Margarine, 1 tsp °· Salt, 1/4 tsp · Pureed turkey, barley soup, 3/4 cup °· Barbecue chicken, 3 oz °· Mashed potatoes, 1/2 cup °· Ground fresh broccoli, 1/2 cup °· Frozen yogurt, 1/2 cup °· Decaffeinated tea, 1 cup °· Non-dairy creamer, 2 Tbsp °· Margarine, 1 tsp °· Salt, 1/4 tsp °· Sugar, 1 tsp  ° ° °LEVEL 4:  REGULAR FOODS ° °-Foods in this group are soft, moist, regularly textured foods.   °-This level includes meat and breads, which tend to be the hardest things to swallow.   °-Eat very slowly, chew well and continue to avoid carbonated drinks. °-most people are at this level in 4-6 weeks ° °Hot Foods  Cold Foods  °Baked fish or skinned Soft cheeses - cottage cheese  °Souffles and omelets Cream cheese  °Eggs Yogurt  °Stuffed shells Milk  °Spaghetti with meat sauce Milkshakes  °Cooked cereal Cold dry cereals (no nuts, dried fruit, coconut)  °French toast or pancakes Crackers  °Buttered toast Fruit juices or nectars  °Noodles or pasta (no rice) Canned fruit  °Potatoes (all types) Ripe bananas  °Soft, cooked vegetables (no corn, lima, or baked beans) Peeled, ripe, fresh fruit  °Creamed soups or vegetable soup Cakes (no nuts, dried fruit, coconut)  °Canned chicken noodle soup Plain doughnuts  °Gravies Ice cream  °Bacon dressing Pudding, mousse, custard  °Sauces:  Cheese, creamed, barbecue, tomato, white Fruit ice, Italian ice, sherbet  °Decaffeinated tea or coffee Whipped gelatin  °Pork chops Regular gelatin  ° Canned fruited gelatin molds  ° Sugar, syrup, honey, jam, jelly  ° Cream  ° Non-dairy  ° Margarine  ° Oil  ° Mayonnaise  ° Ketchup  ° Mustard  ° ° °If you have any questions please call our office at [facility]: [phone]. ° °

## 2013-07-20 ENCOUNTER — Encounter (INDEPENDENT_AMBULATORY_CARE_PROVIDER_SITE_OTHER): Payer: Self-pay | Admitting: General Surgery

## 2013-07-20 ENCOUNTER — Ambulatory Visit (INDEPENDENT_AMBULATORY_CARE_PROVIDER_SITE_OTHER): Payer: Medicare Other | Admitting: General Surgery

## 2013-07-20 VITALS — BP 140/74 | HR 80 | Temp 97.4°F | Resp 18 | Wt 122.0 lb

## 2013-07-20 DIAGNOSIS — Z9889 Other specified postprocedural states: Secondary | ICD-10-CM

## 2013-07-20 NOTE — Progress Notes (Signed)
Patient ID: Wanda Robinson, female   DOB: June 10, 1929, 80 y.o.   MRN: [id number] Post op course The patient has been doing well since her last clinic visit. She has had decreased appetite however she is eating pretty much ad lib.  Her tube is in place without any issues.  On Exam: G-tube in place  Assessment and Plan 80 year old female status post hiatal hernia repair NG tube placement 1. I felt comfortable at this time the patient he needs a brace. I discussed with her that she can finally have meats and breads. I discussed with her to have these finely chopped and chewed well 2. The patient follow back in a 1-1/2-2 months. At that time we can consider removing the G-tube.   Axel FillerArmando Sanyia Dini, MD [facility] General & Minimally Invasive Surgery Trauma & Emergency Surgery

## 2013-07-31 ENCOUNTER — Encounter: Payer: Self-pay | Admitting: Internal Medicine

## 2013-07-31 NOTE — Progress Notes (Signed)
Patient ID: Wanda Robinson, female   DOB: 12/24/1928, 80 y.o.   MRN: [id number]               HISTORY & PHYSICAL  DATE: 05/27/2013     FACILITY: [facility]  LEVEL OF CARE: SNF (31)  ALLERGIES:  Allergies  Allergen Reactions  . Codeine Phosphate Nausea Only    CHIEF COMPLAINT:  Manage hyperlipidemia, hypothyroidism, and hypertension.    HISTORY OF PRESENT ILLNESS:  The patient is an 80 year-old, Caucasian female who was hospitalized for cellulitis of abdominal wall after hernia repair.  After hospitalization, she is admitted to this facility for short-term rehabilitation.    HYPERLIPIDEMIA: No complications from the medications presently being used. Last fasting lipid panel showed :    Not available.    HYPOTHYROIDISM: The hypothyroidism remains stable. No complications noted from the medications presently being used.  The patient denies fatigue or constipation.  Last TSH:  Not available.    HTN: Pt 's HTN remains stable.  Denies CP, sob, DOE, pedal edema, headaches, dizziness or visual disturbances.  No complications from the medications currently being used.  Last BP :   164/82.    PAST MEDICAL HISTORY :  Past Medical History  Diagnosis Date  . MI (myocardial infarction)   . Hypertension   . Hyperlipidemia   . Ulcer   . Colon polyps     removed with colonoscopy  . Aortic stenosis   . CAD (coronary artery disease)   . Gastric outlet obstruction     10'14- hospital stay of several days([facility])  . Hiatal hernia   . Dementia 05-05-13    short term memory issues"no dx"  . Hypothyroidism   . Chronic kidney disease 05-05-13    renal dysfunction- has improved from 3'11  . Anemia     past hx. 2011  . Renal artery stenosis     Previous stenting of renal arteries bilaterally    PAST SURGICAL HISTORY: Past Surgical History  Procedure Laterality Date  . Coronary artery bypass graft  2002    Dr Dorris Fetch  . Cholecystectomy      open  . Carotid  endarterectomy Left 2011    Dr. Darrick Penna  . Hiatal hernia repair N/A 05/12/2013    Procedure: LAPAROSCOPIC REPAIR OF HIATAL HERNIA, ;  Surgeon: Axel Filler, MD;  Location: [facility];  Service: General;  Laterality: N/A;  . Gastrostomy N/A 05/12/2013    Procedure: G TUBE PLACEMENT;  Surgeon: Axel Filler, MD;  Location: [facility];  Service: General;  Laterality: N/A;  . Insertion of mesh N/A 05/12/2013    Procedure: INSERTION OF MESH;  Surgeon: Axel Filler, MD;  Location: [facility];  Service: General;  Laterality: N/A;    SOCIAL HISTORY:  reports that she quit smoking about 24 years ago. She has never used smokeless tobacco. She reports that she does not drink alcohol or use illicit drugs.  FAMILY HISTORY:  Family History  Problem Relation Age of Onset  . Heart disease Father     CAD    CURRENT MEDICATIONS: Reviewed per [facility]  REVIEW OF SYSTEMS:  See HPI otherwise 14 point ROS is negative.  PHYSICAL EXAMINATION  VS:  T 97.8       P 83      RR 20      BP 164/82      POX%         GENERAL: no acute distress, normal body habitus EYES: conjunctivae  normal, sclerae normal, normal eye lids MOUTH/THROAT: lips without lesions,no lesions in the mouth,tongue is without lesions,uvula elevates in midline NECK: supple, trachea midline, no neck masses, no thyroid tenderness, no thyromegaly LYMPHATICS: no LAN in the neck, no supraclavicular LAN RESPIRATORY: breathing is even & unlabored, BS CTAB CARDIAC: RRR, no murmur,no extra heart sounds, no edema GI:  ABDOMEN: abdomen soft, normal BS, no masses, no tenderness  LIVER/SPLEEN: no hepatomegaly, no splenomegaly MUSCULOSKELETAL: HEAD: normal to inspection & palpation BACK: no kyphosis, scoliosis or spinal processes tenderness EXTREMITIES: LEFT UPPER EXTREMITY: full range of motion, normal strength & tone RIGHT UPPER EXTREMITY:  full range of motion, normal strength & tone LEFT LOWER EXTREMITY:  full range of motion, normal strength &  tone RIGHT LOWER EXTREMITY:  full range of motion, normal strength & tone PSYCHIATRIC: the patient is alert & oriented to person, affect & behavior appropriate  LABS/RADIOLOGY:  Labs reviewed: Basic Metabolic Panel:  Recent Labs  56/21/3009/05/01 0552  05/12/13 0220 05/13/13 0510  05/17/13 0411 05/18/13 0407 05/19/13 0654  NA  --   < > 139 137  < > 137 137 136  K  --   < > 3.8 3.7  < > 3.3* 3.4* 3.9  CL  --   < > 103 104  < > 96 93* 96  CO2  --   < > 25 25  < > 31 32 29  GLUCOSE  --   < > 127* 139*  < > 104* 127* 116*  BUN  --   < > 21 14  < > 10 11 18   CREATININE  --   < > 0.97 0.86  < > 0.90 1.08 1.14*  CALCIUM  --   < > 9.8 8.6  < > 9.0 9.3 8.9  MG 1.9  --   --  2.5  --   --   --   --   PHOS  --   --   --  2.5  --   --   --   --   < > = values in this interval not displayed. Liver Function Tests:  Recent Labs  05/13/13 0510 05/16/13 1430 05/17/13 0411  AST 32 20 21  ALT 21 16 15   ALKPHOS 50 70 67  BILITOT 0.7 0.9 0.9  PROT 5.6* 6.1 5.6*  ALBUMIN 2.9* 2.6* 2.4*    Recent Labs  03/27/13 2320 05/16/13 1430  LIPASE 44 15   CBC:  Recent Labs  03/27/13 2320  05/12/13 0220 05/13/13 0510 05/16/13 1430 05/17/13 0411  WBC 6.7  < > 6.7 8.9 10.8* 8.4  NEUTROABS 5.1  --  5.1  --  8.9*  --   HGB 15.5*  < > 14.9 14.2 14.9 13.7  HCT 43.6  < > 42.7 41.4 41.5 39.4  MCV 86.5  < > 88.4 89.0 86.8 87.2  PLT 210  < > 198 164 240 222  < > = values in this interval not displayed.  Cardiac Enzymes:  Recent Labs  03/28/13 0552 03/28/13 0937 05/12/13 0220  TROPONINI <0.30 <0.30 <0.30   CBG:  Recent Labs  03/28/13 1706 03/28/13 2131 03/29/13 0758  GLUCAP 98 92 83   ASSESSMENT/PLAN:  Hypertension.  Blood pressure elevated.  We will review a log.      Hypothyroidism.  Continue levothyroxine.    Hyperlipidemia.  Continue Pravachol.    Anemia.  Patient is currently on iron.  Hemoglobins have been normal.  We will recheck.  Check CBC and BMP.    [facility] Metrics:    Aspirin 81 mg q.d.  Former smoker.    I have reviewed patient's medical records received at admission/from hospitalization.  CPT CODE: 29562

## 2013-09-22 ENCOUNTER — Encounter (INDEPENDENT_AMBULATORY_CARE_PROVIDER_SITE_OTHER): Payer: Self-pay | Admitting: General Surgery

## 2013-09-24 ENCOUNTER — Ambulatory Visit (INDEPENDENT_AMBULATORY_CARE_PROVIDER_SITE_OTHER): Payer: Medicare Other | Admitting: General Surgery

## 2014-01-15 ENCOUNTER — Telehealth: Payer: Self-pay | Admitting: Cardiology

## 2014-01-15 NOTE — Telephone Encounter (Signed)
Returned call to patient's daughter Wanda ArnoldStacy She stated yesterday her mother drove to her sister's house and when she walked in her house she just stood there and did not not why she was there.Stated she was very confused.Stated she did not have any chest pain,no slurred speech,no headache,no weakness.Stated she gets confused at times but this was much worse.Advised to see PCP.Will make Dr.Jordan aware.

## 2014-01-15 NOTE — Telephone Encounter (Signed)
New problem   Pt is very confused and not being herself. Pt's daughter need to speak to nurse about this and want to know if she need to bring pt in office.

## 2014-03-18 ENCOUNTER — Ambulatory Visit (INDEPENDENT_AMBULATORY_CARE_PROVIDER_SITE_OTHER): Payer: Medicare Other | Admitting: Cardiology

## 2014-03-18 ENCOUNTER — Encounter: Payer: Self-pay | Admitting: Cardiology

## 2014-03-18 VITALS — BP 116/64 | HR 66 | Ht 61.0 in | Wt 129.0 lb

## 2014-03-18 DIAGNOSIS — R001 Bradycardia, unspecified: Secondary | ICD-10-CM

## 2014-03-18 DIAGNOSIS — I10 Essential (primary) hypertension: Secondary | ICD-10-CM

## 2014-03-18 DIAGNOSIS — I2510 Atherosclerotic heart disease of native coronary artery without angina pectoris: Secondary | ICD-10-CM

## 2014-03-18 NOTE — Patient Instructions (Signed)
Continue your current therapy  I will see you in one year   

## 2014-03-19 NOTE — Progress Notes (Signed)
Wanda Robinson Date of Birth: [birth year] Medical Record #[id number]  History of Present Illness: Wanda Robinson is seen for follow up today. She has a remote history of myocardial infarction. In 2002 she was admitted with unstable angina and found to have three-vessel obstructive coronary disease. On 01/09/2001 she underwent coronary bypass surgery x4 with an LIMA graft to the LAD, saphenous vein graft to the ramus intermediate, saphenous vein graft to the OM 1, and saphenous vein graft to the PDA. This was done by Dr. Dorris Fetch. She has had stenting of bilateral renal arteries in 2007. In 2011 showed a left carotid endarterectomy. She has a history of cardiac murmur. In 2014 she has evaluation with Echo that showed AV sclerosis with mild to moderate AI. EF was normal. Myoview study at that time showed anteroapical infarct with mild peri-infarct ischemia.  She had surgery last year for a large hiatal hernia.  From a cardiac standpoint she denies any current symptoms of chest pain, shortness of breath, or palpitations. She did have a vagal episode after dental work and almost passed out. She has had no other dizziness.  Current Outpatient Prescriptions on File Prior to Visit  Medication Sig Dispense Refill  . amLODipine (NORVASC) 10 MG tablet Take 10 mg by mouth daily with breakfast.       . aspirin EC 81 MG tablet Take 81 mg by mouth daily.      . Cholecalciferol (VITAMIN D-3) 5000 UNITS TABS Take 5,000 Units by mouth daily.      Marland Kitchen donepezil (ARICEPT) 5 MG tablet Take 5 mg by mouth at bedtime.      . ferrous sulfate 325 (65 FE) MG tablet Take 325 mg by mouth 2 (two) times daily.      . hydrochlorothiazide (HYDRODIURIL) 25 MG tablet Take 25 mg by mouth daily with breakfast.       . levothyroxine (SYNTHROID, LEVOTHROID) 150 MCG tablet Take 150 mcg by mouth daily before breakfast.      . pravastatin (PRAVACHOL) 80 MG tablet Take 80 mg by mouth daily.      . Probiotic Product (PROBIOTIC DAILY PO) Take 1  tablet by mouth daily.       No current facility-administered medications on file prior to visit.    Allergies  Allergen Reactions  . Codeine Phosphate Nausea Only    Past Medical History  Diagnosis Date  . MI (myocardial infarction)   . Hypertension   . Hyperlipidemia   . Ulcer   . Colon polyps     removed with colonoscopy  . Aortic stenosis   . CAD (coronary artery disease)   . Gastric outlet obstruction     10'14- hospital stay of several days([facility])  . Hiatal hernia   . Dementia 05-05-13    short term memory issues"no dx"  . Hypothyroidism   . Chronic kidney disease 05-05-13    renal dysfunction- has improved from 3'11  . Anemia     past hx. 2011  . Renal artery stenosis     Previous stenting of renal arteries bilaterally    Past Surgical History  Procedure Laterality Date  . Coronary artery bypass graft  2002    Dr Dorris Fetch  . Cholecystectomy      open  . Carotid endarterectomy Left 2011    Dr. Darrick Penna  . Hiatal hernia repair N/A 05/12/2013    Procedure: LAPAROSCOPIC REPAIR OF HIATAL HERNIA, ;  Surgeon: Axel Filler, MD;  Location: [facility];  Service: General;  Laterality: N/A;  . Gastrostomy N/A 05/12/2013    Procedure: G TUBE PLACEMENT;  Surgeon: Axel FillerArmando Ramirez, MD;  Location: [facility];  Service: General;  Laterality: N/A;  . Insertion of mesh N/A 05/12/2013    Procedure: INSERTION OF MESH;  Surgeon: Axel FillerArmando Ramirez, MD;  Location: [facility];  Service: General;  Laterality: N/A;    History  Smoking status  . Former Smoker  . Quit date: 05/05/1989  Smokeless tobacco  . Never Used    History  Alcohol Use No    Family History  Problem Relation Age of Onset  . Heart disease Father     CAD    Review of Systems: As noted in HPI. All other systems were reviewed and are negative.  Physical Exam: BP 116/64  Pulse 66  Ht 5\' 1"  (1.549 m)  Wt 129 lb (58.514 kg)  BMI 24.39 kg/m2 She is a pleasant elderly white female in no acute distress. HEENT:  Normocephalic, atraumatic. Pupils around and reactive. Sclera clear. Oropharynx is clear. Neck is without JVD, adenopathy, thyromegaly, or bruits. Carotid upstrokes are normal. Lungs: Clear Cardiovascular: Regular rate and rhythm. Normal S1 and S2. There is a harsh grade 2/6 systolic murmur heard best at the left upper sternal border rating to the left lower sternal border. There is no diastolic murmur or S3. Abdomen: Soft and nontender. No hepatosplenomegaly or masses. Extremities are without cyanosis or edema. Pulses 2+ and symmetric. Alert and oriented x3. Cranial nerves II through XII are intact. Skin: Warm and dry  LABORATORY DATA: ECG 03/09/14 demonstrates sinus rhythm with Wenkebach. There is rightward axis. Poor R wave progression.  Assessment / Plan: 1. Coronary disease with remote CABG in 2002. No clear anginal symptoms. Patient is sedentary. Myoview study last year showed old scar without significant ischemia. Will continue medical therapy.  2. Mild to moderate AI. Asymptomatic.   3. PAD status post bilateral renal artery stenting.  4. Status post left carotid endarterectomy.  5. History of gastric outlet obstruction secondary to hiatal hernia. S/p surgical repair.   6. Chronic kidney disease.  7. Hypertension  8. Hyperlipidemia.

## 2014-07-04 ENCOUNTER — Emergency Department (HOSPITAL_BASED_OUTPATIENT_CLINIC_OR_DEPARTMENT_OTHER): Payer: Medicare Other

## 2014-07-04 ENCOUNTER — Encounter (HOSPITAL_BASED_OUTPATIENT_CLINIC_OR_DEPARTMENT_OTHER): Payer: Self-pay | Admitting: *Deleted

## 2014-07-04 ENCOUNTER — Emergency Department (HOSPITAL_BASED_OUTPATIENT_CLINIC_OR_DEPARTMENT_OTHER)
Admission: EM | Admit: 2014-07-04 | Discharge: 2014-07-04 | Disposition: A | Discharge status: Home / Self Care | Payer: Medicare Other | Attending: Emergency Medicine | Admitting: Emergency Medicine

## 2014-07-04 DIAGNOSIS — Z87891 Personal history of nicotine dependence: Secondary | ICD-10-CM | POA: Diagnosis not present

## 2014-07-04 DIAGNOSIS — I2510 Atherosclerotic heart disease of native coronary artery without angina pectoris: Secondary | ICD-10-CM | POA: Diagnosis not present

## 2014-07-04 DIAGNOSIS — R011 Cardiac murmur, unspecified: Secondary | ICD-10-CM | POA: Diagnosis not present

## 2014-07-04 DIAGNOSIS — M545 Low back pain: Secondary | ICD-10-CM | POA: Diagnosis present

## 2014-07-04 DIAGNOSIS — I129 Hypertensive chronic kidney disease with stage 1 through stage 4 chronic kidney disease, or unspecified chronic kidney disease: Secondary | ICD-10-CM | POA: Insufficient documentation

## 2014-07-04 DIAGNOSIS — N189 Chronic kidney disease, unspecified: Secondary | ICD-10-CM | POA: Diagnosis not present

## 2014-07-04 DIAGNOSIS — Z7982 Long term (current) use of aspirin: Secondary | ICD-10-CM | POA: Diagnosis not present

## 2014-07-04 DIAGNOSIS — E785 Hyperlipidemia, unspecified: Secondary | ICD-10-CM | POA: Insufficient documentation

## 2014-07-04 DIAGNOSIS — E039 Hypothyroidism, unspecified: Secondary | ICD-10-CM | POA: Insufficient documentation

## 2014-07-04 DIAGNOSIS — I252 Old myocardial infarction: Secondary | ICD-10-CM | POA: Insufficient documentation

## 2014-07-04 DIAGNOSIS — Z79899 Other long term (current) drug therapy: Secondary | ICD-10-CM | POA: Insufficient documentation

## 2014-07-04 DIAGNOSIS — M5136 Other intervertebral disc degeneration, lumbar region: Secondary | ICD-10-CM | POA: Insufficient documentation

## 2014-07-04 DIAGNOSIS — D649 Anemia, unspecified: Secondary | ICD-10-CM | POA: Diagnosis not present

## 2014-07-04 DIAGNOSIS — Z86010 Personal history of colonic polyps: Secondary | ICD-10-CM | POA: Diagnosis not present

## 2014-07-04 DIAGNOSIS — R52 Pain, unspecified: Secondary | ICD-10-CM

## 2014-07-04 DIAGNOSIS — F0390 Unspecified dementia without behavioral disturbance: Secondary | ICD-10-CM | POA: Diagnosis not present

## 2014-07-04 LAB — URINALYSIS, ROUTINE W REFLEX MICROSCOPIC
BILIRUBIN URINE: NEGATIVE
Glucose, UA: NEGATIVE mg/dL
Hgb urine dipstick: NEGATIVE
Ketones, ur: 15 mg/dL — AB
Leukocytes, UA: NEGATIVE
Nitrite: NEGATIVE
Protein, ur: 30 mg/dL — AB
Specific Gravity, Urine: 1.021 (ref 1.005–1.030)
Urobilinogen, UA: 1 mg/dL (ref 0.0–1.0)
pH: 6 (ref 5.0–8.0)

## 2014-07-04 LAB — URINE MICROSCOPIC-ADD ON

## 2014-07-04 MED ORDER — HYDROCODONE-ACETAMINOPHEN 5-325 MG PO TABS: 1.0000 | ORAL_TABLET | ORAL | Status: DC | PRN

## 2014-07-04 MED ORDER — MORPHINE SULFATE 2 MG/ML IJ SOLN
2.0000 mg | INTRAMUSCULAR | Status: DC | PRN
  Administered 2014-07-04: 2 mg via INTRAVENOUS
  Filled 2014-07-04: qty 1

## 2014-07-04 MED ORDER — ONDANSETRON HCL 4 MG PO TABS: 4.0000 mg | ORAL_TABLET | Freq: Four times a day (QID) | ORAL | Status: DC

## 2014-07-04 MED ORDER — ONDANSETRON HCL 4 MG/2ML IJ SOLN
4.0000 mg | Freq: Once | INTRAMUSCULAR | Status: AC
  Administered 2014-07-04: 4 mg via INTRAVENOUS
  Filled 2014-07-04: qty 2

## 2014-07-04 NOTE — Discharge Instructions (Signed)
Back Pain, Adult °Back pain is very common. The pain often gets better over time. The cause of back pain is usually not dangerous. Most people can learn to manage their back pain on their own.  °HOME CARE  °· Stay active. Start with short walks on flat ground if you can. Try to walk farther each day. °· Do not sit, drive, or stand in one place for more than 30 minutes. Do not stay in bed. °· Do not avoid exercise or work. Activity can help your back heal faster. °· Be careful when you bend or lift an object. Bend at your knees, keep the object close to you, and do not twist. °· Sleep on a firm mattress. Lie on your side, and bend your knees. If you lie on your back, put a pillow under your knees. °· Only take medicines as told by your doctor. °· Put ice on the injured area. °¨ Put ice in a plastic bag. °¨ Place a towel between your skin and the bag. °¨ Leave the ice on for 15-20 minutes, 03-04 times a day for the first 2 to 3 days. After that, you can switch between ice and heat packs. °· Ask your doctor about back exercises or massage. °· Avoid feeling anxious or stressed. Find good ways to deal with stress, such as exercise. °GET HELP RIGHT AWAY IF:  °· Your pain does not go away with rest or medicine. °· Your pain does not go away in 1 week. °· You have new problems. °· You do not feel well. °· The pain spreads into your legs. °· You cannot control when you poop (bowel movement) or pee (urinate). °· Your arms or legs feel weak or lose feeling (numbness). °· You feel sick to your stomach (nauseous) or throw up (vomit). °· You have belly (abdominal) pain. °· You feel like you may pass out (faint). °MAKE SURE YOU:  °· Understand these instructions. °· Will watch your condition. °· Will get help right away if you are not doing well or get worse. °Document Released: 11/21/2007 Document Revised: 08/27/2011 Document Reviewed: 10/06/2013 °Exit[employer]® Patient Information ©2015 [employer]. This information is not intended  to replace advice given to you by your health care provider. Make sure you discuss any questions you have with your health care provider. ° °

## 2014-07-04 NOTE — ED Provider Notes (Signed)
CSN: [id number]     Arrival date & time 07/04/14  1347 History   First MD Initiated Contact with Patient 07/04/14 1412     Chief Complaint  Patient presents with  . Back Pain    HPI Patient presents to the emergency room with complaints of low back pain for the last 4 days. Patient's daughter states that the day prior they have done a lot of shopping and walking. The patient does have a history of back trouble that intermittently flares up. The pain is located in the midportion of the lower back. The pain does not radiate. It is sharp and increases with movement. Eyes any trouble with abdominal pain. No trouble with vomiting or diarrhea. No dysuria. Past Medical History  Diagnosis Date  . MI (myocardial infarction)   . Hypertension   . Hyperlipidemia   . Ulcer   . Colon polyps     removed with colonoscopy  . Aortic stenosis   . CAD (coronary artery disease)   . Gastric outlet obstruction     10'14- hospital stay of several days([facility])  . Hiatal hernia   . Dementia 05-05-13    short term memory issues"no dx"  . Hypothyroidism   . Chronic kidney disease 05-05-13    renal dysfunction- has improved from 3'11  . Anemia     past hx. 2011  . Renal artery stenosis     Previous stenting of renal arteries bilaterally   Past Surgical History  Procedure Laterality Date  . Coronary artery bypass graft  2002    Dr Dorris Fetch  . Cholecystectomy      open  . Carotid endarterectomy Left 2011    Dr. Darrick Penna  . Hiatal hernia repair N/A 05/12/2013    Procedure: LAPAROSCOPIC REPAIR OF HIATAL HERNIA, ;  Surgeon: Axel Filler, MD;  Location: [facility];  Service: General;  Laterality: N/A;  . Gastrostomy N/A 05/12/2013    Procedure: G TUBE PLACEMENT;  Surgeon: Axel Filler, MD;  Location: [facility];  Service: General;  Laterality: N/A;  . Insertion of mesh N/A 05/12/2013    Procedure: INSERTION OF MESH;  Surgeon: Axel Filler, MD;  Location: [facility];  Service: General;  Laterality: N/A;    Family History  Problem Relation Age of Onset  . Heart disease Father     CAD   History  Substance Use Topics  . Smoking status: Former Smoker    Quit date: 05/05/1989  . Smokeless tobacco: Never Used  . Alcohol Use: No   OB History    No data available     Review of Systems  All other systems reviewed and are negative.     Allergies  Codeine phosphate  Home Medications   Prior to Admission medications   Medication Sig Start Date End Date Taking? Authorizing Provider  amLODipine (NORVASC) 10 MG tablet Take 10 mg by mouth daily with breakfast.     Historical Provider, MD  aspirin EC 81 MG tablet Take 81 mg by mouth daily.    Historical Provider, MD  Cholecalciferol (VITAMIN D-3) 5000 UNITS TABS Take 5,000 Units by mouth daily.    Historical Provider, MD  donepezil (ARICEPT) 5 MG tablet Take 5 mg by mouth at bedtime.    Historical Provider, MD  ferrous sulfate 325 (65 FE) MG tablet Take 325 mg by mouth 2 (two) times daily.    Historical Provider, MD  hydrochlorothiazide (HYDRODIURIL) 25 MG tablet Take 25 mg by mouth daily with breakfast.  Historical Provider, MD  HYDROcodone-acetaminophen (NORCO/VICODIN) 5-325 MG per tablet Take 1-2 tablets by mouth every 4 (four) hours as needed. 07/04/14   Linwood Dibbles, MD  levothyroxine (SYNTHROID, LEVOTHROID) 150 MCG tablet Take 150 mcg by mouth daily before breakfast.    Historical Provider, MD  pravastatin (PRAVACHOL) 80 MG tablet Take 80 mg by mouth daily.    Historical Provider, MD  Probiotic Product (PROBIOTIC DAILY PO) Take 1 tablet by mouth daily.    Historical Provider, MD   BP 128/58 mmHg  Pulse 61  Temp(Src) 97.8 F (36.6 C) (Oral)  Resp 18  Ht  (1.6 m)  Wt 129 lb (58.514 kg)  BMI 22.86 kg/m2  SpO2 100% Physical Exam  Constitutional: No distress.  elderly  HENT:  Head: Normocephalic and atraumatic.  Right Ear: External ear normal.  Left Ear: External ear normal.  Eyes: Conjunctivae are normal. Right eye  exhibits no discharge. Left eye exhibits no discharge. No scleral icterus.  Neck: Neck supple. No tracheal deviation present.  Cardiovascular: Normal rate, regular rhythm and intact distal pulses.   Murmur heard. Systolic murmur  Pulmonary/Chest: Effort normal and breath sounds normal. No stridor. No respiratory distress. She has no wheezes. She has no rales.  Abdominal: Soft. Bowel sounds are normal. She exhibits no distension, no abdominal bruit, no pulsatile midline mass and no mass. There is no tenderness. There is no rebound and no guarding.  Musculoskeletal: She exhibits no edema.       Lumbar back: She exhibits tenderness and bony tenderness. She exhibits no swelling, no edema and no deformity.  Tenderness to palpation proximal lumbar region  Neurological: She is alert. She has normal strength. No cranial nerve deficit (no facial droop, extraocular movements intact, no slurred speech) or sensory deficit. She exhibits normal muscle tone. She displays no seizure activity. Coordination normal.  Skin: Skin is warm and dry. No rash noted.  Psychiatric: She has a normal mood and affect.  Nursing note and vitals reviewed.   ED Course  Procedures (including critical care time) Labs Review Labs Reviewed  URINALYSIS, ROUTINE W REFLEX MICROSCOPIC - Abnormal; Notable for the following:    Ketones, ur 15 (*)    Protein, ur 30 (*)    All other components within normal limits  URINE MICROSCOPIC-ADD ON - Abnormal; Notable for the following:    Squamous Epithelial / LPF FEW (*)    Crystals CA OXALATE CRYSTALS (*)    All other components within normal limits    Imaging Review Dg Lumbar Spine Complete  07/04/2014   CLINICAL DATA:  Central lower back pain for 4 days.  No injury.  EXAM: LUMBAR SPINE - COMPLETE 4+ VIEW  COMPARISON:  CT examination 05/16/2013  FINDINGS: There are visceral artery stents. Mild scoliosis in the lumbar spine. Aorta and iliac arteries are calcified. Oval-shaped  calcification or density in the mid upper abdomen is indeterminate. Degenerative facet disease in the lower lumbar spine. Again noted is aneurysmal dilatation of the abdominal aorta measuring roughly 3 cm. Grade 1 anterolisthesis at L4-L5. Disc space narrowing at L5-S1. Degenerative endplate changes at L2-L1. Vertebral body heights are maintained.  IMPRESSION: Multilevel degenerative changes in lumbar spine. No acute bone abnormality.  Atherosclerotic disease in the abdominal aorta with an aneurysm measuring at least 3 cm.   Electronically Signed   By: Richarda Overlie M.D.   On: 07/04/2014 14:55   Medications  morphine 2 MG/ML injection 2 mg (2 mg Intravenous Given 07/04/14 1455)  ondansetron (  ZOFRAN) injection 4 mg (not administered)     MDM   Final diagnoses:  Pain  Spondylosis of lumbar region without myelopathy or radiculopathy   Pain improved with treatment in the ED.  No numbness or weakness.  Discussed aneurysm finding which was noted on prior CT scans in the record.  Dc home with pain meds.  Follow up with PCP.  Monitor for constipation.   Linwood DibblesJon Rafik Koppel, MD 07/04/14 1537

## 2014-07-04 NOTE — ED Notes (Signed)
Patient was feeling some nausea and MD wrote a prescription for Zofran. Patient instructed to eat something light before taking her pain medication. Patient was sipping ginger ale and eating crackers

## 2014-07-04 NOTE — ED Notes (Signed)
C/o low back pain x 4 days. Denies injury. Denies urinary sx

## 2014-07-04 NOTE — ED Notes (Signed)
Patient resting in room for a few minutes to ensure nausea subsided, nausea when ambulating

## 2014-07-06 ENCOUNTER — Encounter: Payer: Self-pay | Admitting: Vascular Surgery

## 2014-07-06 ENCOUNTER — Other Ambulatory Visit: Payer: Self-pay

## 2014-07-06 DIAGNOSIS — I714 Abdominal aortic aneurysm, without rupture, unspecified: Secondary | ICD-10-CM

## 2014-08-04 ENCOUNTER — Encounter: Payer: Self-pay | Admitting: Vascular Surgery

## 2014-08-05 ENCOUNTER — Ambulatory Visit (HOSPITAL_COMMUNITY)
Admission: RE | Admit: 2014-08-05 | Discharge: 2014-08-05 | Disposition: A | Discharge status: Home / Self Care | Payer: Medicare Other | Source: Ambulatory Visit | Attending: Vascular Surgery | Admitting: Vascular Surgery

## 2014-08-05 ENCOUNTER — Encounter: Payer: Self-pay | Admitting: Vascular Surgery

## 2014-08-05 ENCOUNTER — Ambulatory Visit (INDEPENDENT_AMBULATORY_CARE_PROVIDER_SITE_OTHER): Payer: Medicare Other | Admitting: Vascular Surgery

## 2014-08-05 ENCOUNTER — Other Ambulatory Visit: Payer: Self-pay

## 2014-08-05 ENCOUNTER — Ambulatory Visit (INDEPENDENT_AMBULATORY_CARE_PROVIDER_SITE_OTHER)
Admission: RE | Admit: 2014-08-05 | Discharge: 2014-08-05 | Disposition: A | Discharge status: Home / Self Care | Payer: Medicare Other | Source: Ambulatory Visit | Attending: Vascular Surgery | Admitting: Vascular Surgery

## 2014-08-05 VITALS — BP 152/59 | HR 64 | Ht 63.0 in | Wt 129.0 lb

## 2014-08-05 DIAGNOSIS — I714 Abdominal aortic aneurysm, without rupture, unspecified: Secondary | ICD-10-CM

## 2014-08-05 DIAGNOSIS — I6523 Occlusion and stenosis of bilateral carotid arteries: Secondary | ICD-10-CM

## 2014-08-05 NOTE — Progress Notes (Signed)
[facility] HISTORY AND PHYSICAL   History of Present Illness:  Patient is a 81 y.o. year old female who presents for evaluation of abdominal aortic aneurysm as well as follow-up of carotid artery stenosis bilaterally. The patient had a left carotid endarterectomy in 2011. She denies any abdominal or back pain. She denies family history of aneurysm. The aneurysm was incidentally discovered on a CT scan. This has been seen on prior CT scans as well. It was 3 cm in diameter..  Other medical problems include coronary artery disease, hypertension, hyperlipidemia, aortic stenosis, mild dementia although she does live independently, COPD all of which are currently stable. She is a former smoker. The patient also complained of a headache that occurs sometimes when turning her neck clinically. She denies any syncopal or presyncopal episodes.  Past Medical History  Diagnosis Date  . MI (myocardial infarction)   . Hypertension   . Hyperlipidemia   . Ulcer   . Colon polyps     removed with colonoscopy  . Aortic stenosis   . CAD (coronary artery disease)   . Gastric outlet obstruction     10'14- hospital stay of several days([facility])  . Hiatal hernia   . Dementia 05-05-13    short term memory issues"no dx"  . Hypothyroidism   . Chronic kidney disease 05-05-13    renal dysfunction- has improved from 3'11  . Anemia     past hx. 2011  . Renal artery stenosis     Previous stenting of renal arteries bilaterally  . Back pain   . COPD (chronic obstructive pulmonary disease)   . Carotid artery occlusion     Past Surgical History  Procedure Laterality Date  . Coronary artery bypass graft  2002    Dr Dorris Fetch  . Cholecystectomy      open  . Carotid endarterectomy Left 2011    Dr. Darrick Penna  . Hiatal hernia repair N/A 05/12/2013    Procedure: LAPAROSCOPIC REPAIR OF HIATAL HERNIA, ;  Surgeon: Axel Filler, MD;  Location: [facility];  Service: General;  Laterality: N/A;  .  Gastrostomy N/A 05/12/2013    Procedure: G TUBE PLACEMENT;  Surgeon: Axel Filler, MD;  Location: [facility];  Service: General;  Laterality: N/A;  . Insertion of mesh N/A 05/12/2013    Procedure: INSERTION OF MESH;  Surgeon: Axel Filler, MD;  Location: [facility];  Service: General;  Laterality: N/A;    Social History History  Substance Use Topics  . Smoking status: Former Smoker    Quit date: 05/05/1989  . Smokeless tobacco: Never Used  . Alcohol Use: No    Family History Family History  Problem Relation Age of Onset  . Heart disease Father     CAD  . Heart attack Father   . Cancer Sister   . Heart disease Brother   . Heart attack Brother   . Cancer Son   . Hypertension Son     Allergies  Allergies  Allergen Reactions  . Codeine Phosphate Nausea Only     Current Outpatient Prescriptions  Medication Sig Dispense Refill  . amLODipine (NORVASC) 10 MG tablet Take 10 mg by mouth daily with breakfast.     . aspirin EC 81 MG tablet Take 81 mg by mouth daily.    . Cholecalciferol (VITAMIN D-3) 5000 UNITS TABS Take 5,000 Units by mouth daily.    Marland Kitchen donepezil (ARICEPT) 5 MG tablet Take 5 mg by mouth at bedtime.    . ferrous  sulfate 324 (65 FE) MG TBEC TAKE ONE TABLET BY MOUTH TWICE DAILY    . ferrous sulfate 325 (65 FE) MG tablet Take 325 mg by mouth 2 (two) times daily.    . hydrochlorothiazide (HYDRODIURIL) 25 MG tablet Take 25 mg by mouth daily with breakfast.     . hydrochlorothiazide (HYDRODIURIL) 25 MG tablet Take 25 mg by mouth.    Marland Kitchen HYDROcodone-acetaminophen (NORCO/VICODIN) 5-325 MG per tablet Take 1-2 tablets by mouth every 4 (four) hours as needed. 16 tablet 0  . levothyroxine (SYNTHROID, LEVOTHROID) 150 MCG tablet Take 150 mcg by mouth daily before breakfast.    . ondansetron (ZOFRAN) 4 MG tablet Take 1 tablet (4 mg total) by mouth every 6 (six) hours. 12 tablet 0  . pravastatin (PRAVACHOL) 80 MG tablet Take 80 mg by mouth daily.    . Probiotic Product (PROBIOTIC  DAILY PO) Take 1 tablet by mouth daily.     No current facility-administered medications for this visit.    ROS:   General:  No weight loss, Fever, chills  HEENT: No recent headaches, no nasal bleeding, no visual changes, no sore throat  Neurologic: No dizziness, blackouts, seizures. No recent symptoms of stroke or mini- stroke. No recent episodes of slurred speech, or temporary blindness.  Cardiac: No recent episodes of chest pain/pressure, no shortness of breath at rest.  No shortness of breath with exertion.  Denies history of atrial fibrillation or irregular heartbeat  Vascular: No history of rest pain in feet.  No history of claudication.  No history of non-healing ulcer, No history of DVT   Pulmonary: No home oxygen, no productive cough, no hemoptysis,  No asthma or wheezing  Musculoskeletal:   Arthritis,  Low back pain,   Joint pain  Hematologic:No history of hypercoagulable state.  No history of easy bleeding.  No history of anemia  Gastrointestinal: No hematochezia or melena,  No gastroesophageal reflux, no trouble swallowing  Urinary:  chronic Kidney disease,  on HD -  MWF or  TTHS,  Burning with urination,  Frequent urination,  Difficulty urinating;   Skin: No rashes  Psychological: No history of anxiety,  No history of depression   Physical Examination  Filed Vitals:   08/05/14 1122  BP: 152/59  Pulse: 64  Height:  (1.6 m)  Weight: 129 lb (58.514 kg)  SpO2: 100%    Body mass index is 22.86 kg/(m^2).  General:  Alert and oriented, no acute distress HEENT: Normal Neck: No bruit or JVD Pulmonary: Clear to auscultation bilaterally Cardiac: Regular Rate and Rhythm with 3/6 systolic murmur Abdomen: Soft, non-tender, non-distended, no mass Skin: No rash Extremity Pulses:  2+ radial, brachial right side, absent left brachial and radial pulse, 2+ femoral pulses bilaterally  Musculoskeletal: No deformity or edema  Neurologic:  Upper and lower extremity motor 5/5 and symmetric  DATA:  Patient had an aortic ultrasound today which I reviewed and interpreted. Aortic diameter was 2.8 cm. Iliacs were normal diameter.  The patient also had a bilateral carotid duplex scan today for follow-up of her carotid endarterectomy as well as these headache episodes. I reviewed and interpreted this study. Right internal carotid artery was less than 40% left 40-60%. Right brachial pressure was 180 left 168 tip of the left vertebral flow both of which were suggestive of left subclavian artery stenosis  ASSESSMENT:  #1 abdominal aortic aneurysm 2.8 cm diameter. She will need six-month follow-up ultrasound for  this. If it ever reaches diameter 5-1/2 cm we would consider repair at that point. #2 bilateral carotid stenosis no hemodynamically significant recurrence currently. #3 left subclavian stenosis clinically asymptomatic patient advised of symptoms to watch for with left arm fatigue or episodes of dizziness.   PLAN:  See above  Fabienne Brunsharles Lillian Tigges, MD [facility] [place] Office: [phone] Pager: [phone]

## 2014-12-13 ENCOUNTER — Other Ambulatory Visit: Payer: Self-pay

## 2015-02-02 ENCOUNTER — Encounter: Payer: Self-pay | Admitting: Vascular Surgery

## 2015-02-03 ENCOUNTER — Encounter: Payer: Self-pay | Admitting: Vascular Surgery

## 2015-02-03 ENCOUNTER — Ambulatory Visit (HOSPITAL_COMMUNITY)
Admission: RE | Admit: 2015-02-03 | Discharge: 2015-02-03 | Disposition: A | Discharge status: Home / Self Care | Payer: Medicare Other | Source: Ambulatory Visit | Attending: Vascular Surgery | Admitting: Vascular Surgery

## 2015-02-03 ENCOUNTER — Other Ambulatory Visit: Payer: Self-pay | Admitting: Vascular Surgery

## 2015-02-03 ENCOUNTER — Ambulatory Visit (INDEPENDENT_AMBULATORY_CARE_PROVIDER_SITE_OTHER): Payer: Medicare Other | Admitting: Vascular Surgery

## 2015-02-03 ENCOUNTER — Ambulatory Visit (INDEPENDENT_AMBULATORY_CARE_PROVIDER_SITE_OTHER)
Admission: RE | Admit: 2015-02-03 | Discharge: 2015-02-03 | Disposition: A | Discharge status: Home / Self Care | Payer: Medicare Other | Source: Ambulatory Visit | Attending: Vascular Surgery | Admitting: Vascular Surgery

## 2015-02-03 VITALS — BP 147/7 | HR 59 | Temp 97.7°F | Ht 63.0 in | Wt 133.4 lb

## 2015-02-03 DIAGNOSIS — I6523 Occlusion and stenosis of bilateral carotid arteries: Secondary | ICD-10-CM | POA: Insufficient documentation

## 2015-02-03 DIAGNOSIS — Z48812 Encounter for surgical aftercare following surgery on the circulatory system: Secondary | ICD-10-CM | POA: Insufficient documentation

## 2015-02-03 DIAGNOSIS — I714 Abdominal aortic aneurysm, without rupture, unspecified: Secondary | ICD-10-CM

## 2015-02-03 NOTE — Progress Notes (Signed)
[facility] [place] HISTORY AND PHYSICAL    History of Present Illness:  Patient is a 81 y.o. year old female who presents for evaluation of abdominal aortic aneurysm as well as follow-up of carotid artery stenosis bilaterally. The patient had a left carotid endarterectomy in 2011. She denies any abdominal or back pain. She denies family history of aneurysm. The aneurysm was incidentally discovered on a CT scan. This has been seen on prior CT scans as well. It was 3 cm in diameter..  Other medical problems include coronary artery disease, hypertension, hyperlipidemia, aortic stenosis, mild dementia although she does live independently, COPD all of which are currently stable. She is a former smoker. The patient also complained of a headache that occurs sometimes when turning her neck clinically. This is unchanged from her previous office visit. The headache is transient lasting less than 10 seconds. She denies any syncopal or presyncopal episodes.     Past Medical History   Diagnosis  Date   .  MI (myocardial infarction)     .  Hypertension     .  Hyperlipidemia     .  Ulcer     .  Colon polyps         removed with colonoscopy   .  Aortic stenosis     .  CAD (coronary artery disease)     .  Gastric outlet obstruction         10'14- hospital stay of several days([facility])   .  Hiatal hernia     .  Dementia  05-05-13       short term memory issues"no dx"   .  Hypothyroidism     .  Chronic kidney disease  05-05-13       renal dysfunction- has improved from 3'11   .  Anemia         past hx. 2011   .  Renal artery stenosis         Previous stenting of renal arteries bilaterally   .  Back pain     .  COPD (chronic obstructive pulmonary disease)     .  Carotid artery occlusion         Past Surgical History   Procedure  Laterality  Date   .  Coronary artery bypass graft    2002       Dr Dorris Fetch   .  Cholecystectomy           open   .  Carotid endarterectomy  Left   2011       Dr. Darrick Penna   .  Hiatal hernia repair  N/A  05/12/2013       Procedure: LAPAROSCOPIC REPAIR OF HIATAL HERNIA, ;  Surgeon: Axel Filler, MD;  Location: [facility];  Service: General;  Laterality: N/A;   .  Gastrostomy  N/A  05/12/2013       Procedure: G TUBE PLACEMENT;  Surgeon: Axel Filler, MD;  Location: [facility];  Service: General;  Laterality: N/A;   .  Insertion of mesh  N/A  05/12/2013       Procedure: INSERTION OF MESH;  Surgeon: Axel Filler, MD;  Location: [facility];  Service: General;  Laterality: N/A;     Social History History   Substance Use Topics   .  Smoking status:  Former Smoker       Quit date:  05/05/1989   .  Smokeless tobacco:  Never Used   .  Alcohol Use:  No     Family History Family History   Problem  Relation  Age of Onset   .  Heart disease  Father         CAD   .  Heart attack  Father     .  Cancer  Sister     .  Heart disease  Brother     .  Heart attack  Brother     .  Cancer  Son     .  Hypertension  Son       Allergies    Allergies   Allergen  Reactions   .  Codeine Phosphate  Nausea Only        Current Outpatient Prescriptions   Medication  Sig  Dispense  Refill   .  amLODipine (NORVASC) 10 MG tablet  Take 10 mg by mouth daily with breakfast.        .  aspirin EC 81 MG tablet  Take 81 mg by mouth daily.       .  Cholecalciferol (VITAMIN D-3) 5000 UNITS TABS  Take 5,000 Units by mouth daily.       Marland Kitchen  donepezil (ARICEPT) 5 MG tablet  Take 5 mg by mouth at bedtime.       .  ferrous sulfate 324 (65 FE) MG TBEC  TAKE ONE TABLET BY MOUTH TWICE DAILY       .  ferrous sulfate 325 (65 FE) MG tablet  Take 325 mg by mouth 2 (two) times daily.       .  hydrochlorothiazide (HYDRODIURIL) 25 MG tablet  Take 25 mg by mouth daily with breakfast.        .  hydrochlorothiazide (HYDRODIURIL) 25 MG tablet  Take 25 mg by mouth.       Marland Kitchen  HYDROcodone-acetaminophen (NORCO/VICODIN) 5-325 MG per tablet  Take 1-2 tablets by mouth every 4 (four) hours  as needed.  16 tablet  0   .  levothyroxine (SYNTHROID, LEVOTHROID) 150 MCG tablet  Take 150 mcg by mouth daily before breakfast.       .  ondansetron (ZOFRAN) 4 MG tablet  Take 1 tablet (4 mg total) by mouth every 6 (six) hours.  12 tablet  0   .  pravastatin (PRAVACHOL) 80 MG tablet  Take 80 mg by mouth daily.       .  Probiotic Product (PROBIOTIC DAILY PO)  Take 1 tablet by mouth daily.          No current facility-administered medications for this visit.     ROS:    General:  No weight loss, Fever, chills  HEENT: No recent headaches, no nasal bleeding, no visual changes, no sore throat  Neurologic: No dizziness, blackouts, seizures. No recent symptoms of stroke or mini- stroke. No recent episodes of slurred speech, or temporary blindness.  Cardiac: No recent episodes of chest pain/pressure, no shortness of breath at rest.  No shortness of breath with exertion.  Denies history of atrial fibrillation or irregular heartbeat  Vascular: No history of rest pain in feet.  No history of claudication.  No history of non-healing ulcer, No history of DVT    Pulmonary: No home oxygen, no productive cough, no hemoptysis,  No asthma or wheezing  Musculoskeletal:  [ ]  Arthritis, [ ]  Low back pain,  [ ]  Joint pain  Hematologic:No history of hypercoagulable state.  No history of easy bleeding.  No history of anemia  Gastrointestinal:  No hematochezia or melena,  No gastroesophageal reflux, no trouble swallowing  Urinary: [ ]  chronic Kidney disease, [ ]  on HD - [ ]  MWF or [ ]  TTHS, [ ]  Burning with urination, [ ]  Frequent urination, [ ]  Difficulty urinating;    Skin: No rashes  Psychological: No history of anxiety,  No history of depression   Physical Examination     Filed Vitals:   02/03/15 1051 02/03/15 1054  BP: 166/77 147/7  Pulse: 59   Temp: 97.7 F (36.5 C)   TempSrc: Oral   Height: 5\' 3"  (1.6 m)   Weight: 133 lb 6.4 oz (60.51 kg)   SpO2: 98%     General:  Alert and  oriented, no acute distress HEENT: Normal Neck: No bruit or JVD Pulmonary: Clear to auscultation bilaterally Cardiac: Regular Rate and Rhythm with 3/6 systolic murmur Abdomen: Soft, non-tender, non-distended, no mass Skin: No rash Extremity Pulses:  2+ radial, brachial right side, absent left brachial and radial pulse, 2+ femoral pulses bilaterally   Musculoskeletal: No deformity or edema     Neurologic: Upper and lower extremity motor 5/5 and symmetric  DATA:  Patient had an aortic ultrasound today which I reviewed and interpreted. Aortic diameter was 3 cm. Iliacs were normal diameter.  She also had bilateral carotid duplex exam. Right internal carotid artery less than 40% left 40-60%. She also had a 20 mm gradient between her left brachial and right brachial pressure suggestive of left subclavian artery stenosis.  The patient also had a bilateral carotid duplex scan today for follow-up of her carotid endarterectomy as well as these headache episodes. I reviewed and interpreted this study. Right internal carotid artery was less than 40% left 40-60%. Right brachial pressure was 180 left 168 tip of the left vertebral flow both of which were suggestive of left subclavian artery stenosis  ASSESSMENT:  #1 abdominal aortic aneurysm 3 cm diameter. She will need one year follow-up ultrasound for this. If it ever reaches diameter 5-1/2 cm we would consider repair at that point. #2 bilateral carotid stenosis no hemodynamically significant recurrence currently follow-up carotid duplex scan in 1 year. #3 left subclavian stenosis clinically asymptomatic patient advised of symptoms to watch for with left arm fatigue or episodes of dizziness. I am not sure that these episodes of left-sided headache) she turns her neck to the left are related. We will keep an eye on this.   PLAN:  See above  Fabienne Bruns, MD [facility] [place] Office: [phone] Pager: [phone]

## 2015-02-04 NOTE — Addendum Note (Signed)
Addended by: Sharee Pimple on: 02/04/2015 05:18 PM   Modules accepted: Orders

## 2015-06-02 ENCOUNTER — Emergency Department (HOSPITAL_BASED_OUTPATIENT_CLINIC_OR_DEPARTMENT_OTHER): Payer: Medicare Other

## 2015-06-02 ENCOUNTER — Emergency Department (HOSPITAL_BASED_OUTPATIENT_CLINIC_OR_DEPARTMENT_OTHER)
Admission: EM | Admit: 2015-06-02 | Discharge: 2015-06-02 | Disposition: A | Discharge status: Home / Self Care | Payer: Medicare Other | Attending: Emergency Medicine | Admitting: Emergency Medicine

## 2015-06-02 ENCOUNTER — Other Ambulatory Visit: Payer: Self-pay

## 2015-06-02 ENCOUNTER — Encounter (HOSPITAL_BASED_OUTPATIENT_CLINIC_OR_DEPARTMENT_OTHER): Payer: Self-pay | Admitting: *Deleted

## 2015-06-02 DIAGNOSIS — Z951 Presence of aortocoronary bypass graft: Secondary | ICD-10-CM | POA: Insufficient documentation

## 2015-06-02 DIAGNOSIS — E039 Hypothyroidism, unspecified: Secondary | ICD-10-CM | POA: Insufficient documentation

## 2015-06-02 DIAGNOSIS — Z7982 Long term (current) use of aspirin: Secondary | ICD-10-CM | POA: Insufficient documentation

## 2015-06-02 DIAGNOSIS — F0390 Unspecified dementia without behavioral disturbance: Secondary | ICD-10-CM | POA: Insufficient documentation

## 2015-06-02 DIAGNOSIS — I2510 Atherosclerotic heart disease of native coronary artery without angina pectoris: Secondary | ICD-10-CM | POA: Diagnosis not present

## 2015-06-02 DIAGNOSIS — D649 Anemia, unspecified: Secondary | ICD-10-CM | POA: Diagnosis not present

## 2015-06-02 DIAGNOSIS — I129 Hypertensive chronic kidney disease with stage 1 through stage 4 chronic kidney disease, or unspecified chronic kidney disease: Secondary | ICD-10-CM | POA: Insufficient documentation

## 2015-06-02 DIAGNOSIS — I252 Old myocardial infarction: Secondary | ICD-10-CM | POA: Insufficient documentation

## 2015-06-02 DIAGNOSIS — M25559 Pain in unspecified hip: Secondary | ICD-10-CM

## 2015-06-02 DIAGNOSIS — J449 Chronic obstructive pulmonary disease, unspecified: Secondary | ICD-10-CM | POA: Diagnosis not present

## 2015-06-02 DIAGNOSIS — M25551 Pain in right hip: Secondary | ICD-10-CM | POA: Insufficient documentation

## 2015-06-02 DIAGNOSIS — N189 Chronic kidney disease, unspecified: Secondary | ICD-10-CM | POA: Diagnosis not present

## 2015-06-02 DIAGNOSIS — Z87891 Personal history of nicotine dependence: Secondary | ICD-10-CM | POA: Insufficient documentation

## 2015-06-02 DIAGNOSIS — Z872 Personal history of diseases of the skin and subcutaneous tissue: Secondary | ICD-10-CM | POA: Insufficient documentation

## 2015-06-02 DIAGNOSIS — Z79899 Other long term (current) drug therapy: Secondary | ICD-10-CM | POA: Diagnosis not present

## 2015-06-02 DIAGNOSIS — Z86010 Personal history of colonic polyps: Secondary | ICD-10-CM | POA: Insufficient documentation

## 2015-06-02 LAB — URINALYSIS, ROUTINE W REFLEX MICROSCOPIC
Bilirubin Urine: NEGATIVE
Glucose, UA: NEGATIVE mg/dL
Hgb urine dipstick: NEGATIVE
KETONES UR: NEGATIVE mg/dL
LEUKOCYTES UA: NEGATIVE
NITRITE: NEGATIVE
PROTEIN: NEGATIVE mg/dL
Specific Gravity, Urine: 1.012 (ref 1.005–1.030)
pH: 7.5 (ref 5.0–8.0)

## 2015-06-02 LAB — BASIC METABOLIC PANEL
ANION GAP: 11 (ref 5–15)
BUN: 22 mg/dL — ABNORMAL HIGH (ref 6–20)
CO2: 26 mmol/L (ref 22–32)
Calcium: 10.1 mg/dL (ref 8.9–10.3)
Chloride: 103 mmol/L (ref 101–111)
Creatinine, Ser: 0.85 mg/dL (ref 0.44–1.00)
GFR calc Af Amer: >60 mL/min (ref >60)
GLUCOSE: 101 mg/dL — AB (ref 65–99)
POTASSIUM: 3.7 mmol/L (ref 3.5–5.1)
Sodium: 140 mmol/L (ref 135–145)

## 2015-06-02 LAB — CBC WITH DIFFERENTIAL/PLATELET
BASOS ABS: 0 10*3/uL (ref 0.0–0.1)
Basophils Relative: 0 %
Eosinophils Absolute: 0.1 10*3/uL (ref 0.0–0.7)
Eosinophils Relative: 2 %
HEMATOCRIT: 43.7 % (ref 36.0–46.0)
HEMOGLOBIN: 14.9 g/dL (ref 12.0–15.0)
LYMPHS PCT: 29 %
Lymphs Abs: 1.3 10*3/uL (ref 0.7–4.0)
MCH: 29.2 pg (ref 26.0–34.0)
MCHC: 34.1 g/dL (ref 30.0–36.0)
MCV: 85.5 fL (ref 78.0–100.0)
Monocytes Absolute: 0.4 10*3/uL (ref 0.1–1.0)
Monocytes Relative: 8 %
NEUTROS ABS: 2.7 10*3/uL (ref 1.7–7.7)
Neutrophils Relative %: 61 %
Platelets: 205 10*3/uL (ref 150–400)
RBC: 5.11 MIL/uL (ref 3.87–5.11)
RDW: 13.4 % (ref 11.5–15.5)
WBC: 4.5 10*3/uL (ref 4.0–10.5)

## 2015-06-02 LAB — TROPONIN I
TROPONIN I: 0.04 ng/mL — AB (ref <0.031)
Troponin I: 0.05 ng/mL — ABNORMAL HIGH (ref <0.031)

## 2015-06-02 NOTE — ED Notes (Signed)
Right hip pain. Difficulty walking for over a week.

## 2015-06-02 NOTE — ED Notes (Signed)
PA at bedside.

## 2015-06-02 NOTE — ED Notes (Signed)
Walked Pt. To restroom with no difficulty.  Pt. Able to walk with slight limp.  No resp. Distress noted in Pt.

## 2015-06-02 NOTE — ED Notes (Signed)
Patient transported to xray via stretcher.

## 2015-06-02 NOTE — ED Provider Notes (Signed)
CSN: [id number]     Arrival date & time 06/02/15  1207 History   First MD Initiated Contact with Patient 06/02/15 1220     Chief Complaint  Patient presents with  . Hip Pain   HPI Wanda Robinson is a 81 y.o. F PMH significant for MI, HTN, HLD, dementia presenting with a one week history of difficulty walking and right hip pain per daughter, present at bedside. Her daughter states the pain has been intermittent. The patient denies any reason for being in the ED. She denies fevers, chills, CP, abdominal pain, N/V/D, difficulty walking, and pain anywhere.   Past Medical History  Diagnosis Date  . MI (myocardial infarction) (HCC)   . Hypertension   . Hyperlipidemia   . Ulcer   . Colon polyps     removed with colonoscopy  . Aortic stenosis   . CAD (coronary artery disease)   . Gastric outlet obstruction     10'14- hospital stay of several days([facility])  . Hiatal hernia   . Dementia 05-05-13    short term memory issues"no dx"  . Hypothyroidism   . Chronic kidney disease 05-05-13    renal dysfunction- has improved from 3'11  . Anemia     past hx. 2011  . Renal artery stenosis (HCC)     Previous stenting of renal arteries bilaterally  . Back pain   . COPD (chronic obstructive pulmonary disease) (HCC)   . Carotid artery occlusion    Past Surgical History  Procedure Laterality Date  . Coronary artery bypass graft  2002    Dr Dorris Fetch  . Cholecystectomy      open  . Carotid endarterectomy Left 2011    Dr. Darrick Penna  . Hiatal hernia repair N/A 05/12/2013    Procedure: LAPAROSCOPIC REPAIR OF HIATAL HERNIA, ;  Surgeon: Axel Filler, MD;  Location: [facility];  Service: General;  Laterality: N/A;  . Gastrostomy N/A 05/12/2013    Procedure: G TUBE PLACEMENT;  Surgeon: Axel Filler, MD;  Location: [facility];  Service: General;  Laterality: N/A;  . Insertion of mesh N/A 05/12/2013    Procedure: INSERTION OF MESH;  Surgeon: Axel Filler, MD;  Location: [facility];  Service: General;   Laterality: N/A;   Family History  Problem Relation Age of Onset  . Heart disease Father     CAD  . Heart attack Father   . Hypertension Father   . Cancer Sister   . Heart disease Brother     before age 24  . Heart attack Brother   . Hypertension Brother   . Cancer Son   . Hypertension Son   . Cancer Mother    Social History  Substance Use Topics  . Smoking status: Former Smoker    Quit date: 05/05/1989  . Smokeless tobacco: Never Used  . Alcohol Use: No   OB History    No data available     Review of Systems  Ten systems are reviewed and are negative for acute change except as noted in the HPI   Allergies  Codeine phosphate  Home Medications   Prior to Admission medications   Medication Sig Start Date End Date Taking? Authorizing Provider  amLODipine (NORVASC) 10 MG tablet Take 10 mg by mouth daily with breakfast.     Historical Provider, MD  aspirin EC 81 MG tablet Take 81 mg by mouth daily.    Historical Provider, MD  Cholecalciferol (VITAMIN D-3) 5000 UNITS TABS Take 5,000 Units by mouth daily.  Historical Provider, MD  donepezil (ARICEPT) 5 MG tablet Take 5 mg by mouth at bedtime.    Historical Provider, MD  ferrous sulfate 324 (65 FE) MG TBEC TAKE ONE TABLET BY MOUTH TWICE DAILY 04/27/14   Historical Provider, MD  hydrochlorothiazide (HYDRODIURIL) 25 MG tablet Take 25 mg by mouth daily with breakfast.     Historical Provider, MD  hydrochlorothiazide (HYDRODIURIL) 25 MG tablet Take 25 mg by mouth. 07/02/14   Historical Provider, MD  HYDROcodone-acetaminophen (NORCO/VICODIN) 5-325 MG per tablet Take 1-2 tablets by mouth every 4 (four) hours as needed. Patient not taking: Reported on 02/03/2015 07/04/14   Linwood Dibbles, MD  levothyroxine (SYNTHROID, LEVOTHROID) 150 MCG tablet Take 150 mcg by mouth daily before breakfast.    Historical Provider, MD  ondansetron (ZOFRAN) 4 MG tablet Take 1 tablet (4 mg total) by mouth every 6 (six) hours. Patient not taking: Reported on  02/03/2015 07/04/14   Jerelyn Scott, MD  pravastatin (PRAVACHOL) 80 MG tablet Take 80 mg by mouth daily.    Historical Provider, MD  Probiotic Product (PROBIOTIC DAILY PO) Take 1 tablet by mouth daily.    Historical Provider, MD   BP 176/88 mmHg  Pulse 79  Temp(Src) 97.7 F (36.5 C) (Oral)  Resp 16  Ht  (1.575 m)  Wt 60.328 kg  BMI 24.32 kg/m2  SpO2 100% Physical Exam  Constitutional: She is oriented to person, place, and time. She appears well-developed and well-nourished. No distress.  HENT:  Head: Normocephalic and atraumatic.  Mouth/Throat: Oropharynx is clear and moist. No oropharyngeal exudate.  Eyes: Conjunctivae and EOM are normal. Pupils are equal, round, and reactive to light. Right eye exhibits no discharge. Left eye exhibits no discharge. No scleral icterus.  Neck: Normal range of motion. No tracheal deviation present.  Cardiovascular: Normal rate, regular rhythm, normal heart sounds and intact distal pulses.  Exam reveals no gallop and no friction rub.   No murmur heard. Pulmonary/Chest: Effort normal and breath sounds normal. No respiratory distress. She has no wheezes. She has no rales. She exhibits no tenderness.  Abdominal: Soft. Bowel sounds are normal. She exhibits no distension and no mass. There is no tenderness. There is no rebound and no guarding.  Musculoskeletal: Normal range of motion. She exhibits no edema or tenderness.  Patient walked without abnormalities noted.   Lymphadenopathy:    She has no cervical adenopathy.  Neurological: She is alert and oriented to person, place, and time. No cranial nerve deficit. Coordination normal.  Skin: Skin is warm and dry. No rash noted. She is not diaphoretic. No erythema.  Psychiatric: Her behavior is normal.  Patient slightly agitated  Nursing note and vitals reviewed.   ED Course  Procedures (including critical care time) Labs Review Labs Reviewed  TROPONIN I - Abnormal; Notable for the following:     Troponin I 0.04 (*)    All other components within normal limits  BASIC METABOLIC PANEL - Abnormal; Notable for the following:    Glucose, Bld 101 (*)    BUN 22 (*)    All other components within normal limits  TROPONIN I - Abnormal; Notable for the following:    Troponin I 0.05 (*)    All other components within normal limits  URINALYSIS, ROUTINE W REFLEX MICROSCOPIC (NOT AT [facility])  CBC WITH DIFFERENTIAL/PLATELET    Imaging Review Dg Chest 2 View  06/02/2015  CLINICAL DATA:  Altered mental status EXAM: CHEST - 2 VIEW COMPARISON:  None. FINDINGS: Cardiac shadow  is within normal limits. Postoperative changes are seen. The lungs are well aerated bilaterally. Bony structures are within normal limits. IMPRESSION: No acute abnormality seen. Electronically Signed   By: Alcide CleverMark  Lukens M.D.   On: 06/02/2015 13:39   Ct Head Wo Contrast  06/02/2015  CLINICAL DATA:  Altered mental status, confusion, history coronary artery disease post MI and CABG, former smoker, hypertension, dementia, COPD EXAM: CT HEAD WITHOUT CONTRAST TECHNIQUE: Contiguous axial images were obtained from the base of the skull through the vertex without intravenous contrast. COMPARISON:  09/02/2009 FINDINGS: Generalized atrophy. Normal ventricular morphology. No midline shift or mass effect. Otherwise normal appearance of brain parenchyma. No intracranial hemorrhage, mass lesion or evidence acute infarction. No extra-axial fluid collections. Atherosclerotic calcification of internal carotid and vertebral arteries at skullbase. Bones and sinuses unremarkable. IMPRESSION: Generalized atrophy. No acute intracranial abnormalities. Electronically Signed   By: Ulyses SouthwardMark  Boles M.D.   On: 06/02/2015 13:44   Dg Hips Bilat With Pelvis Min 5 Views  06/02/2015  CLINICAL DATA:  Persistent pain for 1 week with difficulty walking. No history of trauma EXAM: DG HIP (WITH OR WITHOUT PELVIS) 5+V BILAT COMPARISON:  None. FINDINGS: Frontal pelvis as well as  frontal and lateral left and right hips, total five views, were obtained. There is no demonstrable fracture or dislocation. Joint spaces appear intact. No erosive change. There are foci of arterial vascular calcification. IMPRESSION: No fracture or dislocation. No appreciable arthropathic change. Multiple foci of arterial atherosclerotic change. Electronically Signed   By: Bretta BangWilliam  Woodruff III M.D.   On: 06/02/2015 13:39   I have personally reviewed and evaluated these images and lab results as part of my medical decision-making.   EKG Interpretation   Date/Time:  Thursday June 02 2015 13:23:33 EST Ventricular Rate:  71 PR Interval:  288 QRS Duration: 86 QT Interval:  494 QTC Calculation: 536 R Axis:   84 Text Interpretation:  Sinus rhythm with 1st degree A-V block with Blocked  Premature atrial complexes ST \\T \ T wave abnormality, consider  inferolateral ischemia Prolonged QT Abnormal ECG no significant change  since 2014 Confirmed by GOLDSTON  MD, SCOTT ([id number]) on 06/02/2015 1:26:14  PM      MDM   Final diagnoses:  Hip pain, unspecified laterality   Patient non-toxic appearing and VSS. Unsure of how reliable the patient is in regards to HPI. Daughter pulled me aside as I was leaving room and stated she felt like her mother was more AMS than baseline. Will perform generalized AMS workup, more focused on change in gait. Dr. Criss AlvineGoldston will see patient as well.  EKG, UA, CBC, BMP unremarkable for acute change.  CT head, CXR, hip films unremarkable.  Troponin elevated at 0.04. Will do serial troponin. Second troponin at 0.05. Discussed case with Dr. Donnald GarrePfeiffer who advised OP followup and discharge.  Patient may be safely discharged home. Discussed reasons for return. Patient to follow-up with primary care provider and ortho (as needed) within one week. Patient in understanding and agreement with the plan.   Melton KrebsSamantha Nicole Audreena Sachdeva, PA-C 06/09/15 [id number]  Pricilla LovelessScott Goldston, MD 06/10/15  [phone]

## 2015-06-02 NOTE — ED Notes (Signed)
MD at bedside. 

## 2015-06-02 NOTE — Discharge Instructions (Signed)
Ms. Wanda HeathMary M Robinson,  Nice meeting you! Please follow-up with an orthopedic provider if you continue to have pain (information for one is attached). Return to the emergency department if develop increasing pain, are unable to walk, have loss of bladder/bowel control. Feel better soon!  S. Lane HackerNicole Luciana Cammarata, PA-C

## 2015-07-11 ENCOUNTER — Other Ambulatory Visit: Payer: Self-pay | Admitting: Family Medicine

## 2015-07-11 DIAGNOSIS — E2839 Other primary ovarian failure: Secondary | ICD-10-CM

## 2015-08-04 ENCOUNTER — Ambulatory Visit
Admission: RE | Admit: 2015-08-04 | Discharge: 2015-08-04 | Disposition: A | Discharge status: Home / Self Care | Payer: Medicare Other | Source: Ambulatory Visit | Attending: Family Medicine | Admitting: Family Medicine

## 2015-08-04 DIAGNOSIS — E2839 Other primary ovarian failure: Secondary | ICD-10-CM

## 2016-02-06 ENCOUNTER — Encounter: Payer: Self-pay | Admitting: Vascular Surgery

## 2016-02-09 ENCOUNTER — Other Ambulatory Visit (HOSPITAL_COMMUNITY): Payer: Medicare Other

## 2016-02-09 ENCOUNTER — Ambulatory Visit: Payer: Medicare Other | Admitting: Vascular Surgery

## 2016-02-09 ENCOUNTER — Encounter (HOSPITAL_COMMUNITY): Payer: Medicare Other

## 2016-03-15 ENCOUNTER — Ambulatory Visit (HOSPITAL_COMMUNITY): Payer: Medicare Other

## 2016-03-22 ENCOUNTER — Ambulatory Visit: Payer: Medicare Other | Admitting: Vascular Surgery

## 2017-04-11 ENCOUNTER — Encounter (HOSPITAL_COMMUNITY): Payer: Self-pay

## 2017-04-11 ENCOUNTER — Emergency Department (HOSPITAL_COMMUNITY): Payer: Medicare Other

## 2017-04-11 ENCOUNTER — Inpatient Hospital Stay (HOSPITAL_COMMUNITY)
Admission: EM | Admit: 2017-04-11 | Discharge: 2017-04-15 | DRG: 884 | Disposition: A | Discharge status: Home Health | Payer: Medicare Other | Attending: Internal Medicine | Admitting: Internal Medicine

## 2017-04-11 DIAGNOSIS — Z951 Presence of aortocoronary bypass graft: Secondary | ICD-10-CM

## 2017-04-11 DIAGNOSIS — I129 Hypertensive chronic kidney disease with stage 1 through stage 4 chronic kidney disease, or unspecified chronic kidney disease: Secondary | ICD-10-CM | POA: Diagnosis present

## 2017-04-11 DIAGNOSIS — E785 Hyperlipidemia, unspecified: Secondary | ICD-10-CM | POA: Diagnosis present

## 2017-04-11 DIAGNOSIS — Z7982 Long term (current) use of aspirin: Secondary | ICD-10-CM

## 2017-04-11 DIAGNOSIS — R4182 Altered mental status, unspecified: Secondary | ICD-10-CM | POA: Diagnosis present

## 2017-04-11 DIAGNOSIS — F0391 Unspecified dementia with behavioral disturbance: Secondary | ICD-10-CM | POA: Diagnosis not present

## 2017-04-11 DIAGNOSIS — I10 Essential (primary) hypertension: Secondary | ICD-10-CM | POA: Diagnosis present

## 2017-04-11 DIAGNOSIS — R410 Disorientation, unspecified: Secondary | ICD-10-CM

## 2017-04-11 DIAGNOSIS — E876 Hypokalemia: Secondary | ICD-10-CM | POA: Diagnosis present

## 2017-04-11 DIAGNOSIS — E039 Hypothyroidism, unspecified: Secondary | ICD-10-CM | POA: Diagnosis present

## 2017-04-11 DIAGNOSIS — I2510 Atherosclerotic heart disease of native coronary artery without angina pectoris: Secondary | ICD-10-CM | POA: Diagnosis present

## 2017-04-11 DIAGNOSIS — I350 Nonrheumatic aortic (valve) stenosis: Secondary | ICD-10-CM | POA: Diagnosis present

## 2017-04-11 DIAGNOSIS — Z885 Allergy status to narcotic agent status: Secondary | ICD-10-CM

## 2017-04-11 DIAGNOSIS — Z8249 Family history of ischemic heart disease and other diseases of the circulatory system: Secondary | ICD-10-CM

## 2017-04-11 DIAGNOSIS — Z86010 Personal history of colonic polyps: Secondary | ICD-10-CM

## 2017-04-11 DIAGNOSIS — I6523 Occlusion and stenosis of bilateral carotid arteries: Secondary | ICD-10-CM | POA: Diagnosis present

## 2017-04-11 DIAGNOSIS — J449 Chronic obstructive pulmonary disease, unspecified: Secondary | ICD-10-CM | POA: Diagnosis present

## 2017-04-11 DIAGNOSIS — F0390 Unspecified dementia without behavioral disturbance: Secondary | ICD-10-CM | POA: Diagnosis present

## 2017-04-11 DIAGNOSIS — Z87891 Personal history of nicotine dependence: Secondary | ICD-10-CM

## 2017-04-11 DIAGNOSIS — Z79899 Other long term (current) drug therapy: Secondary | ICD-10-CM

## 2017-04-11 DIAGNOSIS — N189 Chronic kidney disease, unspecified: Secondary | ICD-10-CM | POA: Diagnosis present

## 2017-04-11 DIAGNOSIS — Z79890 Hormone replacement therapy (postmenopausal): Secondary | ICD-10-CM

## 2017-04-11 DIAGNOSIS — G9341 Metabolic encephalopathy: Secondary | ICD-10-CM | POA: Diagnosis present

## 2017-04-11 DIAGNOSIS — I252 Old myocardial infarction: Secondary | ICD-10-CM

## 2017-04-11 LAB — CBC
HCT: 45.2 % (ref 36.0–46.0)
Hemoglobin: 15.5 g/dL — ABNORMAL HIGH (ref 12.0–15.0)
MCH: 30.8 pg (ref 26.0–34.0)
MCHC: 34.3 g/dL (ref 30.0–36.0)
MCV: 89.7 fL (ref 78.0–100.0)
Platelets: 209 10*3/uL (ref 150–400)
RBC: 5.04 MIL/uL (ref 3.87–5.11)
RDW: 14.7 % (ref 11.5–15.5)
WBC: 6.8 10*3/uL (ref 4.0–10.5)

## 2017-04-11 LAB — URINALYSIS, ROUTINE W REFLEX MICROSCOPIC
Bilirubin Urine: NEGATIVE
Glucose, UA: NEGATIVE mg/dL
Hgb urine dipstick: NEGATIVE
Ketones, ur: 5 mg/dL — AB
Leukocytes, UA: NEGATIVE
Nitrite: NEGATIVE
Protein, ur: 100 mg/dL — AB
Specific Gravity, Urine: 1.012 (ref 1.005–1.030)
pH: 6 (ref 5.0–8.0)

## 2017-04-11 LAB — COMPREHENSIVE METABOLIC PANEL
ALT: 16 U/L (ref 14–54)
AST: 28 U/L (ref 15–41)
Albumin: 4.3 g/dL (ref 3.5–5.0)
Alkaline Phosphatase: 58 U/L (ref 38–126)
Anion gap: 15 (ref 5–15)
BUN: 26 mg/dL — ABNORMAL HIGH (ref 6–20)
CO2: 24 mmol/L (ref 22–32)
Calcium: 9.7 mg/dL (ref 8.9–10.3)
Chloride: 99 mmol/L — ABNORMAL LOW (ref 101–111)
Creatinine, Ser: 1.17 mg/dL — ABNORMAL HIGH (ref 0.44–1.00)
GFR calc Af Amer: 47 mL/min — ABNORMAL LOW (ref >60)
GFR calc non Af Amer: 41 mL/min — ABNORMAL LOW (ref >60)
Glucose, Bld: 106 mg/dL — ABNORMAL HIGH (ref 65–99)
Potassium: 4.2 mmol/L (ref 3.5–5.1)
Sodium: 138 mmol/L (ref 135–145)
Total Bilirubin: 1 mg/dL (ref 0.3–1.2)
Total Protein: 7.3 g/dL (ref 6.5–8.1)

## 2017-04-11 LAB — CBG MONITORING, ED: Glucose-Capillary: 97 mg/dL (ref 65–99)

## 2017-04-11 MED ORDER — IOPAMIDOL (ISOVUE-300) INJECTION 61%
INTRAVENOUS | Status: AC
  Administered 2017-04-11: 75 mL via INTRAVENOUS
  Filled 2017-04-11: qty 75

## 2017-04-11 NOTE — ED Triage Notes (Signed)
Patient's daughter reports that the patient has had increased low back pain. Patient has been taking Tylenol and Tramadol. Patient last used Tramadol and tylenol yesterday. Patient has been hallucinating and having confusion today. Patient's daughter states that the patient had a severe headache on 10/16 and today where she grabs her head for a few minutes then it goes away.

## 2017-04-11 NOTE — ED Notes (Signed)
Attempted to draw labs x1. Unable to obtain blood work

## 2017-04-11 NOTE — ED Notes (Signed)
MD aware of pt's acute urine retention

## 2017-04-11 NOTE — ED Provider Notes (Signed)
[facility] Note   CSN: [id number] Arrival date & time: 04/11/17  1707     History   Chief Complaint Chief Complaint  Patient presents with  . Back Pain  . Altered Mental Status    HPI Wanda Robinson is a 83 y.o. female.  HPI   88yF with change in mental status. Onset ~2 days ago. Hallucinating. Seeing and talking to people who are dead. An alarm was going off in her home so she cut the cord instead of turing it off. Pt lives alone bu family checks on her frequently. Daughter does not think she has been sleeping at night.   Pt had a fall last month. Has been complaining mid/low back pain. Sporadic use of tramadol. Two doses yesterday but otherwise was taking a dose every few days. Daughter assists with meds. She doesn't think she has been taking anything additionally. No more recent falls.     Past Medical History:  Diagnosis Date  . Anemia    past hx. 2011  . Aortic stenosis   . Back pain   . CAD (coronary artery disease)   . Carotid artery occlusion   . Chronic kidney disease 05-05-13   renal dysfunction- has improved from 3'11  . Colon polyps    removed with colonoscopy  . COPD (chronic obstructive pulmonary disease) (HCC)   . Dementia 05-05-13   short term memory issues"no dx"  . Gastric outlet obstruction    10'14- hospital stay of several days([facility])  . Hiatal hernia   . Hyperlipidemia   . Hypertension   . Hypothyroidism   . MI (myocardial infarction) (HCC)   . Renal artery stenosis (HCC)    Previous stenting of renal arteries bilaterally  . Ulcer     Patient Active Problem List   Diagnosis Date Noted  . Acute blood loss anemia 06/05/2013  . Dementia 06/02/2013  . Cellulitis 05/16/2013  . Fever 05/16/2013  . Bradycardia 05/12/2013  . Murmur, cardiac 03/31/2013  . Gastric outlet obstruction 03/28/2013  . Hypokalemia 03/28/2013  . OSTEOPOROSIS NEC 01/15/2007  . HYPOTHYROIDISM 12/12/2006  . HYPERLIPIDEMIA  12/12/2006  . Essential hypertension 12/12/2006  . Coronary atherosclerosis 12/12/2006  . ASTHMA 12/12/2006  . COPD 12/12/2006  . GERD 12/12/2006    Past Surgical History:  Procedure Laterality Date  . CAROTID ENDARTERECTOMY Left 2011   Dr. Darrick Penna  . CHOLECYSTECTOMY     open  . CORONARY ARTERY BYPASS GRAFT  2002   Dr Dorris Fetch  . GASTROSTOMY N/A 05/12/2013   Procedure: G TUBE PLACEMENT;  Surgeon: Axel Filler, MD;  Location: [facility];  Service: General;  Laterality: N/A;  . HIATAL HERNIA REPAIR N/A 05/12/2013   Procedure: LAPAROSCOPIC REPAIR OF HIATAL HERNIA, ;  Surgeon: Axel Filler, MD;  Location: [facility];  Service: General;  Laterality: N/A;  . INSERTION OF MESH N/A 05/12/2013   Procedure: INSERTION OF MESH;  Surgeon: Axel Filler, MD;  Location: [facility];  Service: General;  Laterality: N/A;    OB History    No data available       Home Medications    Prior to Admission medications   Medication Sig Start Date End Date Taking? Authorizing Provider  amLODipine (NORVASC) 10 MG tablet Take 10 mg by mouth daily with breakfast.    Yes [provider]  aspirin EC 81 MG tablet Take 81 mg by mouth daily.   Yes [provider]  hydrochlorothiazide (HYDRODIURIL) 25 MG tablet Take 25 mg  by mouth daily with breakfast.    Yes [provider]  levothyroxine (SYNTHROID, LEVOTHROID) 125 MCG tablet Take 125 mcg by mouth daily before breakfast.   Yes [provider]  pravastatin (PRAVACHOL) 40 MG tablet Take 40 mg by mouth daily.   Yes [provider]  traMADol (ULTRAM) 50 MG tablet Take by mouth. 02/27/17 02/27/18 Yes [provider]  HYDROcodone-acetaminophen (NORCO/VICODIN) 5-325 MG per tablet Take 1-2 tablets by mouth every 4 (four) hours as needed. Patient not taking: Reported on 02/03/2015 07/04/14   Linwood Dibbles, MD  ondansetron (ZOFRAN) 4 MG tablet Take 1 tablet (4 mg total) by mouth every 6 (six) hours. Patient not taking:  Reported on 02/03/2015 07/04/14   Mabe, Latanya Maudlin, MD    Family History Family History  Problem Relation Age of Onset  . Heart disease Father        CAD  . Heart attack Father   . Hypertension Father   . Cancer Mother   . Cancer Sister   . Heart disease Brother        before age 37  . Heart attack Brother   . Hypertension Brother   . Cancer Son   . Hypertension Son     Social History Social History  Substance Use Topics  . Smoking status: Former Smoker    Quit date: 05/05/1989  . Smokeless tobacco: Never Used  . Alcohol use No     Allergies   Codeine phosphate   Review of Systems Review of Systems  All systems reviewed and negative, other than as noted in HPI.  Physical Exam Updated Vital Signs BP 125/80 (BP Location: Left Arm)   Pulse 67   Temp 98.3 F (36.8 C) (Oral)   Resp 16   Ht 5\' 2"  (1.575 m)   Wt 62.1 kg (137 lb)   SpO2 99%   BMI 25.06 kg/m   Physical Exam  Constitutional: She appears well-developed and well-nourished. No distress.  HENT:  Head: Normocephalic and atraumatic.  Eyes: Conjunctivae are normal. Right eye exhibits no discharge. Left eye exhibits no discharge.  Neck: Neck supple.  Cardiovascular: Exam reveals no gallop and no friction rub.   No murmur heard. Loud systolic murmur with radiation to carotids. Mild bradycardia. Irregular.   Pulmonary/Chest: Effort normal and breath sounds normal. No respiratory distress.  Abdominal: Soft. She exhibits no distension. There is no tenderness.  Musculoskeletal: She exhibits no edema or tenderness.  No midline spinal tenderness. Superficial abrasion to just R of midline lower thoracic back. Daughter suspects that may having occurred when removing pain patch? No signs of infection.   Pt was able to get up from seated position, walk across exam room and then sit back down unassisted. Did not appear to be uncomfortable.   Neurological: She is alert. No cranial nerve deficit. She exhibits normal  muscle tone. Coordination normal.  Skin: Skin is warm and dry.  Psychiatric:  Confused. Frequently introducing me to her family at bedside and intermittently identifying daughter as sister and son-in-law as daughter-in-law although sometimes correcting herself. Disoriented to time.   Nursing note and vitals reviewed.    ED Treatments / Results  Labs (all labs ordered are listed, but only abnormal results are displayed) Labs Reviewed  COMPREHENSIVE METABOLIC PANEL - Abnormal; Notable for the following:       Result Value   Chloride 99 (*)    Glucose, Bld 106 (*)    BUN 26 (*)    Creatinine, Ser  1.17 (*)    GFR calc non Af Amer 41 (*)    GFR calc Af Amer 47 (*)    All other components within normal limits  CBC - Abnormal; Notable for the following:    Hemoglobin 15.5 (*)    All other components within normal limits  URINALYSIS, ROUTINE W REFLEX MICROSCOPIC - Abnormal; Notable for the following:    Ketones, ur 5 (*)    Protein, ur 100 (*)    Bacteria, UA FEW (*)    Squamous Epithelial / LPF 0-5 (*)    All other components within normal limits  CBG MONITORING, ED  CBG MONITORING, ED    EKG  EKG Interpretation None       Radiology No results found.  Procedures Procedures (including critical care time)  Medications Ordered in ED Medications - No data to display   Initial Impression / Assessment and Plan / ED Course  I have reviewed the triage vital signs and the nursing notes.  Pertinent labs & imaging results that were available during my care of the patient were reviewed by me and considered in my medical decision making (see chart for details).     88yF with confusion. May be related to tramadol? Last dose yesterday though.  Consider UTI.   Reportedly having intermittent HA per family? Daughter noted her grabbing her head briefly like it may be hurting her.  Pt denies this although not a reliable historian. With confusion, will CT head.   I'm not sure back  pain is directly related. She is able to get up/down from chair and walk without apparent problem. Denies numbness/tingling. Known AAA although only 3cm on prior imaging years ago. Is due for surveillance imaging anyways. Will CT to eval and also get further detail of spine. I doubt her pain is from AAA though. I cannot palpate it on my exam.   Abnormal EKG. Known Mobitz type 1 and clear on prior tracings. Harder to interpret with 2:1 conduction currently. HR does occasionally drop to 30s as shown on EKG but even then she doesn't seem to be symptomatic from it.   W/u fairly unremarkable. I do not have a clear explanation for her delirium. Last dose of tramadol was over a day ago and just using sporadically.   Urinary retention. 461 ml on bladder scan after voiding. Opiates can potentially cause retention although I can't remember ever seeing it with tramadol specifically. Foley deferred. Straightcath and re-evaluate.   Final Clinical Impressions(s) / ED Diagnoses   Final diagnoses:  Delirium    New Prescriptions New Prescriptions   No medications on file     Raeford RazorKohut, Adison Reifsteck, MD 04/16/17 1437

## 2017-04-11 NOTE — ED Notes (Signed)
Post void residual: 

## 2017-04-12 ENCOUNTER — Observation Stay (HOSPITAL_COMMUNITY): Payer: Medicare Other

## 2017-04-12 DIAGNOSIS — I10 Essential (primary) hypertension: Secondary | ICD-10-CM | POA: Diagnosis not present

## 2017-04-12 DIAGNOSIS — R4182 Altered mental status, unspecified: Secondary | ICD-10-CM | POA: Diagnosis not present

## 2017-04-12 DIAGNOSIS — Z79890 Hormone replacement therapy (postmenopausal): Secondary | ICD-10-CM | POA: Diagnosis not present

## 2017-04-12 DIAGNOSIS — I129 Hypertensive chronic kidney disease with stage 1 through stage 4 chronic kidney disease, or unspecified chronic kidney disease: Secondary | ICD-10-CM | POA: Diagnosis present

## 2017-04-12 DIAGNOSIS — I2510 Atherosclerotic heart disease of native coronary artery without angina pectoris: Secondary | ICD-10-CM | POA: Diagnosis present

## 2017-04-12 DIAGNOSIS — Z79899 Other long term (current) drug therapy: Secondary | ICD-10-CM | POA: Diagnosis not present

## 2017-04-12 DIAGNOSIS — Z8249 Family history of ischemic heart disease and other diseases of the circulatory system: Secondary | ICD-10-CM | POA: Diagnosis not present

## 2017-04-12 DIAGNOSIS — Z86010 Personal history of colonic polyps: Secondary | ICD-10-CM | POA: Diagnosis not present

## 2017-04-12 DIAGNOSIS — F0151 Vascular dementia with behavioral disturbance: Secondary | ICD-10-CM

## 2017-04-12 DIAGNOSIS — J449 Chronic obstructive pulmonary disease, unspecified: Secondary | ICD-10-CM | POA: Diagnosis present

## 2017-04-12 DIAGNOSIS — Z87891 Personal history of nicotine dependence: Secondary | ICD-10-CM | POA: Diagnosis not present

## 2017-04-12 DIAGNOSIS — Z7982 Long term (current) use of aspirin: Secondary | ICD-10-CM | POA: Diagnosis not present

## 2017-04-12 DIAGNOSIS — R400 Somnolence: Secondary | ICD-10-CM | POA: Diagnosis not present

## 2017-04-12 DIAGNOSIS — E876 Hypokalemia: Secondary | ICD-10-CM | POA: Diagnosis present

## 2017-04-12 DIAGNOSIS — R410 Disorientation, unspecified: Secondary | ICD-10-CM

## 2017-04-12 DIAGNOSIS — E039 Hypothyroidism, unspecified: Secondary | ICD-10-CM | POA: Diagnosis present

## 2017-04-12 DIAGNOSIS — G9341 Metabolic encephalopathy: Secondary | ICD-10-CM | POA: Diagnosis not present

## 2017-04-12 DIAGNOSIS — N189 Chronic kidney disease, unspecified: Secondary | ICD-10-CM | POA: Diagnosis present

## 2017-04-12 DIAGNOSIS — Z951 Presence of aortocoronary bypass graft: Secondary | ICD-10-CM | POA: Diagnosis not present

## 2017-04-12 DIAGNOSIS — F0391 Unspecified dementia with behavioral disturbance: Secondary | ICD-10-CM | POA: Diagnosis present

## 2017-04-12 DIAGNOSIS — I6523 Occlusion and stenosis of bilateral carotid arteries: Secondary | ICD-10-CM | POA: Diagnosis present

## 2017-04-12 DIAGNOSIS — I350 Nonrheumatic aortic (valve) stenosis: Secondary | ICD-10-CM | POA: Diagnosis present

## 2017-04-12 DIAGNOSIS — Z885 Allergy status to narcotic agent status: Secondary | ICD-10-CM | POA: Diagnosis not present

## 2017-04-12 DIAGNOSIS — E785 Hyperlipidemia, unspecified: Secondary | ICD-10-CM | POA: Diagnosis present

## 2017-04-12 DIAGNOSIS — I252 Old myocardial infarction: Secondary | ICD-10-CM | POA: Diagnosis not present

## 2017-04-12 LAB — COMPREHENSIVE METABOLIC PANEL
ALK PHOS: 59 U/L (ref 38–126)
ALT: 17 U/L (ref 14–54)
AST: 27 U/L (ref 15–41)
Albumin: 4.5 g/dL (ref 3.5–5.0)
Anion gap: 15 (ref 5–15)
BILIRUBIN TOTAL: 1.2 mg/dL (ref 0.3–1.2)
BUN: 21 mg/dL — ABNORMAL HIGH (ref 6–20)
CALCIUM: 9.9 mg/dL (ref 8.9–10.3)
CO2: 24 mmol/L (ref 22–32)
CREATININE: 1.01 mg/dL — AB (ref 0.44–1.00)
Chloride: 100 mmol/L — ABNORMAL LOW (ref 101–111)
GFR calc non Af Amer: 49 mL/min — ABNORMAL LOW (ref >60)
GFR, EST AFRICAN AMERICAN: 57 mL/min — AB (ref >60)
Glucose, Bld: 112 mg/dL — ABNORMAL HIGH (ref 65–99)
Potassium: 3.5 mmol/L (ref 3.5–5.1)
SODIUM: 139 mmol/L (ref 135–145)
Total Protein: 7.7 g/dL (ref 6.5–8.1)

## 2017-04-12 LAB — CBC
HCT: 48.3 % — ABNORMAL HIGH (ref 36.0–46.0)
Hemoglobin: 16.8 g/dL — ABNORMAL HIGH (ref 12.0–15.0)
MCH: 30.8 pg (ref 26.0–34.0)
MCHC: 34.8 g/dL (ref 30.0–36.0)
MCV: 88.6 fL (ref 78.0–100.0)
PLATELETS: 220 10*3/uL (ref 150–400)
RBC: 5.45 MIL/uL — AB (ref 3.87–5.11)
RDW: 14.5 % (ref 11.5–15.5)
WBC: 6.7 10*3/uL (ref 4.0–10.5)

## 2017-04-12 LAB — TSH: TSH: 62.468 u[IU]/mL — ABNORMAL HIGH (ref 0.350–4.500)

## 2017-04-12 LAB — VITAMIN B12: VITAMIN B 12: 387 pg/mL (ref 180–914)

## 2017-04-12 LAB — SEDIMENTATION RATE: Sed Rate: 5 mm/hr (ref 0–22)

## 2017-04-12 LAB — FOLATE: Folate: 17.3 ng/mL (ref >5.9)

## 2017-04-12 LAB — T4, FREE: FREE T4: 0.97 ng/dL (ref 0.61–1.12)

## 2017-04-12 LAB — RPR: RPR: NONREACTIVE

## 2017-04-12 LAB — HIV ANTIBODY (ROUTINE TESTING W REFLEX): HIV SCREEN 4TH GENERATION: NONREACTIVE

## 2017-04-12 MED ORDER — AMLODIPINE BESYLATE 10 MG PO TABS
10.0000 mg | ORAL_TABLET | Freq: Every day | ORAL | Status: DC
  Administered 2017-04-12 – 2017-04-15 (×4): 10 mg via ORAL
  Filled 2017-04-12 (×4): qty 1

## 2017-04-12 MED ORDER — HALOPERIDOL LACTATE 5 MG/ML IJ SOLN
2.0000 mg | Freq: Four times a day (QID) | INTRAMUSCULAR | Status: DC | PRN
  Administered 2017-04-12 – 2017-04-14 (×2): 2 mg via INTRAVENOUS
  Filled 2017-04-12 (×2): qty 1

## 2017-04-12 MED ORDER — LEVOTHYROXINE SODIUM 125 MCG PO TABS
125.0000 ug | ORAL_TABLET | Freq: Every day | ORAL | Status: DC
  Administered 2017-04-12: 125 ug via ORAL
  Filled 2017-04-12: qty 1

## 2017-04-12 MED ORDER — LEVOTHYROXINE SODIUM 100 MCG IV SOLR: 25.0000 ug | Freq: Every day | INTRAVENOUS | Status: DC

## 2017-04-12 MED ORDER — ENOXAPARIN SODIUM 30 MG/0.3ML ~~LOC~~ SOLN
30.0000 mg | SUBCUTANEOUS | Status: DC
  Administered 2017-04-12: 30 mg via SUBCUTANEOUS
  Filled 2017-04-12: qty 0.3

## 2017-04-12 MED ORDER — ACETAMINOPHEN 325 MG PO TABS
650.0000 mg | ORAL_TABLET | Freq: Four times a day (QID) | ORAL | Status: DC | PRN
  Administered 2017-04-12 (×2): 650 mg via ORAL
  Filled 2017-04-12 (×2): qty 2

## 2017-04-12 MED ORDER — HYDRALAZINE HCL 20 MG/ML IJ SOLN
10.0000 mg | Freq: Four times a day (QID) | INTRAMUSCULAR | Status: DC | PRN
  Administered 2017-04-13 – 2017-04-14 (×2): 10 mg via INTRAVENOUS
  Filled 2017-04-12 (×2): qty 1

## 2017-04-12 MED ORDER — LORAZEPAM 2 MG/ML IJ SOLN
1.0000 mg | Freq: Once | INTRAMUSCULAR | Status: AC
  Administered 2017-04-12: 1 mg via INTRAVENOUS
  Filled 2017-04-12: qty 1

## 2017-04-12 MED ORDER — QUETIAPINE FUMARATE 25 MG PO TABS
25.0000 mg | ORAL_TABLET | Freq: Every day | ORAL | Status: DC
  Administered 2017-04-12 – 2017-04-14 (×3): 25 mg via ORAL
  Filled 2017-04-12 (×3): qty 1

## 2017-04-12 MED ORDER — ENOXAPARIN SODIUM 40 MG/0.4ML ~~LOC~~ SOLN
40.0000 mg | SUBCUTANEOUS | Status: DC
  Administered 2017-04-13: 40 mg via SUBCUTANEOUS
  Filled 2017-04-12: qty 0.4

## 2017-04-12 MED ORDER — HYDRALAZINE HCL 20 MG/ML IJ SOLN
5.0000 mg | Freq: Four times a day (QID) | INTRAMUSCULAR | Status: DC | PRN
  Administered 2017-04-12: 5 mg via INTRAVENOUS
  Filled 2017-04-12: qty 1

## 2017-04-12 MED ORDER — PRAVASTATIN SODIUM 40 MG PO TABS
40.0000 mg | ORAL_TABLET | Freq: Every day | ORAL | Status: DC
  Administered 2017-04-12 – 2017-04-15 (×4): 40 mg via ORAL
  Filled 2017-04-12 (×4): qty 1

## 2017-04-12 MED ORDER — ASPIRIN EC 81 MG PO TBEC
81.0000 mg | DELAYED_RELEASE_TABLET | Freq: Every day | ORAL | Status: DC
Start: 2017-04-12 — End: 2017-04-15
  Administered 2017-04-12 – 2017-04-15 (×4): 81 mg via ORAL
  Filled 2017-04-12 (×4): qty 1

## 2017-04-12 MED ORDER — HALOPERIDOL LACTATE 5 MG/ML IJ SOLN
1.0000 mg | Freq: Once | INTRAMUSCULAR | Status: AC
  Administered 2017-04-12: 1 mg via INTRAVENOUS
  Filled 2017-04-12: qty 1

## 2017-04-12 MED ORDER — SODIUM CHLORIDE 0.9 % IV SOLN
INTRAVENOUS | Status: DC
  Administered 2017-04-12 (×2): via INTRAVENOUS

## 2017-04-12 MED ORDER — LEVOTHYROXINE SODIUM 100 MCG IV SOLR
75.0000 ug | Freq: Every day | INTRAVENOUS | Status: DC
  Administered 2017-04-13 – 2017-04-14 (×2): 75 ug via INTRAVENOUS
  Filled 2017-04-12 (×2): qty 5

## 2017-04-12 MED ORDER — POTASSIUM CHLORIDE IN NACL 20-0.9 MEQ/L-% IV SOLN
INTRAVENOUS | Status: DC
  Administered 2017-04-12 – 2017-04-14 (×4): via INTRAVENOUS
  Filled 2017-04-12 (×4): qty 1000

## 2017-04-12 MED ORDER — HYDROCHLOROTHIAZIDE 25 MG PO TABS
25.0000 mg | ORAL_TABLET | Freq: Every day | ORAL | Status: DC
  Administered 2017-04-12: 25 mg via ORAL
  Filled 2017-04-12: qty 1

## 2017-04-12 MED ORDER — OXYCODONE-ACETAMINOPHEN 5-325 MG PO TABS
1.0000 | ORAL_TABLET | Freq: Three times a day (TID) | ORAL | Status: DC | PRN
  Administered 2017-04-12 – 2017-04-15 (×5): 1 via ORAL
  Filled 2017-04-12 (×5): qty 1

## 2017-04-12 MED ORDER — ACETAMINOPHEN 650 MG RE SUPP: 650.0000 mg | Freq: Four times a day (QID) | RECTAL | Status: DC | PRN

## 2017-04-12 NOTE — Progress Notes (Signed)
IV has been restarted- mitts reapplied - pt continues to try & remove mitts

## 2017-04-12 NOTE — Progress Notes (Addendum)
patient seen and examined , chart reviewed   83 y.o. female, w dementia, carotid stenosis, CAD, Aortic stenosis, hypothyroidism apparently has been confused and having visual hallucination over the past 2 days according to her daughter.  Pt's daughter called Dr. Darrick PennaFields office and was told to go to ER. CT head shows No acute intracranial pathology seen on CT. ua negative, Acute delirium thought to be due to tramadol  PT is extremely irritable and agitated. She stated "I want to go home." PT hit and kicked at staff and tried to leave room. Pt is hallucinating and stated "there are little kids in the corner of the room."   Has received 3 mg of haldol and 1 mg lorazepam, will start Seroquel tonight   TSH noted to be >62, severely hypothyroid, started on iv synthroid at 75mcg (takes 125mcg PO at home)   , FREE T4,t3   SLP eval to r/o aspiration

## 2017-04-12 NOTE — Care Management Note (Signed)
Case Management Note  Patient Details  Name: Wanda HeathMary M Bowden MRN: [id number] Date of Birth: 12-09-1928  Subjective/Objective:                  AMS  Action/Plan: Date:  April 12, 2017 Chart reviewed for concurrent status and case management needs.  Will continue to follow patient progress.  Discharge Planning: following for needs  Expected discharge date: [id number]  Marcelle SmilingRhonda Christina Waldrop, BSN, [place]oraRN3, [place]CCM   [phone]   Expected Discharge Date:  04/14/17               Expected Discharge Plan:  Home/Self Care  In-House Referral:     Discharge planning Services  CM Consult  Post Acute Care Choice:    Choice offered to:     DME Arranged:    DME Agency:     HH Arranged:    HH Agency:     Status of Service:  In process, will continue to follow  If discussed at Long Length of Stay Meetings, dates discussed:    Additional Comments:  Golda AcreDavis, Stella Bortle Lynn, RN 04/12/2017, 8:40 AM

## 2017-04-12 NOTE — Progress Notes (Signed)
Despite frequent RN contact & reorientation, explaining the IV and telemetry- pt pulled out IV that had been obscured with kerlix gauze wrap , Pulled telemetry leads off. Pt was cleaned, explanation of & importance of those features re explained. Safety mitts applied

## 2017-04-12 NOTE — Progress Notes (Signed)
Carotid duplex  has been completed. Preliminary results can be found: Chart review -> CV Proc  Nahal Wanless Eunice, RDMS, RVT   

## 2017-04-12 NOTE — Progress Notes (Signed)
SLP Cancellation Note  Patient Details Name: Wanda Robinson MRN: [id number] DOB: 08-24-1928   Cancelled treatment:       Reason Eval/Treat Not Completed: Fatigue/lethargy limiting ability to participate. Per RN, pt was given Ativan for MRI, and is currently insufficiently arousable for evaluation of swallow function and safety. Prior to admit, pt lived alone, and had no swallowing difficulty. RN also indicated pt tolerated meds without difficulty earlier today. Daughter present, and was informed that SLP will continue efforts to evaluate.  Wanda Robinson, Sagewest LanderMSP, CCC-SLP Speech Language Pathologist [phone]  Wanda Robinson, Wanda Robinson Brown 04/12/2017, 2:08 PM

## 2017-04-12 NOTE — Progress Notes (Signed)
Tele sitter initiated, pt using her teeth to release the velcro of the safety hand mitts removing them frequently

## 2017-04-12 NOTE — H&P (Addendum)
TRH H&P   Patient Demographics:    Wanda Robinson, is a 83 y.o. female  MRN: [id number]   DOB - Aug 15, 1928  Admit Date - 04/11/2017  Outpatient Primary MD for the patient is Podraza, Sindy Messing, PA-C  Referring MD/NP/PA: Virgel Manifold  Outpatient Specialists:  Charles [facility] Peter [place]  Patient coming from: home  Chief Complaint  Patient presents with  . Back Pain  . Altered Mental Status      HPI:    Wanda Robinson  is a 83 y.o. female, w dementia, carotid stenosis, CAD, Aortic stenosis, hypothyroidism apparently has been confused and having visual hallucination over the past 2 days according to her daughter.  Pt's daughter called Dr. Oneida Alar office and was told to go to ER.  In ER,  Wbc 6.8, Hgb 15.5, Plt 209,  Bun 26, Creatinine 1.17 Glucose 106   CT brain  IMPRESSION: 1. No acute intracranial pathology seen on CT. 2. Moderate cortical volume loss and scattered small vessel ischemic microangiopathy.  CT abd IMPRESSION: 1. No CT evidence for acute intra-abdominal or pelvic abnormality. 2. No change in asymmetric biliary dilatation involving left hepatic lobe status post cholecystectomy. 3. Nonobstructing stones within both kidneys 4. 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 This measures up to 3.3 cm. Recommend followup by ultrasound in 3 years.   ua negative,   Only new medication was Tramadol for back pain.   Pt will be admitted for altered mental status.     Review of systems:    In addition to the HPI above, No Fever-chills, No Headache, No changes with Vision or hearing, No problems swallowing food or Liquids, No Chest pain, Cough or Shortness of Breath, No Abdominal pain, No Nausea or Vommitting, Bowel movements are regular, No Blood in stool or Urine, No dysuria, No new skin rashes or bruises, No new joints pains-aches,    No new weakness, tingling, numbness in any extremity, No recent weight gain or loss, No polyuria, polydypsia or polyphagia, No significant Mental Stressors.  A full 10 point Review of Systems was done, except as stated above, all other Review of Systems were negative.   With Past History of the following :    Past Medical History:  Diagnosis Date  . Anemia    past hx. 2011  . Aortic stenosis   . Back pain   . CAD (coronary artery disease)   . Carotid artery occlusion   . Chronic kidney disease 05-05-13   renal dysfunction- has improved from 3'11  . Colon polyps    removed with colonoscopy  . COPD (chronic obstructive pulmonary disease) ([place])   . Dementia 05-05-13   short term memory issues"no dx"  . Gastric outlet obstruction    10'14- hospital stay of several days([facility])  . Hiatal hernia   . Hyperlipidemia   . Hypertension   . Hypothyroidism   .  MI (myocardial infarction) ([place])   . Renal artery stenosis ([facility])    Previous stenting of renal arteries bilaterally  . Ulcer       Past Surgical History:  Procedure Laterality Date  . CAROTID ENDARTERECTOMY Left 2011   Dr. Oneida Alar  . CHOLECYSTECTOMY     open  . CORONARY ARTERY BYPASS GRAFT  2002   Dr Roxan Hockey  . GASTROSTOMY N/A 05/12/2013   Procedure: G TUBE PLACEMENT;  Surgeon: Ralene Ok, MD;  Location: [facility];  Service: General;  Laterality: N/A;  . HIATAL HERNIA REPAIR N/A 05/12/2013   Procedure: LAPAROSCOPIC REPAIR OF HIATAL HERNIA, ;  Surgeon: Ralene Ok, MD;  Location: [facility];  Service: General;  Laterality: N/A;  . INSERTION OF MESH N/A 05/12/2013   Procedure: INSERTION OF MESH;  Surgeon: Ralene Ok, MD;  Location: [facility];  Service: General;  Laterality: N/A;      Social History:     Social History  Substance Use Topics  . Smoking status: Former Smoker    Quit date: 05/05/1989  . Smokeless tobacco: Never Used  . Alcohol use No     Lives - at home  Mobility - ???   Family History  :     Family History  Problem Relation Age of Onset  . Heart disease Father        CAD  . Heart attack Father   . Hypertension Father   . Cancer Mother   . Cancer Sister   . Heart disease Brother        before age 26  . Heart attack Brother   . Hypertension Brother   . Cancer Son   . Hypertension Son      Home Medications:   Prior to Admission medications   Medication Sig Start Date End Date Taking? Authorizing Provider  acetaminophen (TYLENOL) 500 MG tablet Take 500 mg by mouth every 6 (six) hours as needed for moderate pain.   Yes [provider]  amLODipine (NORVASC) 10 MG tablet Take 10 mg by mouth daily with breakfast.    Yes [provider]  aspirin EC 81 MG tablet Take 81 mg by mouth daily.   Yes [provider]  hydrochlorothiazide (HYDRODIURIL) 25 MG tablet Take 25 mg by mouth daily with breakfast.    Yes [provider]  levothyroxine (SYNTHROID, LEVOTHROID) 125 MCG tablet Take 125 mcg by mouth daily before breakfast.   Yes [provider]  pravastatin (PRAVACHOL) 40 MG tablet Take 40 mg by mouth daily.   Yes [provider]  traMADol (ULTRAM) 50 MG tablet Take 50 mg by mouth every 6 (six) hours as needed for moderate pain or severe pain.  02/27/17 02/27/18 Yes [provider]  HYDROcodone-acetaminophen (NORCO/VICODIN) 5-325 MG per tablet Take 1-2 tablets by mouth every 4 (four) hours as needed. Patient not taking: Reported on 02/03/2015 07/04/14   Dorie Rank, MD  ondansetron (ZOFRAN) 4 MG tablet Take 1 tablet (4 mg total) by mouth every 6 (six) hours. Patient not taking: Reported on 02/03/2015 07/04/14   Pixie Casino, MD     Allergies:     Allergies  Allergen Reactions  . Codeine Phosphate Nausea Only     Physical Exam:   Vitals  Blood pressure (!) 158/88, pulse 84, temperature 98 F (36.7 C), temperature source Oral, resp. rate (!) 28, height _0  (1.575 m), weight 62.1 kg (137 lb), SpO2 96  %.   1. General  lying in bed in  NAD,    2. Normal affect and insight, Not Suicidal or Homicidal, Awake Alert, Oriented X 1.  3. No F.N deficits, ALL C.Nerves Intact, Strength 5/5 all 4 extremities, Sensation intact all 4 extremities, Plantars down going.  4. Ears and Eyes appear Normal, Conjunctivae clear, PERRLA. Moist Oral Mucosa.  5. Supple Neck, No JVD, No cervical lymphadenopathy appriciated, No Carotid Bruits.  6. Symmetrical Chest wall movement, Good air movement bilaterally, CTAB.  7. rrr s1, s2  2/6 sem rusb => neck   8. Positive Bowel Sounds, Abdomen Soft, No tenderness, No organomegaly appriciated,No rebound -guarding or rigidity.  9.  No Cyanosis, Normal Skin Turgor, No Skin Rash or Bruise.  10. Good muscle tone,  joints appear normal , no effusions, Normal ROM.  11. No Palpable Lymph Nodes in Neck or Axillae     Data Review:    CBC  Recent Labs Lab 04/11/17 2000  WBC 6.8  HGB 15.5*  HCT 45.2  PLT 209  MCV 89.7  MCH 30.8  MCHC 34.3  RDW 14.7   ------------------------------------------------------------------------------------------------------------------  Chemistries   Recent Labs Lab 04/11/17 2000  NA 138  K 4.2  CL 99*  CO2 24  GLUCOSE 106*  BUN 26*  CREATININE 1.17*  CALCIUM 9.7  AST 28  ALT 16  ALKPHOS 58  BILITOT 1.0   ------------------------------------------------------------------------------------------------------------------ estimated creatinine clearance is 29.9 mL/min (A) (by C-G formula based on SCr of 1.17 mg/dL (H)). ------------------------------------------------------------------------------------------------------------------ No results for input(s): TSH, T4TOTAL, T3FREE, THYROIDAB in the last 72 hours.  Invalid input(s): FREET3  Coagulation profile No results for input(s): INR, PROTIME in the last 168  hours. ------------------------------------------------------------------------------------------------------------------- No results for input(s): DDIMER in the last 72 hours. -------------------------------------------------------------------------------------------------------------------  Cardiac Enzymes No results for input(s): CKMB, TROPONINI, MYOGLOBIN in the last 168 hours.  Invalid input(s): CK ------------------------------------------------------------------------------------------------------------------ No results found for: BNP   ---------------------------------------------------------------------------------------------------------------  Urinalysis    Component Value Date/Time   COLORURINE YELLOW 04/11/2017 1922   APPEARANCEUR CLEAR 04/11/2017 1922   LABSPEC 1.012 04/11/2017 1922   PHURINE 6.0 04/11/2017 [place] NEGATIVE 04/11/2017 1922   HGBUR NEGATIVE 04/11/2017 1922   HGBUR negative 01/19/2008 1008   BILIRUBINUR NEGATIVE 04/11/2017 1922   KETONESUR 5 (A) 04/11/2017 1922   PROTEINUR 100 (A) 04/11/2017 1922   UROBILINOGEN 1.0 07/04/2014 1447   NITRITE NEGATIVE 04/11/2017 1922   LEUKOCYTESUR NEGATIVE 04/11/2017 1922    ----------------------------------------------------------------------------------------------------------------   Imaging Results:    Ct Head Wo Contrast  Result Date: 04/11/2017 CLINICAL DATA:  Acute onset of hallucinations and confusion. Severe headache. Initial encounter. EXAM: CT HEAD WITHOUT CONTRAST TECHNIQUE: Contiguous axial images were obtained from the base of the skull through the vertex without intravenous contrast. COMPARISON:  CT of the head performed 06/02/2015, and MRI of the brain performed 09/04/2009 FINDINGS: Brain: No evidence of acute infarction, hemorrhage, hydrocephalus, extra-axial collection or mass lesion/mass effect. Prominence of the ventricles and sulci reflects moderate cortical volume loss. Mild cerebellar  atrophy is noted. Scattered periventricular white matter change likely reflects small vessel ischemic microangiopathy. The brainstem and fourth ventricle are within normal limits. The basal ganglia are unremarkable in appearance. The cerebral hemispheres demonstrate grossly normal gray-white differentiation. No mass effect or midline shift is seen. Vascular: No hyperdense vessel or unexpected calcification. Skull: There is no evidence of fracture; visualized osseous structures are unremarkable in appearance. Sinuses/Orbits: The visualized portions of the orbits are within normal limits. The paranasal sinuses and mastoid air cells are well-aerated. Other: No significant soft  tissue abnormalities are seen. IMPRESSION: 1. No acute intracranial pathology seen on CT. 2. Moderate cortical volume loss and scattered small vessel ischemic microangiopathy. Electronically Signed   By: Garald Balding M.D.   On: 04/11/2017 21:35   Ct Abdomen Pelvis W Contrast  Result Date: 04/11/2017 CLINICAL DATA:  Abdominal pain EXAM: CT ABDOMEN AND PELVIS WITH CONTRAST TECHNIQUE: Multidetector CT imaging of the abdomen and pelvis was performed using the standard protocol following bolus administration of intravenous contrast. CONTRAST:  75 mL Isovue 300 intravenous COMPARISON:  05/16/2013, 04/20/2013 FINDINGS: Lower chest: Lung bases demonstrate no acute consolidation or pleural effusion. Coronary calcification. Hepatobiliary: Re- demonstrated asymmetric biliary dilatation in the left hepatic lobe with hypodensities, likely small cysts. Hypodensity near the falciform ligament with adjacent punctate calcification also unchanged. Surgical absence of the gallbladder. Pancreas: Unremarkable. No pancreatic ductal dilatation or surrounding inflammatory changes. Spleen: Normal in size without focal abnormality. Adrenals/Urinary Tract: Adrenal glands are within normal limits. Punctate intrarenal stones bilaterally. No hydronephrosis. Atrophy and  scarring of the right kidney. Cyst in the lower pole of the right kidney. Stable subcentimeter hypodensity lower pole left kidney likely a cyst. Bladder unremarkable Stomach/Bowel: The stomach is non enlarged. The patient has had prior hiatal hernia repair. Linear density extending from body of the stomach toward the skin surface consistent with old slight of gastrostomy tube. No dilated small bowel. Sigmoid colon diverticular disease without acute inflammation Vascular/Lymphatic: Extensive atherosclerosis. Mild aneurysmal dilatation of the proximal infrarenal abdominal aorta measuring 3.3 cm. No significantly enlarged abdominal or pelvic lymph nodes. Reproductive: Uterus and bilateral adnexa are unremarkable. Other: Negative for free air or free fluid. Interval repair previously noted bowel containing left inguinal hernia. Musculoskeletal: Degenerative changes of the spine. No acute or suspicious bone lesion. IMPRESSION: 1. No CT evidence for acute intra-abdominal or pelvic abnormality. 2. No change in asymmetric biliary dilatation involving left hepatic lobe status post cholecystectomy. 3. Nonobstructing stones within both kidneys 4. 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 This measures up to 3.3 cm. Recommend followup by ultrasound in 3 years. This recommendation follows ACR consensus guidelines: White Paper of the ACR Incidental Findings Committee II on Vascular Findings. Natasha Mead Coll Radiol 2013; 10:789-794 Electronically Signed   By: Donavan Foil M.D.   On: 04/11/2017 21:57      Assessment & Plan:    Principal Problem:   Altered mental status Active Problems:   Essential hypertension   Dementia    AMS ? Tramadol  on dementia Check b12, folate, esr, ana, tsh, rpr, ammonia Check MRI brain  CXR  Dehydration Hydrate w ns iv  Carotid stenosis Check carotid ultrasound  Hypertension Cont amlodipine, cont hydrochlorothiazide  Hyperlpidemia Cont  pravastatin  Hypothyroidism Cont levothyroxine   DVT Prophylaxis Lovenox - SCDs   AM Labs Ordered, also please review Full Orders  Family Communication: Admission, patients condition and plan of care including tests being ordered have been discussed with the patient and daughter who indicate understanding and agree with the plan and Code Status.  Code Status FULL CODE  Likely DC to  home  Condition GUARDED    Consults called: none  Admission status: observation   Time spent in minutes : 45   Jani Gravel M.D on 04/12/2017 at 12:08 AM  Between 7am to 7pm - Pager - [phone]. After 7pm go to [email or web address] - password [facility]  [facility] - Office  [phone]

## 2017-04-12 NOTE — Progress Notes (Signed)
Pt continues to remove velcro straps on mitts using her teeth, pulling on pulse ox cord, attempting to get out of bed even with bed alarms on & Tele sitter prompts are insufficient

## 2017-04-12 NOTE — Progress Notes (Signed)
PT is extremely irritable and agitated. She stated "I want to go home." PT hit and kicked at staff and tried to leave room. Pt is hallucinating and stated "there are little kids in the corner of the room." Daughter came in to stay with her briefly.

## 2017-04-13 DIAGNOSIS — G9341 Metabolic encephalopathy: Secondary | ICD-10-CM

## 2017-04-13 LAB — BASIC METABOLIC PANEL
ANION GAP: 15 (ref 5–15)
BUN: 20 mg/dL (ref 6–20)
CALCIUM: 9.1 mg/dL (ref 8.9–10.3)
CO2: 22 mmol/L (ref 22–32)
Chloride: 103 mmol/L (ref 101–111)
Creatinine, Ser: 1.02 mg/dL — ABNORMAL HIGH (ref 0.44–1.00)
GFR calc Af Amer: 56 mL/min — ABNORMAL LOW (ref >60)
GFR, EST NON AFRICAN AMERICAN: 48 mL/min — AB (ref >60)
GLUCOSE: 116 mg/dL — AB (ref 65–99)
POTASSIUM: 3.4 mmol/L — AB (ref 3.5–5.1)
SODIUM: 140 mmol/L (ref 135–145)

## 2017-04-13 LAB — T3, FREE: T3 FREE: 1.4 pg/mL — AB (ref 2.0–4.4)

## 2017-04-13 LAB — VAS US CAROTID
LICADDIAS: -15 cm/s
Left CCA dist dias: 16 cm/s
Left CCA dist sys: 76 cm/s
Left CCA prox dias: 192 cm/s
Left CCA prox sys: 615 cm/s
Left ICA dist sys: -74 cm/s
Left ICA prox dias: -14 cm/s
Left ICA prox sys: -65 cm/s
RCCADSYS: -63 cm/s
RCCAPDIAS: 4 cm/s
RIGHT ECA DIAS: 30 cm/s
RIGHT VERTEBRAL DIAS: 15 cm/s
Right CCA prox sys: 63 cm/s

## 2017-04-13 NOTE — Evaluation (Signed)
Clinical/Bedside Swallow Evaluation Patient Details  Name: Wanda Robinson MRN: [id number] Date of Birth: 05/22/1929  Today's Date: 04/13/2017 Time: SLP Start Time (ACUTE ONLY): 1415 SLP Stop Time (ACUTE ONLY): 1435 SLP Time Calculation (min) (ACUTE ONLY): 20 min  Past Medical History:  Past Medical History:  Diagnosis Date  . Anemia    past hx. 2011  . Aortic stenosis   . Back pain   . CAD (coronary artery disease)   . Carotid artery occlusion   . Chronic kidney disease 05-05-13   renal dysfunction- has improved from 3'11  . Colon polyps    removed with colonoscopy  . COPD (chronic obstructive pulmonary disease) ([facility])   . Dementia 05-05-13   short term memory issues"no dx"  . Gastric outlet obstruction    10'14- hospital stay of several days([facility])  . Hiatal hernia   . Hyperlipidemia   . Hypertension   . Hypothyroidism   . MI (myocardial infarction) ([facility])   . Renal artery stenosis ([facility])    Previous stenting of renal arteries bilaterally  . Ulcer    Past Surgical History:  Past Surgical History:  Procedure Laterality Date  . CAROTID ENDARTERECTOMY Left 2011   Dr. Darrick PennaFields  . CHOLECYSTECTOMY     open  . CORONARY ARTERY BYPASS GRAFT  2002   Dr Dorris FetchHendrickson  . GASTROSTOMY N/A 05/12/2013   Procedure: G TUBE PLACEMENT;  Surgeon: Axel FillerArmando Ramirez, MD;  Location: [facility];  Service: General;  Laterality: N/A;  . HIATAL HERNIA REPAIR N/A 05/12/2013   Procedure: LAPAROSCOPIC REPAIR OF HIATAL HERNIA, ;  Surgeon: Axel FillerArmando Ramirez, MD;  Location: [facility];  Service: General;  Laterality: N/A;  . INSERTION OF MESH N/A 05/12/2013   Procedure: INSERTION OF MESH;  Surgeon: Axel FillerArmando Ramirez, MD;  Location: [facility];  Service: General;  Laterality: N/A;   HPI:  83 year old female admitted 04/11/17 due to back pain and AMS. PMH significant for dementia, carotid stenosis, aortic stenosis, hypothyroid. No history of swallowing difficulty. RN reports pt tolerated meds today without difficulty., MRI  and CXR are both negative.    Assessment / Plan / Recommendation Clinical Impression  Patient presents with mild risk for aspiration secondary to altered mental status. She is awake, though slightly drowsy upon SLP arrival. Rouses with min cues, follows all basic commands. Processing noted to be slow. Requires some assistance for bolus retrieval due to decreased awareness. With larger straw bolus of thin liquids, pt coughed x1, suggestive of reduced airway protection. No further overt signs of aspiration with thin liquids, pureed or regular solids. At this time, pt's cognitive status is greatest risk for aspiration. Recommend regular diet with thin liquids, no straws, meds whole in puree, supervision/assistance for self-feeding. Avoid mixed consistencies. SLP will follow briefly for tolerance, reassess appropriateness of straws, mixed consistencies as her mentation improves.  SLP Visit Diagnosis: Dysphagia, unspecified (R13.10)    Aspiration Risk  Mild aspiration risk    Diet Recommendation Regular;Thin liquid   Liquid Administration via: Cup;No straw Medication Administration: Whole meds with puree Supervision: Patient able to self feed;Full supervision/cueing for compensatory strategies Compensations: Slow rate;Small sips/bites;Minimize environmental distractions;Follow solids with liquid Postural Changes: Seated upright at 90 degrees    Other  Recommendations Oral Care Recommendations: Oral care BID   Follow up Recommendations None      Frequency and Duration min 2x/week  1 week       Prognosis Prognosis for Safe Diet Advancement: Good Barriers to Reach Goals: Cognitive deficits  Swallow Study   General Date of Onset: 04/12/17 HPI: 83 year old female admitted 04/11/17 due to back pain and AMS. PMH significant for dementia, carotid stenosis, aortic stenosis, hypothyroid. No history of swallowing difficulty. RN reports pt tolerated meds today without difficulty., MRI and CXR  are both negative.  Type of Study: Bedside Swallow Evaluation Previous Swallow Assessment: none in chart Diet Prior to this Study: Regular;Thin liquids Temperature Spikes Noted: No Respiratory Status: Room air History of Recent Intubation: No Behavior/Cognition: Cooperative;Pleasant mood;Lethargic/Drowsy Oral Cavity Assessment: Within Functional Limits Oral Care Completed by SLP: No Oral Cavity - Dentition: Dentures, top;Adequate natural dentition Vision: Functional for self-feeding Self-Feeding Abilities: Able to feed self Patient Positioning: Upright in chair Baseline Vocal Quality: Low vocal intensity Volitional Cough: Strong Volitional Swallow: Able to elicit    Oral/Motor/Sensory Function Overall Oral Motor/Sensory Function: Within functional limits   Ice Chips Ice chips: Within functional limits Presentation: Spoon   Thin Liquid Thin Liquid: Impaired Presentation: Straw;Cup;Self Fed Oral Phase Impairments: Reduced labial seal Oral Phase Functional Implications: Other (comment) (midline anterior spillage) Pharyngeal  Phase Impairments: Cough - Immediate    Nectar Thick Nectar Thick Liquid: Not tested   Honey Thick Honey Thick Liquid: Not tested   Puree Puree: Within functional limits Presentation: Self Fed;Spoon   Solid   GO   Solid: Within functional limits Presentation: Self Fed       Wanda Baton, MS, [employer] Speech-Language Pathologist [phone]  Wanda Robinson 04/13/2017,2:52 PM

## 2017-04-13 NOTE — Progress Notes (Addendum)
Patient ID: Wanda Robinson, female   DOB: 1928/10/01, 83 y.o.   MRN: [id number]  PROGRESS NOTE    Wanda Robinson  GEX:[id number] DOB: [birth year] DOA: 04/11/2017  PCP: Bailey Mech, PA-C   Brief Narrative:  83 year old female with history of dementia, hypertension, hypothyroidism and dyslipidemia who presented to ED with worsening hallucinations and altered mental status. CT scan was unremarkable on the admission and no evidence of acute infection on admission.  Assessment & Plan:   Principal Problem: Acute metabolic encephalopathy / dementia with behavioral disturbance - No acute findings on MRI brain - Likely secondary to dementia - Currently patient is calm, not agitated or restless - Continue to monitor her mental status - Continue Seroquel  Active Problems:   Essential hypertension - continue Norvasc    Hypothyroidism  - Continue Synthroid - Spoke with patient's daughter at the bedside about rechecking TSH level in about a month    Dyslipidemia - Continue Pravachol 40 mg daily    Hypokalemia  - Continue supplementation through IV fluids   DVT prophylaxis: Lovenox subQ Code Status: full code  Family Communication: daughter at the bedside  Disposition Plan: home once pt more alert and mental status more stable    Consultants:   SLP  Procedures:   Carotid doppler 10/26   Antimicrobials:   None    Subjective: No overnight events.   Objective: Vitals:   04/12/17 0939 04/12/17 1355 04/12/17 2050 04/13/17 0627  BP: (!) 147/72 (!) 165/69    Pulse:  90 79 92  Resp:    18  Temp:  (!) 97.5 F (36.4 C)  98.5 F (36.9 C)  TempSrc:  Axillary  Axillary  SpO2: 98% 94% 97%   Weight:      Height:        Intake/Output Summary (Last 24 hours) at 04/13/17 1113 Last data filed at 04/13/17 0557  Gross per 24 hour  Intake           1587.5 ml  Output                0 ml  Net           1587.5 ml   Filed Weights   04/11/17 1729  Weight: 62.1 kg  (137 lb)    Examination:  General exam: Appears calm and comfortable  Respiratory system: Clear to auscultation. Respiratory effort normal. Cardiovascular system: S1 & S2 heard, RRR. No JVD, murmurs, rubs, gallops or clicks. No pedal edema. Gastrointestinal system: Abdomen is nondistended, soft and nontender. No organomegaly or masses felt. Normal bowel sounds heard. Central nervous system: Alert and oriented. No focal neurological deficits. Extremities: Symmetric 5 x 5 power. Skin: No rashes, lesions or ulcers Psychiatry: Judgement and insight appear normal. Mood & affect appropriate.   Data Reviewed: I have personally reviewed following labs and imaging studies  CBC:  Recent Labs Lab 04/11/17 2000 04/12/17 0541  WBC 6.8 6.7  HGB 15.5* 16.8*  HCT 45.2 48.3*  MCV 89.7 88.6  PLT 209 220   Basic Metabolic Panel:  Recent Labs Lab 04/11/17 2000 04/12/17 0541 04/13/17 0610  NA 138 139 140  K 4.2 3.5 3.4*  CL 99* 100* 103  CO2 24 24 22   GLUCOSE 106* 112* 116*  BUN 26* 21* 20  CREATININE 1.17* 1.01* 1.02*  CALCIUM 9.7 9.9 9.1   GFR: Estimated Creatinine Clearance: 34.3 mL/min (A) (by C-G formula based on SCr of 1.02 mg/dL (H)). Liver Function Tests:  Recent  Labs Lab 04/11/17 2000 04/12/17 0541  AST 28 27  ALT 16 17  ALKPHOS 58 59  BILITOT 1.0 1.2  PROT 7.3 7.7  ALBUMIN 4.3 4.5   No results for input(s): LIPASE, AMYLASE in the last 168 hours. No results for input(s): AMMONIA in the last 168 hours. Coagulation Profile: No results for input(s): INR, PROTIME in the last 168 hours. Cardiac Enzymes: No results for input(s): CKTOTAL, CKMB, CKMBINDEX, TROPONINI in the last 168 hours. BNP (last 3 results) No results for input(s): PROBNP in the last 8760 hours. HbA1C: No results for input(s): HGBA1C in the last 72 hours. CBG:  Recent Labs Lab 04/11/17 1740  GLUCAP 97   Lipid Profile: No results for input(s): CHOL, HDL, LDLCALC, TRIG, CHOLHDL, LDLDIRECT in  the last 72 hours. Thyroid Function Tests:  Recent Labs  04/12/17 0556 04/12/17 1335  TSH 62.468*  --   FREET4  --  0.97  T3FREE  --  1.4*   Anemia Panel:  Recent Labs  04/12/17 0556  VITAMINB12 387  FOLATE 17.3   Urine analysis:    Component Value Date/Time   COLORURINE YELLOW 04/11/2017 1922   APPEARANCEUR CLEAR 04/11/2017 1922   LABSPEC 1.012 04/11/2017 1922   PHURINE 6.0 04/11/2017 1922   GLUCOSEU NEGATIVE 04/11/2017 1922   HGBUR NEGATIVE 04/11/2017 1922   HGBUR negative 01/19/2008 1008   BILIRUBINUR NEGATIVE 04/11/2017 1922   KETONESUR 5 (A) 04/11/2017 1922   PROTEINUR 100 (A) 04/11/2017 1922   UROBILINOGEN 1.0 07/04/2014 1447   NITRITE NEGATIVE 04/11/2017 1922   LEUKOCYTESUR NEGATIVE 04/11/2017 1922   Sepsis Labs: @LABRCNTIP (procalcitonin:4,lacticidven:4)   )No results found for this or any previous visit (from the past 240 hour(s)).    Radiology Studies: Ct Head Wo Contrast  Result Date: 04/11/2017 CLINICAL DATA:  Acute onset of hallucinations and confusion. Severe headache. Initial encounter. EXAM: CT HEAD WITHOUT CONTRAST TECHNIQUE: Contiguous axial images were obtained from the base of the skull through the vertex without intravenous contrast. COMPARISON:  CT of the head performed 06/02/2015, and MRI of the brain performed 09/04/2009 FINDINGS: Brain: No evidence of acute infarction, hemorrhage, hydrocephalus, extra-axial collection or mass lesion/mass effect. Prominence of the ventricles and sulci reflects moderate cortical volume loss. Mild cerebellar atrophy is noted. Scattered periventricular white matter change likely reflects small vessel ischemic microangiopathy. The brainstem and fourth ventricle are within normal limits. The basal ganglia are unremarkable in appearance. The cerebral hemispheres demonstrate grossly normal gray-white differentiation. No mass effect or midline shift is seen. Vascular: No hyperdense vessel or unexpected calcification.  Skull: There is no evidence of fracture; visualized osseous structures are unremarkable in appearance. Sinuses/Orbits: The visualized portions of the orbits are within normal limits. The paranasal sinuses and mastoid air cells are well-aerated. Other: No significant soft tissue abnormalities are seen. IMPRESSION: 1. No acute intracranial pathology seen on CT. 2. Moderate cortical volume loss and scattered small vessel ischemic microangiopathy. Electronically Signed   By: Roanna Raider M.D.   On: 04/11/2017 21:35   Mr Brain Wo Contrast  Result Date: 04/12/2017 CLINICAL DATA:  Altered level of consciousness. Headache and confusion EXAM: MRI HEAD WITHOUT CONTRAST TECHNIQUE: Multiplanar, multiecho pulse sequences of the brain and surrounding structures were obtained without intravenous contrast. COMPARISON:  CT head 04/11/2017 FINDINGS: Brain: Image quality is markedly degraded by motion. Diffusion-weighted imaging in both the axial and coronal plantar marginally diagnostic. Generalized atrophy.  Negative for acute infarct hemorrhage or mass. Vascular: Normal arterial flow voids. Skull and upper cervical spine:  Negative Sinuses/Orbits: Negative Other: None IMPRESSION: No acute abnormality although image quality is markedly degraded by motion. Electronically Signed   By: Marlan Palauharles  Clark M.D.   On: 04/12/2017 13:04   Ct Abdomen Pelvis W Contrast  Result Date: 04/11/2017 CLINICAL DATA:  Abdominal pain EXAM: CT ABDOMEN AND PELVIS WITH CONTRAST TECHNIQUE: Multidetector CT imaging of the abdomen and pelvis was performed using the standard protocol following bolus administration of intravenous contrast. CONTRAST:  75 mL Isovue 300 intravenous COMPARISON:  05/16/2013, 04/20/2013 FINDINGS: Lower chest: Lung bases demonstrate no acute consolidation or pleural effusion. Coronary calcification. Hepatobiliary: Re- demonstrated asymmetric biliary dilatation in the left hepatic lobe with hypodensities, likely small cysts.  Hypodensity near the falciform ligament with adjacent punctate calcification also unchanged. Surgical absence of the gallbladder. Pancreas: Unremarkable. No pancreatic ductal dilatation or surrounding inflammatory changes. Spleen: Normal in size without focal abnormality. Adrenals/Urinary Tract: Adrenal glands are within normal limits. Punctate intrarenal stones bilaterally. No hydronephrosis. Atrophy and scarring of the right kidney. Cyst in the lower pole of the right kidney. Stable subcentimeter hypodensity lower pole left kidney likely a cyst. Bladder unremarkable Stomach/Bowel: The stomach is non enlarged. The patient has had prior hiatal hernia repair. Linear density extending from body of the stomach toward the skin surface consistent with old slight of gastrostomy tube. No dilated small bowel. Sigmoid colon diverticular disease without acute inflammation Vascular/Lymphatic: Extensive atherosclerosis. Mild aneurysmal dilatation of the proximal infrarenal abdominal aorta measuring 3.3 cm. No significantly enlarged abdominal or pelvic lymph nodes. Reproductive: Uterus and bilateral adnexa are unremarkable. Other: Negative for free air or free fluid. Interval repair previously noted bowel containing left inguinal hernia. Musculoskeletal: Degenerative changes of the spine. No acute or suspicious bone lesion. IMPRESSION: 1. No CT evidence for acute intra-abdominal or pelvic abnormality. 2. No change in asymmetric biliary dilatation involving left hepatic lobe status post cholecystectomy. 3. Nonobstructing stones within both kidneys 4. 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 This measures up to 3.3 cm. Recommend followup by ultrasound in 3 years. This recommendation follows ACR consensus guidelines: White Paper of the ACR Incidental Findings Committee II on Vascular Findings. Alba DestineJ Am Coll Radiol 2013; 10:789-794 Electronically Signed   By: Jasmine PangKim  Fujinaga M.D.   On: 04/11/2017 21:57   Portable Chest  1 View  Result Date: 04/12/2017 CLINICAL DATA:  83 year old female with altered mental status. EXAM: PORTABLE CHEST 1 VIEW COMPARISON:  Chest radiograph dated 06/02/2015 FINDINGS: The lungs are clear. There is no pleural effusion or pneumothorax. Top-normal cardiac size. Median sternotomy wires, and CABG vascular clips noted. There is atherosclerotic calcification of the aortic arch. No acute osseous pathology. IMPRESSION: No active disease. Electronically Signed   By: Elgie CollardArash  Radparvar M.D.   On: 04/12/2017 03:16        Scheduled Meds: . amLODipine  10 mg Oral Q breakfast  . aspirin EC  81 mg Oral Daily  . enoxaparin (LOVENOX) injection  40 mg Subcutaneous Q24H  . levothyroxine  75 mcg Intravenous QAC breakfast  . pravastatin  40 mg Oral Daily  . QUEtiapine  25 mg Oral QHS   Continuous Infusions: . 0.9 % NaCl with KCl 20 mEq / L 75 mL/hr at 04/12/17 2203     LOS: 1 day    Time spent: 25 minutes  Greater than 50% of the time spent on counseling and coordinating the care.   Manson PasseyAlma Devine, MD [facility] Pager [phone]  If 7PM-7AM, please contact night-coverage [email or web address] Password [email or web address] 04/13/2017, 11:13 AM

## 2017-04-13 NOTE — Progress Notes (Signed)
Patient brady down to 40s. Patient sleeping soundly, daughter reports she hasn't slept in 2 days. Patient does arouse and seems to be asymptomatic. Resp 20. Last night has sustained periods in 30s, history of heart block. Alarms were set to 30s last night. Will c/t monitor.

## 2017-04-14 DIAGNOSIS — I10 Essential (primary) hypertension: Secondary | ICD-10-CM

## 2017-04-14 LAB — BASIC METABOLIC PANEL
ANION GAP: 8 (ref 5–15)
BUN: 27 mg/dL — ABNORMAL HIGH (ref 6–20)
CALCIUM: 8.7 mg/dL — AB (ref 8.9–10.3)
CO2: 22 mmol/L (ref 22–32)
Chloride: 112 mmol/L — ABNORMAL HIGH (ref 101–111)
Creatinine, Ser: 1.25 mg/dL — ABNORMAL HIGH (ref 0.44–1.00)
GFR, EST AFRICAN AMERICAN: 44 mL/min — AB (ref >60)
GFR, EST NON AFRICAN AMERICAN: 38 mL/min — AB (ref >60)
Glucose, Bld: 91 mg/dL (ref 65–99)
POTASSIUM: 3.5 mmol/L (ref 3.5–5.1)
SODIUM: 142 mmol/L (ref 135–145)

## 2017-04-14 LAB — CBC
HCT: 40.2 % (ref 36.0–46.0)
Hemoglobin: 13.8 g/dL (ref 12.0–15.0)
MCH: 30.3 pg (ref 26.0–34.0)
MCHC: 34.3 g/dL (ref 30.0–36.0)
MCV: 88.4 fL (ref 78.0–100.0)
PLATELETS: 187 10*3/uL (ref 150–400)
RBC: 4.55 MIL/uL (ref 3.87–5.11)
RDW: 15.1 % (ref 11.5–15.5)
WBC: 8.9 10*3/uL (ref 4.0–10.5)

## 2017-04-14 MED ORDER — LEVOTHYROXINE SODIUM 150 MCG PO TABS
150.0000 ug | ORAL_TABLET | Freq: Every day | ORAL | Status: DC
  Administered 2017-04-15: 150 ug via ORAL
  Filled 2017-04-14: qty 1

## 2017-04-14 MED ORDER — ENOXAPARIN SODIUM 30 MG/0.3ML ~~LOC~~ SOLN
30.0000 mg | SUBCUTANEOUS | Status: DC
  Administered 2017-04-14 – 2017-04-15 (×2): 30 mg via SUBCUTANEOUS
  Filled 2017-04-14 (×2): qty 0.3

## 2017-04-14 MED ORDER — SODIUM CHLORIDE 0.9 % IV SOLN
INTRAVENOUS | Status: DC
  Administered 2017-04-14: 10:00:00 via INTRAVENOUS
  Filled 2017-04-14 (×3): qty 1000

## 2017-04-14 NOTE — Progress Notes (Signed)
Patient ID: ANH BIGOS, female   DOB: 03-08-1929, 83 y.o.   MRN: [id number]  PROGRESS NOTE    Wanda Robinson  WUJ:[id number] DOB: 25-Nov-1928 DOA: 04/11/2017  PCP: Bailey Mech, PA-C   Brief Narrative:  83 year old female with history of dementia, hypertension, hypothyroidism and dyslipidemia who presented to ED with worsening hallucinations and altered mental status. CT scan was unremarkable on the admission and no evidence of acute infection on admission.  Assessment & Plan:   Principal Problem: Acute metabolic encephalopathy / dementia with behavioral disturbance - likely secondary to dementia - No acute findings on MRI brain - Much better mental status this morning - Patient is oriented to herself but not oriented to time or place - obtain physical therapy - Continue Seroquel  Active Problems:   Essential hypertension - continue Norvasc    Hypothyroidism  - continue Synthroid, recheck TSH in one month on outpatient basis    Dyslipidemia - continue Pravachol    Hypokalemia  - potassium supplemented through IV fluids and now within normal limits   DVT prophylaxis: Lovenox subQ Code Status: full code  Family Communication: daughter at bedside  Disposition Plan: needs PT eval   Consultants:   None   Procedures:   Carotid doppler 10/26 - pending   Antimicrobials:   None    Subjective: No overnight events.  Objective: Vitals:   04/13/17 1311 04/13/17 1743 04/13/17 2119 04/14/17 0610  BP: (!) 143/77  (!) 172/65 (!) 171/45  Pulse: 76  79 66  Resp: 18   16  Temp: 98.2 F (36.8 C) 98.2 F (36.8 C)  (!) 97.5 F (36.4 C)  TempSrc: Oral   Oral  SpO2: 97%  97% 98%  Weight:    62.7 kg (138 lb 4.8 oz)  Height:        Intake/Output Summary (Last 24 hours) at 04/14/17 0934 Last data filed at 04/14/17 9562  Gross per 24 hour  Intake          2218.75 ml  Output                0 ml  Net          2218.75 ml   Filed Weights   04/11/17 1729  04/14/17 0610  Weight: 62.1 kg (137 lb) 62.7 kg (138 lb 4.8 oz)    Physical Exam  Constitutional: Appears well-developed and well-nourished. No distress.  CVS: RRR, S1/S2 +, has murmur present +3/6  Pulmonary: Effort and breath sounds normal, no stridor, rhonchi, wheezes, rales.  Abdominal: Soft. BS +,  no distension, tenderness, rebound or guarding.  Musculoskeletal: Normal range of motion. No edema and no tenderness.  Lymphadenopathy: No lymphadenopathy noted, cervical, inguinal. Neuro: Alert. Normal reflexes, muscle tone coordination. No cranial nerve deficit. Skin: Skin is warm and dry.  Psychiatric: Normal mood and affect.     Data Reviewed: I have personally reviewed following labs and imaging studies  CBC:  Recent Labs Lab 04/11/17 2000 04/12/17 0541 04/14/17 0602  WBC 6.8 6.7 8.9  HGB 15.5* 16.8* 13.8  HCT 45.2 48.3* 40.2  MCV 89.7 88.6 88.4  PLT 209 220 187   Basic Metabolic Panel:  Recent Labs Lab 04/11/17 2000 04/12/17 0541 04/13/17 0610 04/14/17 0602  NA 138 139 140 142  K 4.2 3.5 3.4* 3.5  CL 99* 100* 103 112*  CO2 24 24 22 22   GLUCOSE 106* 112* 116* 91  BUN 26* 21* 20 27*  CREATININE 1.17* 1.01* 1.02* 1.25*  CALCIUM 9.7 9.9 9.1 8.7*   GFR: Estimated Creatinine Clearance: 28.1 mL/min (A) (by C-G formula based on SCr of 1.25 mg/dL (H)). Liver Function Tests:  Recent Labs Lab 04/11/17 2000 04/12/17 0541  AST 28 27  ALT 16 17  ALKPHOS 58 59  BILITOT 1.0 1.2  PROT 7.3 7.7  ALBUMIN 4.3 4.5   No results for input(s): LIPASE, AMYLASE in the last 168 hours. No results for input(s): AMMONIA in the last 168 hours. Coagulation Profile: No results for input(s): INR, PROTIME in the last 168 hours. Cardiac Enzymes: No results for input(s): CKTOTAL, CKMB, CKMBINDEX, TROPONINI in the last 168 hours. BNP (last 3 results) No results for input(s): PROBNP in the last 8760 hours. HbA1C: No results for input(s): HGBA1C in the last 72  hours. CBG:  Recent Labs Lab 04/11/17 1740  GLUCAP 97   Lipid Profile: No results for input(s): CHOL, HDL, LDLCALC, TRIG, CHOLHDL, LDLDIRECT in the last 72 hours. Thyroid Function Tests:  Recent Labs  04/12/17 0556 04/12/17 1335  TSH 62.468*  --   FREET4  --  0.97  T3FREE  --  1.4*   Anemia Panel:  Recent Labs  04/12/17 0556  VITAMINB12 387  FOLATE 17.3   Urine analysis:    Component Value Date/Time   COLORURINE YELLOW 04/11/2017 1922   APPEARANCEUR CLEAR 04/11/2017 1922   LABSPEC 1.012 04/11/2017 1922   PHURINE 6.0 04/11/2017 1922   GLUCOSEU NEGATIVE 04/11/2017 1922   HGBUR NEGATIVE 04/11/2017 1922   HGBUR negative 01/19/2008 1008   BILIRUBINUR NEGATIVE 04/11/2017 1922   KETONESUR 5 (A) 04/11/2017 1922   PROTEINUR 100 (A) 04/11/2017 1922   UROBILINOGEN 1.0 07/04/2014 1447   NITRITE NEGATIVE 04/11/2017 1922   LEUKOCYTESUR NEGATIVE 04/11/2017 1922   Sepsis Labs: @LABRCNTIP (procalcitonin:4,lacticidven:4)   )No results found for this or any previous visit (from the past 240 hour(s)).    Radiology Studies: Ct Head Wo Contrast  Result Date: 04/11/2017 CLINICAL DATA:  Acute onset of hallucinations and confusion. Severe headache. Initial encounter. EXAM: CT HEAD WITHOUT CONTRAST TECHNIQUE: Contiguous axial images were obtained from the base of the skull through the vertex without intravenous contrast. COMPARISON:  CT of the head performed 06/02/2015, and MRI of the brain performed 09/04/2009 FINDINGS: Brain: No evidence of acute infarction, hemorrhage, hydrocephalus, extra-axial collection or mass lesion/mass effect. Prominence of the ventricles and sulci reflects moderate cortical volume loss. Mild cerebellar atrophy is noted. Scattered periventricular white matter change likely reflects small vessel ischemic microangiopathy. The brainstem and fourth ventricle are within normal limits. The basal ganglia are unremarkable in appearance. The cerebral hemispheres  demonstrate grossly normal gray-white differentiation. No mass effect or midline shift is seen. Vascular: No hyperdense vessel or unexpected calcification. Skull: There is no evidence of fracture; visualized osseous structures are unremarkable in appearance. Sinuses/Orbits: The visualized portions of the orbits are within normal limits. The paranasal sinuses and mastoid air cells are well-aerated. Other: No significant soft tissue abnormalities are seen. IMPRESSION: 1. No acute intracranial pathology seen on CT. 2. Moderate cortical volume loss and scattered small vessel ischemic microangiopathy. Electronically Signed   By: Roanna Raider M.D.   On: 04/11/2017 21:35   Mr Brain Wo Contrast  Result Date: 04/12/2017 CLINICAL DATA:  Altered level of consciousness. Headache and confusion EXAM: MRI HEAD WITHOUT CONTRAST TECHNIQUE: Multiplanar, multiecho pulse sequences of the brain and surrounding structures were obtained without intravenous contrast. COMPARISON:  CT head 04/11/2017 FINDINGS: Brain: Image quality is markedly degraded by motion. Diffusion-weighted imaging  in both the axial and coronal plantar marginally diagnostic. Generalized atrophy.  Negative for acute infarct hemorrhage or mass. Vascular: Normal arterial flow voids. Skull and upper cervical spine: Negative Sinuses/Orbits: Negative Other: None IMPRESSION: No acute abnormality although image quality is markedly degraded by motion. Electronically Signed   By: Marlan Palau M.D.   On: 04/12/2017 13:04   Ct Abdomen Pelvis W Contrast  Result Date: 04/11/2017 CLINICAL DATA:  Abdominal pain EXAM: CT ABDOMEN AND PELVIS WITH CONTRAST TECHNIQUE: Multidetector CT imaging of the abdomen and pelvis was performed using the standard protocol following bolus administration of intravenous contrast. CONTRAST:  75 mL Isovue 300 intravenous COMPARISON:  05/16/2013, 04/20/2013 FINDINGS: Lower chest: Lung bases demonstrate no acute consolidation or pleural  effusion. Coronary calcification. Hepatobiliary: Re- demonstrated asymmetric biliary dilatation in the left hepatic lobe with hypodensities, likely small cysts. Hypodensity near the falciform ligament with adjacent punctate calcification also unchanged. Surgical absence of the gallbladder. Pancreas: Unremarkable. No pancreatic ductal dilatation or surrounding inflammatory changes. Spleen: Normal in size without focal abnormality. Adrenals/Urinary Tract: Adrenal glands are within normal limits. Punctate intrarenal stones bilaterally. No hydronephrosis. Atrophy and scarring of the right kidney. Cyst in the lower pole of the right kidney. Stable subcentimeter hypodensity lower pole left kidney likely a cyst. Bladder unremarkable Stomach/Bowel: The stomach is non enlarged. The patient has had prior hiatal hernia repair. Linear density extending from body of the stomach toward the skin surface consistent with old slight of gastrostomy tube. No dilated small bowel. Sigmoid colon diverticular disease without acute inflammation Vascular/Lymphatic: Extensive atherosclerosis. Mild aneurysmal dilatation of the proximal infrarenal abdominal aorta measuring 3.3 cm. No significantly enlarged abdominal or pelvic lymph nodes. Reproductive: Uterus and bilateral adnexa are unremarkable. Other: Negative for free air or free fluid. Interval repair previously noted bowel containing left inguinal hernia. Musculoskeletal: Degenerative changes of the spine. No acute or suspicious bone lesion. IMPRESSION: 1. No CT evidence for acute intra-abdominal or pelvic abnormality. 2. No change in asymmetric biliary dilatation involving left hepatic lobe status post cholecystectomy. 3. Nonobstructing stones within both kidneys 4. 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 This measures up to 3.3 cm. Recommend followup by ultrasound in 3 years. This recommendation follows ACR consensus guidelines: White Paper of the ACR Incidental  Findings Committee II on Vascular Findings. [employer] Dest[employer] Coll Radiol 2013; 10:789-794 Electronically Signed   By: Jasmine Pang M.D.   On: 04/11/2017 21:57   Portable Chest 1 View  Result Date: 04/12/2017 CLINICAL DATA:  83 year old female with altered mental status. EXAM: PORTABLE CHEST 1 VIEW COMPARISON:  Chest radiograph dated 06/02/2015 FINDINGS: The lungs are clear. There is no pleural effusion or pneumothorax. Top-normal cardiac size. Median sternotomy wires, and CABG vascular clips noted. There is atherosclerotic calcification of the aortic arch. No acute osseous pathology. IMPRESSION: No active disease. Electronically Signed   By: Elgie Collard M.D.   On: 04/12/2017 03:16        Scheduled Meds: . amLODipine  10 mg Oral Q breakfast  . aspirin EC  81 mg Oral Daily  . enoxaparin (LOVENOX) injection  30 mg Subcutaneous Q24H  . levothyroxine  75 mcg Intravenous QAC breakfast  . pravastatin  40 mg Oral Daily  . QUEtiapine  25 mg Oral QHS   Continuous Infusions: . 0.9 % NaCl with KCl 20 mEq / L 75 mL/hr at 04/14/17 0153     LOS: 2 days    Time spent: 25 minutes  Greater than 50% of the time  spent on counseling and coordinating the care.   Manson PasseyAlma Carmon Sahli, MD [facility] Pager [phone]  If 7PM-7AM, please contact night-coverage [email or web address] Password [email or web address] 04/14/2017, 9:34 AM

## 2017-04-15 DIAGNOSIS — R400 Somnolence: Secondary | ICD-10-CM

## 2017-04-15 LAB — BASIC METABOLIC PANEL
Anion gap: 8 (ref 5–15)
BUN: 20 mg/dL (ref 6–20)
CALCIUM: 8.2 mg/dL — AB (ref 8.9–10.3)
CHLORIDE: 113 mmol/L — AB (ref 101–111)
CO2: 21 mmol/L — ABNORMAL LOW (ref 22–32)
CREATININE: 0.96 mg/dL (ref 0.44–1.00)
GFR calc Af Amer: >60 mL/min (ref >60)
GFR calc non Af Amer: 52 mL/min — ABNORMAL LOW (ref >60)
Glucose, Bld: 93 mg/dL (ref 65–99)
Potassium: 3.2 mmol/L — ABNORMAL LOW (ref 3.5–5.1)
SODIUM: 142 mmol/L (ref 135–145)

## 2017-04-15 LAB — CBC
HCT: 36.6 % (ref 36.0–46.0)
Hemoglobin: 12.3 g/dL (ref 12.0–15.0)
MCH: 30.4 pg (ref 26.0–34.0)
MCHC: 33.6 g/dL (ref 30.0–36.0)
MCV: 90.4 fL (ref 78.0–100.0)
PLATELETS: 152 10*3/uL (ref 150–400)
RBC: 4.05 MIL/uL (ref 3.87–5.11)
RDW: 15.5 % (ref 11.5–15.5)
WBC: 4.7 10*3/uL (ref 4.0–10.5)

## 2017-04-15 MED ORDER — SODIUM CHLORIDE 0.9 % IV SOLN: INTRAVENOUS | Status: DC

## 2017-04-15 MED ORDER — POTASSIUM CHLORIDE CRYS ER 20 MEQ PO TBCR
40.0000 meq | EXTENDED_RELEASE_TABLET | Freq: Once | ORAL | Status: AC
  Administered 2017-04-15: 40 meq via ORAL
  Filled 2017-04-15: qty 2

## 2017-04-15 NOTE — Care Management Important Message (Signed)
Important Message  Patient Details  Name: Wanda Robinson MRN: [id number] Date of Birth: [birth year]   Medicare Important Message Given:  Yes    Caren MacadamFuller, Shalom Ware 04/15/2017, 11:48 AMImportant Message  Patient Details  Name: Wanda Robinson MRN: [id number] Date of Birth: [birth year]   Medicare Important Message Given:  Yes    Caren MacadamFuller, Catie Chiao 04/15/2017, 11:48 AM

## 2017-04-15 NOTE — Discharge Instructions (Signed)
Delirium Delirium is a state of mental confusion. It comes on quickly and causes significant changes in a person's thinking and behavior. People with delirium usually have trouble paying attention to what is going on or knowing where they are. They may become very withdrawn or very emotional and unable to sit still. They may even see or feel things that are not there (hallucinations). Delirium is a sign of a serious underlying medical condition. What are the causes? Delirium occurs when something suddenly affects the signals that the brain sends out. Brain signals can be affected by anything that puts severe stress on the body and brain and causes brain chemicals to be out of balance. The most common causes of delirium include:  Infections. These may be bacterial, viral, fungal, or protozoal.  Medicines. These include many over-the-counter and prescription medicines.  Recreational drugs.  Substance withdrawal. This occurs with sudden discontinuation of alcohol, certain medicines, or recreational drugs.  Surgery.  Sudden vascular events, such as stroke, brain hemorrhage, and severe migraine.  Other brain disorders, such as tumors, seizures, and physical head trauma.  Metabolic disorders, such as kidney or liver failure.  Low blood oxygen (anoxia). This may occur with lung disease, cardiac arrest, or carbon monoxide poisoning.  Hormone imbalances (endocrinopathies), such as an overactive thyroid (hyperthyroidism) or underactive thyroid (hypothyroidism).  Vitamin deficiencies.  What increases the risk? This condition is more likely to develop in:  Children.  Older people.  People who live alone.  People who have vision loss or hearing loss.  People who have existing brain disease, such as dementia.  People who have long-lasting (chronic) medical conditions, such as heart disease.  People who are hospitalized for long periods of time.  What are the signs or symptoms? Delirium  starts with a sudden change in a person's thinking or behavior. Symptoms come and go (fluctuate) over time, and they are often worse at the end of the day. Symptoms include:  Not being able to stay awake (drowsiness) or pay attention.  Being confused about places, time, and people.  Forgetfulness.  Having extreme energy levels. These may be low or high.  Changes in sleep patterns.  Extreme mood swings, such as anger or anxiety.  Focusing on things or ideas that are not important.  Rambling and senseless talking.  Difficulty speaking, understanding speech, or both.  Hallucinations.  Tremor or unsteady gait.  How is this diagnosed? People with delirium may not realize that they have the condition. Often, a family member or health care provider is the first person to notice the changes. The health care provider will obtain a detailed history of current symptoms, medical issues, medicines, and recreational drug use. The health care provider will perform a mental status examination by:  Asking questions to check for confusion.  Watching for abnormal behavior.  The health care provider may perform a physical exam and order lab tests or additional studies to determine the cause of the delirium. How is this treated? Treatment of delirium depends on the cause and severity. Delirium usually goes away within days or weeks of treating the underlying cause. In the meantime, the person should not be left alone because he or she may accidentally cause self-harm. Treatment includes supportive care, such as:  Increased light during the day and decreased light at night.  Low noise level.  Uninterrupted sleep.  A regular daily schedule.  Clocks and calendars to help with orientation.  Familiar objects, including the person's pictures and clothing.  Frequent visits   from familiar family and friends.  Healthy diet.  Exercise.  In more severe cases of delirium, medicine may be  prescribed to help the person to keep calm and think more clearly. Follow these instructions at home:  Any supportive care should be continued as told by the health care provider.  All medicines should be used as told by the health care provider. This is important.  The health care provider should be consulted before over-the-counter medicines, herbs, or supplements are used.  All follow-up visits should be kept as told by the health care provider. This is important.  Alcohol and recreational drugs should be avoided as told by the health care provider. Contact a health care provider if:  Symptoms do not get better or they become worse.  New symptoms of delirium develop.  Caring for the person at home does not seem safe.  Eating, drinking, or communicating stops.  There are side effects of medicines, such as changes in sleep patterns, dizziness, weight gain, restlessness, movement changes, or tremors. Get help right away if:  Serious thoughts occur about self-harm or about hurting others.  There are serious side effects of medicine, such as: ? Swelling of the face, lips, tongue, or throat. ? Fever, confusion, muscle spasms, or seizures. This information is not intended to replace advice given to you by your health care provider. Make sure you discuss any questions you have with your health care provider. Document Released: 02/27/2012 Document Revised: 11/10/2015 Document Reviewed: 07/28/2014 Elsevier Interactive Patient Education  2018 [employer]  

## 2017-04-15 NOTE — Discharge Summary (Signed)
Physician Discharge Summary  Wanda Robinson ZOX:[id number] DOB: 1928-11-06 DOA: 04/11/2017  PCP: Bailey Mech, PA-C Admit date: 04/11/2017 Discharge date: 04/15/2017  Recommendations for Outpatient Follow-up:  1. TSH follow up in 1-2 weeks on outpt basis   Discharge Diagnoses:  Principal Problem:   Altered mental status Active Problems:   Essential hypertension   Dementia    Discharge Condition: stable   Diet recommendation: as tolerated   History of present illness:  83 year old female with history of dementia, hypertension, hypothyroidism and dyslipidemia who presented to ED with worsening hallucinations and altered mental status. CT scan was unremarkable on the admission and no evidence of acute infection on admission.  Hospital course   Assessment & Plan:   Principal Problem: Acute metabolic encephalopathy / dementia with behavioral disturbance - likely secondary to dementia - No acute findings on MRI brain - Good mental status this am - Per PT - HH PT recommended   Active Problems:   Essential hypertension - Continue home meds    Hypothyroidism  - TSH to be followed on outpt basis - Per family, TSH about a week ago was WNL - Continue current synthroid dose until TSH is re-checked and PCP can make changes to dose if needed     Dyslipidemia - continue Pravachol    Hypokalemia  - Supplemented    DVT prophylaxis: Lovenox subQ Code Status: full code  Family Communication: multiple family members in the room     Consultants:   PT  Procedures:   Carotid doppler 10/26 - pending   Antimicrobials:   None      Signed:  Manson Passey, MD  [facility] 04/15/2017, 11:41 AM  Pager #: [phone]  Time spent in minutes: more than 30 minutes    Discharge Exam: Vitals:   04/14/17 2126 04/15/17 0641  BP: (!) 122/42   Pulse: (!) 58 63  Resp: 20 18  Temp: 99.1 F (37.3 C) 97.8 F (36.6 C)  SpO2: 94% 91%    Vitals:   04/14/17 0948 04/14/17 1500 04/14/17 2126 04/15/17 0641  BP:  (!) 159/51 (!) 122/42   Pulse:  74 (!) 58 63  Resp:  18 20 18   Temp:  98.2 F (36.8 C) 99.1 F (37.3 C) 97.8 F (36.6 C)  TempSrc:  Oral Oral Oral  SpO2: 96% 96% 94% 91%  Weight:    63.3 kg (139 lb 9.6 oz)  Height:        General: Pt is alert, follows commands appropriately, not in acute distress Cardiovascular: Regular rate and rhythm, S1/S2 +, no murmurs Respiratory: Clear to auscultation bilaterally, no wheezing, no crackles, no rhonchi Abdominal: Soft, non tender, non distended, bowel sounds +, no guarding Extremities: no edema, no cyanosis, pulses palpable bilaterally DP and PT Neuro: not fully oriented but she is more alert this am and more aware of her surroundings   Discharge Instructions  Discharge Instructions    Call MD for:  difficulty breathing, headache or visual disturbances    Complete by:  As directed    Call MD for:  redness, tenderness, or signs of infection (pain, swelling, redness, odor or green/yellow discharge around incision site)    Complete by:  As directed    Call MD for:  severe uncontrolled pain    Complete by:  As directed    Diet - low sodium heart healthy    Complete by:  As directed    Increase activity slowly    Complete by:  As  directed      Allergies as of 04/15/2017      Reactions   Codeine Phosphate Nausea Only      Medication List    STOP taking these medications   HYDROcodone-acetaminophen 5-325 MG tablet Commonly known as:  NORCO/VICODIN     TAKE these medications   acetaminophen 500 MG tablet Commonly known as:  TYLENOL Take 500 mg by mouth every 6 (six) hours as needed for moderate pain.   amLODipine 10 MG tablet Commonly known as:  NORVASC Take 10 mg by mouth daily with breakfast.   aspirin EC 81 MG tablet Take 81 mg by mouth daily.   hydrochlorothiazide 25 MG tablet Commonly known as:  HYDRODIURIL Take 25 mg by mouth daily with  breakfast.   levothyroxine 125 MCG tablet Commonly known as:  SYNTHROID, LEVOTHROID Take 125 mcg by mouth daily before breakfast.   ondansetron 4 MG tablet Commonly known as:  ZOFRAN Take 1 tablet (4 mg total) by mouth every 6 (six) hours.   pravastatin 40 MG tablet Commonly known as:  PRAVACHOL Take 40 mg by mouth daily.   traMADol 50 MG tablet Commonly known as:  ULTRAM Take 50 mg by mouth every 6 (six) hours as needed for moderate pain or severe pain.      Follow-up Information    Bailey Mechodraza, Cole Christopher, PA-C. Schedule an appointment as soon as possible for a visit.   Specialty:  Physician Assistant Contact information: [address] [place] [facility] [id number] [phone]            The results of significant diagnostics from this hospitalization (including imaging, microbiology, ancillary and laboratory) are listed below for reference.    Significant Diagnostic Studies: Ct Head Wo Contrast  Result Date: 04/11/2017 CLINICAL DATA:  Acute onset of hallucinations and confusion. Severe headache. Initial encounter. EXAM: CT HEAD WITHOUT CONTRAST TECHNIQUE: Contiguous axial images were obtained from the base of the skull through the vertex without intravenous contrast. COMPARISON:  CT of the head performed 06/02/2015, and MRI of the brain performed 09/04/2009 FINDINGS: Brain: No evidence of acute infarction, hemorrhage, hydrocephalus, extra-axial collection or mass lesion/mass effect. Prominence of the ventricles and sulci reflects moderate cortical volume loss. Mild cerebellar atrophy is noted. Scattered periventricular white matter change likely reflects small vessel ischemic microangiopathy. The brainstem and fourth ventricle are within normal limits. The basal ganglia are unremarkable in appearance. The cerebral hemispheres demonstrate grossly normal gray-white differentiation. No mass effect or midline shift is seen. Vascular: No hyperdense vessel or  unexpected calcification. Skull: There is no evidence of fracture; visualized osseous structures are unremarkable in appearance. Sinuses/Orbits: The visualized portions of the orbits are within normal limits. The paranasal sinuses and mastoid air cells are well-aerated. Other: No significant soft tissue abnormalities are seen. IMPRESSION: 1. No acute intracranial pathology seen on CT. 2. Moderate cortical volume loss and scattered small vessel ischemic microangiopathy. Electronically Signed   By: Roanna RaiderJeffery  Chang M.D.   On: 04/11/2017 21:35   Mr Brain Wo Contrast  Result Date: 04/12/2017 CLINICAL DATA:  Altered level of consciousness. Headache and confusion EXAM: MRI HEAD WITHOUT CONTRAST TECHNIQUE: Multiplanar, multiecho pulse sequences of the brain and surrounding structures were obtained without intravenous contrast. COMPARISON:  CT head 04/11/2017 FINDINGS: Brain: Image quality is markedly degraded by motion. Diffusion-weighted imaging in both the axial and coronal plantar marginally diagnostic. Generalized atrophy.  Negative for acute infarct hemorrhage or mass. Vascular: Normal arterial flow voids. Skull and upper cervical  spine: Negative Sinuses/Orbits: Negative Other: None IMPRESSION: No acute abnormality although image quality is markedly degraded by motion. Electronically Signed   By: Marlan Palau M.D.   On: 04/12/2017 13:04   Ct Abdomen Pelvis W Contrast  Result Date: 04/11/2017 CLINICAL DATA:  Abdominal pain EXAM: CT ABDOMEN AND PELVIS WITH CONTRAST TECHNIQUE: Multidetector CT imaging of the abdomen and pelvis was performed using the standard protocol following bolus administration of intravenous contrast. CONTRAST:  75 mL Isovue 300 intravenous COMPARISON:  05/16/2013, 04/20/2013 FINDINGS: Lower chest: Lung bases demonstrate no acute consolidation or pleural effusion. Coronary calcification. Hepatobiliary: Re- demonstrated asymmetric biliary dilatation in the left hepatic lobe with  hypodensities, likely small cysts. Hypodensity near the falciform ligament with adjacent punctate calcification also unchanged. Surgical absence of the gallbladder. Pancreas: Unremarkable. No pancreatic ductal dilatation or surrounding inflammatory changes. Spleen: Normal in size without focal abnormality. Adrenals/Urinary Tract: Adrenal glands are within normal limits. Punctate intrarenal stones bilaterally. No hydronephrosis. Atrophy and scarring of the right kidney. Cyst in the lower pole of the right kidney. Stable subcentimeter hypodensity lower pole left kidney likely a cyst. Bladder unremarkable Stomach/Bowel: The stomach is non enlarged. The patient has had prior hiatal hernia repair. Linear density extending from body of the stomach toward the skin surface consistent with old slight of gastrostomy tube. No dilated small bowel. Sigmoid colon diverticular disease without acute inflammation Vascular/Lymphatic: Extensive atherosclerosis. Mild aneurysmal dilatation of the proximal infrarenal abdominal aorta measuring 3.3 cm. No significantly enlarged abdominal or pelvic lymph nodes. Reproductive: Uterus and bilateral adnexa are unremarkable. Other: Negative for free air or free fluid. Interval repair previously noted bowel containing left inguinal hernia. Musculoskeletal: Degenerative changes of the spine. No acute or suspicious bone lesion. IMPRESSION: 1. No CT evidence for acute intra-abdominal or pelvic abnormality. 2. No change in asymmetric biliary dilatation involving left hepatic lobe status post cholecystectomy. 3. Nonobstructing stones within both kidneys 4. 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 This measures up to 3.3 cm. Recommend followup by ultrasound in 3 years. This recommendation follows ACR consensus guidelines: White Paper of the ACR Incidental Findings Committee II on Vascular Findings. [place] [place] Coll Radiol 2013; 10:789-794 Electronically Signed   By: Jasmine Pang M.D.   On:  04/11/2017 21:57   Portable Chest 1 View  Result Date: 04/12/2017 CLINICAL DATA:  83 year old female with altered mental status. EXAM: PORTABLE CHEST 1 VIEW COMPARISON:  Chest radiograph dated 06/02/2015 FINDINGS: The lungs are clear. There is no pleural effusion or pneumothorax. Top-normal cardiac size. Median sternotomy wires, and CABG vascular clips noted. There is atherosclerotic calcification of the aortic arch. No acute osseous pathology. IMPRESSION: No active disease. Electronically Signed   By: Elgie Collard M.D.   On: 04/12/2017 03:16    Microbiology: No results found for this or any previous visit (from the past 240 hour(s)).   Labs: Basic Metabolic Panel:  Recent Labs Lab 04/11/17 2000 04/12/17 0541 04/13/17 0610 04/14/17 0602 04/15/17 0559  NA 138 139 140 142 142  K 4.2 3.5 3.4* 3.5 3.2*  CL 99* 100* 103 112* 113*  CO2 24 24 22 22  21*  GLUCOSE 106* 112* 116* 91 93  BUN 26* 21* 20 27* 20  CREATININE 1.17* 1.01* 1.02* 1.25* 0.96  CALCIUM 9.7 9.9 9.1 8.7* 8.2*   Liver Function Tests:  Recent Labs Lab 04/11/17 2000 04/12/17 0541  AST 28 27  ALT 16 17  ALKPHOS 58 59  BILITOT 1.0 1.2  PROT 7.3 7.7  ALBUMIN  4.3 4.5   No results for input(s): LIPASE, AMYLASE in the last 168 hours. No results for input(s): AMMONIA in the last 168 hours. CBC:  Recent Labs Lab 04/11/17 2000 04/12/17 0541 04/14/17 0602 04/15/17 0559  WBC 6.8 6.7 8.9 4.7  HGB 15.5* 16.8* 13.8 12.3  HCT 45.2 48.3* 40.2 36.6  MCV 89.7 88.6 88.4 90.4  PLT 209 220 187 152   Cardiac Enzymes: No results for input(s): CKTOTAL, CKMB, CKMBINDEX, TROPONINI in the last 168 hours. BNP: BNP (last 3 results) No results for input(s): BNP in the last 8760 hours.  ProBNP (last 3 results) No results for input(s): PROBNP in the last 8760 hours.  CBG:  Recent Labs Lab 04/11/17 1740  GLUCAP 97

## 2017-04-15 NOTE — Progress Notes (Signed)
Date:  April 15 2017 Chart reviewed for concurrent status and case management needs.  Will continue to follow patient progress.  Discharge Planning: following for needs  Expected discharge date: April 18, 2017  Burnett Spray, BSN, RN3, CCM   [phone]  

## 2017-04-15 NOTE — Evaluation (Signed)
Physical Therapy Evaluation Patient Details Name: Wanda Robinson MRN: [id number] DOB: 15-Jun-1929 Today's Date: 04/15/2017   History of Present Illness  83 yo female admitted with AMS, hallucinations, agitation. Hx of dementia, CAD, aortic stenosis, hernia repair 2014  Clinical Impression  On eval, pt required Min assist for mobility. She walked ~115 feet in the hallway without an assistive device. Pt is unsteady and at risk for falls. Family present at end of session. Daughter stated that she could stay with pt for a short while. Explained to daughter that pt currently requires assist for mobility. Recommended use of RW if pt will agree/remember to use it (hx of dementia). Recommend HHPT and 24 hour supervision/assist until pt returns to baseline.     Follow Up Recommendations Home health PT;Supervision/Assistance - 24 hour    Equipment Recommendations  None recommended by PT    Recommendations for Other Services       Precautions / Restrictions Precautions Precautions: Fall Restrictions Weight Bearing Restrictions: No      Mobility  Bed Mobility               General bed mobility comments: oob in recliner  Transfers Overall transfer level: Needs assistance   Transfers: Sit to/from Stand Sit to Stand: Min assist         General transfer comment: Assist to rise, stabilize.   Ambulation/Gait Ambulation/Gait assistance: Min assist Ambulation Distance (Feet): 115 Feet Assistive device: None Gait Pattern/deviations: Step-through pattern;Decreased stride length;Staggering left;Staggering right;Drifts right/left     General Gait Details: assist to stabilize throughout ambulation distance. Pt intermittently reached out for objects to hold on to/ "furniture walked". Pt tolerated distance well.   Stairs            Wheelchair Mobility    Modified Rankin (Stroke Patients Only)       Balance Overall balance assessment: Needs assistance         Standing  balance support: Bilateral upper extremity supported Standing balance-Leahy Scale: Poor                               Pertinent Vitals/Pain Pain Assessment: No/denies pain    Home Living Family/patient expects to be discharged to:: Private residence Living Arrangements: Alone Available Help at Discharge: Family;Available PRN/intermittently Type of Home: House       Home Layout: One level Home Equipment: Walker - 2 wheels      Prior Function Level of Independence: Independent               Hand Dominance        Extremity/Trunk Assessment   Upper Extremity Assessment Upper Extremity Assessment: Generalized weakness    Lower Extremity Assessment Lower Extremity Assessment: Generalized weakness    Cervical / Trunk Assessment Cervical / Trunk Assessment: Normal  Communication   Communication: No difficulties  Cognition Arousal/Alertness: Awake/alert Behavior During Therapy: WFL for tasks assessed/performed Overall Cognitive Status: History of cognitive impairments - at baseline                                        General Comments      Exercises     Assessment/Plan    PT Assessment Patient needs continued PT services  PT Problem List Decreased strength;Decreased mobility;Decreased activity tolerance;Decreased balance;Decreased knowledge of use of DME;Decreased cognition  PT Treatment Interventions DME instruction;Gait training;Therapeutic activities;Therapeutic exercise;Patient/family education;Balance training;Functional mobility training    PT Goals (Current goals can be found in the Care Plan section)  Acute Rehab PT Goals Patient Stated Goal: per daughter, home with family assisting PT Goal Formulation: With family Time For Goal Achievement: 04/29/17 Potential to Achieve Goals: Good    Frequency Min 3X/week   Barriers to discharge        Co-evaluation               AM-PAC PT "6 Clicks" Daily  Activity  Outcome Measure Difficulty turning over in bed (including adjusting bedclothes, sheets and blankets)?: A Little Difficulty moving from lying on back to sitting on the side of the bed? : A Little Difficulty sitting down on and standing up from a chair with arms (e.g., wheelchair, bedside commode, etc,.)?: A Little Help needed moving to and from a bed to chair (including a wheelchair)?: A Little Help needed walking in hospital room?: A Little Help needed climbing 3-5 steps with a railing? : A Lot 6 Click Score: 17    End of Session Equipment Utilized During Treatment: Gait belt Activity Tolerance: Patient tolerated treatment well Patient left: in chair;with call bell/phone within reach;with family/visitor present;with chair alarm set   PT Visit Diagnosis: Difficulty in walking, not elsewhere classified (R26.2);Muscle weakness (generalized) (M62.81)    Time: [id number] PT Time Calculation (min) (ACUTE ONLY): 18 min   Charges:   PT Evaluation $PT Eval Moderate Complexity: 1 Mod     PT G Codes:          Rebeca AlertJannie Anala Whisenant, MPT Pager: [phone]

## 2017-04-16 ENCOUNTER — Other Ambulatory Visit: Payer: Self-pay

## 2017-04-16 DIAGNOSIS — I714 Abdominal aortic aneurysm, without rupture, unspecified: Secondary | ICD-10-CM

## 2017-04-16 DIAGNOSIS — I6529 Occlusion and stenosis of unspecified carotid artery: Secondary | ICD-10-CM

## 2017-04-25 ENCOUNTER — Other Ambulatory Visit (HOSPITAL_COMMUNITY): Payer: Medicare Other

## 2017-04-25 ENCOUNTER — Ambulatory Visit: Payer: Medicare Other | Admitting: Vascular Surgery

## 2017-04-25 ENCOUNTER — Encounter (HOSPITAL_COMMUNITY): Payer: Medicare Other

## 2017-04-25 LAB — TB SKIN TEST: TB SKIN TEST: NEGATIVE

## 2017-05-02 LAB — TB SKIN TEST: TB Skin Test: NEGATIVE

## 2017-05-15 ENCOUNTER — Other Ambulatory Visit: Payer: Self-pay

## 2017-05-15 ENCOUNTER — Emergency Department (HOSPITAL_COMMUNITY): Payer: Medicare Other

## 2017-05-15 ENCOUNTER — Emergency Department (HOSPITAL_COMMUNITY)
Admission: EM | Admit: 2017-05-15 | Discharge: 2017-05-15 | Disposition: A | Discharge status: Home / Self Care | Payer: Medicare Other | Attending: Emergency Medicine | Admitting: Emergency Medicine

## 2017-05-15 DIAGNOSIS — F0390 Unspecified dementia without behavioral disturbance: Secondary | ICD-10-CM | POA: Insufficient documentation

## 2017-05-15 DIAGNOSIS — Y929 Unspecified place or not applicable: Secondary | ICD-10-CM | POA: Diagnosis not present

## 2017-05-15 DIAGNOSIS — S40012A Contusion of left shoulder, initial encounter: Secondary | ICD-10-CM | POA: Insufficient documentation

## 2017-05-15 DIAGNOSIS — I129 Hypertensive chronic kidney disease with stage 1 through stage 4 chronic kidney disease, or unspecified chronic kidney disease: Secondary | ICD-10-CM | POA: Insufficient documentation

## 2017-05-15 DIAGNOSIS — E039 Hypothyroidism, unspecified: Secondary | ICD-10-CM | POA: Diagnosis not present

## 2017-05-15 DIAGNOSIS — R410 Disorientation, unspecified: Secondary | ICD-10-CM | POA: Insufficient documentation

## 2017-05-15 DIAGNOSIS — S7001XA Contusion of right hip, initial encounter: Secondary | ICD-10-CM | POA: Diagnosis not present

## 2017-05-15 DIAGNOSIS — N189 Chronic kidney disease, unspecified: Secondary | ICD-10-CM | POA: Insufficient documentation

## 2017-05-15 DIAGNOSIS — W19XXXA Unspecified fall, initial encounter: Secondary | ICD-10-CM | POA: Insufficient documentation

## 2017-05-15 DIAGNOSIS — T07XXXA Unspecified multiple injuries, initial encounter: Secondary | ICD-10-CM

## 2017-05-15 DIAGNOSIS — J45909 Unspecified asthma, uncomplicated: Secondary | ICD-10-CM | POA: Diagnosis not present

## 2017-05-15 DIAGNOSIS — Y999 Unspecified external cause status: Secondary | ICD-10-CM | POA: Insufficient documentation

## 2017-05-15 DIAGNOSIS — Y939 Activity, unspecified: Secondary | ICD-10-CM | POA: Diagnosis not present

## 2017-05-15 DIAGNOSIS — S20212A Contusion of left front wall of thorax, initial encounter: Secondary | ICD-10-CM | POA: Insufficient documentation

## 2017-05-15 DIAGNOSIS — S7002XA Contusion of left hip, initial encounter: Secondary | ICD-10-CM | POA: Diagnosis not present

## 2017-05-15 DIAGNOSIS — S4992XA Unspecified injury of left shoulder and upper arm, initial encounter: Secondary | ICD-10-CM | POA: Diagnosis present

## 2017-05-15 DIAGNOSIS — K5900 Constipation, unspecified: Secondary | ICD-10-CM

## 2017-05-15 LAB — URINALYSIS, ROUTINE W REFLEX MICROSCOPIC
Bilirubin Urine: NEGATIVE
GLUCOSE, UA: NEGATIVE mg/dL
Ketones, ur: NEGATIVE mg/dL
Leukocytes, UA: NEGATIVE
Nitrite: NEGATIVE
Protein, ur: 100 mg/dL — AB
Specific Gravity, Urine: 1.018 (ref 1.005–1.030)
pH: 6 (ref 5.0–8.0)

## 2017-05-15 LAB — CBC WITH DIFFERENTIAL/PLATELET
Basophils Absolute: 0 10*3/uL (ref 0.0–0.1)
Basophils Relative: 0 %
EOS ABS: 0.1 10*3/uL (ref 0.0–0.7)
EOS PCT: 2 %
HCT: 38.5 % (ref 36.0–46.0)
Hemoglobin: 13.4 g/dL (ref 12.0–15.0)
LYMPHS ABS: 0.9 10*3/uL (ref 0.7–4.0)
Lymphocytes Relative: 12 %
MCH: 30.9 pg (ref 26.0–34.0)
MCHC: 34.8 g/dL (ref 30.0–36.0)
MCV: 88.7 fL (ref 78.0–100.0)
MONO ABS: 0.8 10*3/uL (ref 0.1–1.0)
MONOS PCT: 10 %
Neutro Abs: 5.7 10*3/uL (ref 1.7–7.7)
Neutrophils Relative %: 76 %
PLATELETS: 234 10*3/uL (ref 150–400)
RBC: 4.34 MIL/uL (ref 3.87–5.11)
RDW: 13.9 % (ref 11.5–15.5)
WBC: 7.4 10*3/uL (ref 4.0–10.5)

## 2017-05-15 LAB — I-STAT TROPONIN, ED: TROPONIN I, POC: 0.05 ng/mL (ref 0.00–0.08)

## 2017-05-15 LAB — COMPREHENSIVE METABOLIC PANEL
ALT: 21 U/L (ref 14–54)
AST: 27 U/L (ref 15–41)
Albumin: 3.5 g/dL (ref 3.5–5.0)
Alkaline Phosphatase: 68 U/L (ref 38–126)
Anion gap: 11 (ref 5–15)
BUN: 41 mg/dL — AB (ref 6–20)
CO2: 26 mmol/L (ref 22–32)
CREATININE: 1.01 mg/dL — AB (ref 0.44–1.00)
Calcium: 9.6 mg/dL (ref 8.9–10.3)
Chloride: 101 mmol/L (ref 101–111)
GFR calc Af Amer: 57 mL/min — ABNORMAL LOW (ref >60)
GFR, EST NON AFRICAN AMERICAN: 49 mL/min — AB (ref >60)
GLUCOSE: 127 mg/dL — AB (ref 65–99)
Potassium: 3.1 mmol/L — ABNORMAL LOW (ref 3.5–5.1)
Sodium: 138 mmol/L (ref 135–145)
Total Bilirubin: 1.1 mg/dL (ref 0.3–1.2)
Total Protein: 6.5 g/dL (ref 6.5–8.1)

## 2017-05-15 LAB — AMMONIA: Ammonia: 15 umol/L (ref 9–35)

## 2017-05-15 LAB — TSH: TSH: 0.115 u[IU]/mL — ABNORMAL LOW (ref 0.350–4.500)

## 2017-05-15 LAB — I-STAT CG4 LACTIC ACID, ED
Lactic Acid, Venous: 1.08 mmol/L (ref 0.5–1.9)
Lactic Acid, Venous: 0.78 mmol/L (ref 0.5–1.9)

## 2017-05-15 MED ORDER — POLYETHYLENE GLYCOL 3350 17 G PO PACK: 17.0000 g | PACK | Freq: Every day | ORAL | 0 refills | Status: DC

## 2017-05-15 NOTE — ED Triage Notes (Signed)
[facility] multiple syncopal events over past 2 days, most recent LOC resulted in a witnessed fall with trauma to R lateral eye orbit.  EMS reports staff unable to give duration of LOC, no n/v, diaphoresis noted by EMS.  Hx dementia, pt unable to remember fall or events leading up to fall.

## 2017-05-15 NOTE — ED Provider Notes (Signed)
[facility] Provider Note   CSN: [id number] Arrival date & time: 05/15/17  1549     History   Chief Complaint Chief Complaint  Patient presents with  . Fall  . Loss of Consciousness    HPI Wanda Robinson is a 83 y.o. female.  HPI Patient is brought from [facility] by EMS.  Family members report patient has been there for a couple of weeks and had decline in terms of ambulatory function.  Family members report they were told that the patient was found "sleeping" on the floor in her room yesterday.  No witnessed fall.  Patient's daughter reports that she would not sleep on her floor.  They also report that yesterday she was at lunch and ended up falling on the floor.  This was unwitnessed so it was unclear if she had a syncopal episode or a mechanical fall.  Patient cannot recall anything about this.  Patient's daughter reports that she went to take her to a doctor's appointment yesterday and they brought her mother out in a wheelchair.  She reports she had a lot of difficulty getting her to transition into the car into the doctor's office.  Patient's daughter reports that she does not normally use a wheelchair and at baseline would have been ambulatory without significant difficulty.  Patient was discharged from the hospital 4 weeks ago.  At that time she was seen for delirium and hallucinations.  She improved after that episode.  Family members expressed concern about the patient's heart murmur.  They reported that 1 of the medics was concerned about it and thought it might be a contributing factor to her recent falling episodes.  The patient is alert and pleasant.  She answers questions but has no historical recall of events.  She denies having any pain at this time and denies feeling unwell. Past Medical History:  Diagnosis Date  . Anemia    past hx. 2011  . Aortic stenosis   . Back pain   . CAD (coronary artery disease)   . Carotid artery  occlusion   . Chronic kidney disease 05-05-13   renal dysfunction- has improved from 3'11  . Colon polyps    removed with colonoscopy  . COPD (chronic obstructive pulmonary disease) (HCC)   . Dementia 05-05-13   short term memory issues"no dx"  . Gastric outlet obstruction    10'14- hospital stay of several days([facility])  . Hiatal hernia   . Hyperlipidemia   . Hypertension   . Hypothyroidism   . MI (myocardial infarction) (HCC)   . Renal artery stenosis (HCC)    Previous stenting of renal arteries bilaterally  . Ulcer     Patient Active Problem List   Diagnosis Date Noted  . Altered mental status 04/11/2017  . Acute blood loss anemia 06/05/2013  . Dementia 06/02/2013  . Cellulitis 05/16/2013  . Fever 05/16/2013  . Bradycardia 05/12/2013  . Murmur, cardiac 03/31/2013  . Gastric outlet obstruction 03/28/2013  . Hypokalemia 03/28/2013  . OSTEOPOROSIS NEC 01/15/2007  . HYPOTHYROIDISM 12/12/2006  . HYPERLIPIDEMIA 12/12/2006  . Essential hypertension 12/12/2006  . Coronary atherosclerosis 12/12/2006  . ASTHMA 12/12/2006  . COPD 12/12/2006  . GERD 12/12/2006    Past Surgical History:  Procedure Laterality Date  . CAROTID ENDARTERECTOMY Left 2011   Dr. Darrick PennaFields  . CHOLECYSTECTOMY     open  . CORONARY ARTERY BYPASS GRAFT  2002   Dr Dorris FetchHendrickson  . GASTROSTOMY N/A 05/12/2013  Procedure: G TUBE PLACEMENT;  Surgeon: Axel FillerArmando Ramirez, MD;  Location: [facility] ORS;  Service: General;  Laterality: N/A;  . HIATAL HERNIA REPAIR N/A 05/12/2013   Procedure: LAPAROSCOPIC REPAIR OF HIATAL HERNIA, ;  Surgeon: Axel FillerArmando Ramirez, MD;  Location: [facility];  Service: General;  Laterality: N/A;  . INSERTION OF MESH N/A 05/12/2013   Procedure: INSERTION OF MESH;  Surgeon: Axel FillerArmando Ramirez, MD;  Location: [facility]S;  Service: General;  Laterality: N/A;    OB History    No data available       Home Medications    Prior to Admission medications   Medication Sig Start Date End Date Taking? Authorizing  Provider  acetaminophen (TYLENOL) 500 MG tablet Take 500 mg by mouth every 6 (six) hours as needed for moderate pain.    [provider]  amLODipine (NORVASC) 10 MG tablet Take 10 mg by mouth daily with breakfast.     [provider]  aspirin EC 81 MG tablet Take 81 mg by mouth daily.    [provider]  hydrochlorothiazide (HYDRODIURIL) 25 MG tablet Take 25 mg by mouth daily with breakfast.     [provider]  levothyroxine (SYNTHROID, LEVOTHROID) 125 MCG tablet Take 125 mcg by mouth daily before breakfast.    [provider]  ondansetron (ZOFRAN) 4 MG tablet Take 1 tablet (4 mg total) by mouth every 6 (six) hours. Patient not taking: Reported on 02/03/2015 07/04/14   Phillis HaggisMabe, Martha L, MD  polyethylene glycol (MIRALAX / GLYCOLAX) packet Take 17 g by mouth daily. 05/15/17   Arby BarrettePfeiffer, Kenwood Rosiak, MD  pravastatin (PRAVACHOL) 40 MG tablet Take 40 mg by mouth daily.    [provider]  traMADol (ULTRAM) 50 MG tablet Take 50 mg by mouth every 6 (six) hours as needed for moderate pain or severe pain.  02/27/17 02/27/18  [provider]    Family History Family History  Problem Relation Age of Onset  . Heart disease Father        CAD  . Heart attack Father   . Hypertension Father   . Cancer Mother   . Cancer Sister   . Heart disease Brother        before age 81  . Heart attack Brother   . Hypertension Brother   . Cancer Son   . Hypertension Son     Social History Social History   Tobacco Use  . Smoking status: Former Smoker    Last attempt to quit: 05/05/1989    Years since quitting: 28.0  . Smokeless tobacco: Never Used  Substance Use Topics  . Alcohol use: No    Alcohol/week: 0.0 oz  . Drug use: No     Allergies   Codeine phosphate   Review of Systems Review of Systems 10 Systems reviewed and are negative for acute change except as noted in the HPI.   Physical Exam Updated Vital Signs BP (!) 124/49   Pulse 81    Resp 12   Wt 63 kg (139 lb)   SpO2 98%   BMI 25.42 kg/m   Physical Exam  Constitutional: She appears well-developed and well-nourished. No distress.  Patient is alert and in no distress.  Mental status is clear.  No respiratory distress.  HENT:  Right Ear: External ear normal.  Left Ear: External ear normal.  Nose: Nose normal.  Mouth/Throat: Oropharynx is clear and moist.  Patient has ecchymosis without significant swelling around the right lateral orbit.  No general hematoma.  Eyes: Conjunctivae and EOM are normal. Pupils are equal, round, and reactive to light.  Neck: Neck supple.  No C-spine tenderness to palpation.  Cardiovascular:  No murmur heard. Heart is normal rate with irregular rhythm.  3\6 systolic ejection murmur.  Pulmonary/Chest: Effort normal and breath sounds normal. No respiratory distress.  Abdominal: Soft. She exhibits no distension. There is tenderness. There is no guarding.  Mild suprapubic discomfort to palpation.  Musculoskeletal: Normal range of motion. She exhibits tenderness. She exhibits no edema.  Patient has contusion on the point of the left shoulder.  No visible deformity.  Patient denies any pain with range of motion.  Range of motion performed at both shoulders elbows and wrists.  Range of motion intact bilateral lower extremities with flexion and extension.  Patient did had discomfort with internal/external rotation of the right hip.  Also some tenderness over the greater trochanter on the right.  Contusions to bilateral hips. Area of approximately 5 cm of contusion, appears older on left posterior lateral chest wall.  At about ribs 5 7.  This area is nontender.  Neurological: She is alert.  Skin: Skin is warm and dry.  Psychiatric: She has a normal mood and affect.  Mood is pleasant.  Nursing note and vitals reviewed.    ED Treatments / Results  Labs (all labs ordered are listed, but only abnormal results are displayed) Labs Reviewed    COMPREHENSIVE METABOLIC PANEL - Abnormal; Notable for the following components:      Result Value   Potassium 3.1 (*)    Glucose, Bld 127 (*)    BUN 41 (*)    Creatinine, Ser 1.01 (*)    GFR calc non Af Amer 49 (*)    GFR calc Af Amer 57 (*)    All other components within normal limits  URINALYSIS, ROUTINE W REFLEX MICROSCOPIC - Abnormal; Notable for the following components:   APPearance CLOUDY (*)    Hgb urine dipstick LARGE (*)    Protein, ur 100 (*)    Bacteria, UA FEW (*)    Squamous Epithelial / LPF 0-5 (*)    All other components within normal limits  TSH - Abnormal; Notable for the following components:   TSH 0.115 (*)    All other components within normal limits  CBC WITH DIFFERENTIAL/PLATELET  AMMONIA  I-STAT TROPONIN, ED  I-STAT CG4 LACTIC ACID, ED  I-STAT CG4 LACTIC ACID, ED    EKG  EKG Interpretation None       Radiology Dg Chest 2 View  Result Date: 05/15/2017 CLINICAL DATA:  Fall, right hip pain, COPD and hypertension EXAM: CHEST  2 VIEW COMPARISON:  04/12/2017 FINDINGS: Previous coronary bypass changes. Normal heart size and vascularity. Aorta is atherosclerotic. Lungs remain clear. No focal pneumonia or edema. Negative for effusion or pneumothorax. Trachea is midline. IMPRESSION: Stable postoperative findings.  No acute chest process. Thoracic aortic atherosclerosis Electronically Signed   By: Judie Petit.  Shick M.D.   On: 05/15/2017 17:39   Ct Head Wo Contrast  Result Date: 05/15/2017 CLINICAL DATA:  Minor head trauma, altered mental status EXAM: CT HEAD WITHOUT CONTRAST TECHNIQUE: Contiguous axial images were obtained from the base of the skull through the vertex without intravenous contrast. COMPARISON:  04/11/2017, 04/12/2017 FINDINGS: Brain: Stable brain atrophy pattern without acute intracranial hemorrhage, mass lesion, new infarction, midline shift, herniation, hydrocephalus, or extra-axial fluid collection. No focal mass effect or edema. Cisterns are  patent. No cerebellar abnormality appreciated. Vascular: Intracranial atherosclerosis.  No hyperdense  vessel. Skull: Normal. Negative for fracture or focal lesion. Sinuses/Orbits: Minor right maxillary sinus polypoid mucosal thickening. Other sinuses are clear. Orbits unremarkable. Other: None. IMPRESSION: Stable chronic brain atrophy pattern without acute intracranial abnormality by noncontrast CT. No significant interval change. Electronically Signed   By: Judie Petit.  Shick M.D.   On: 05/15/2017 17:20   Dg Hip Unilat With Pelvis 2-3 Views Right  Result Date: 05/15/2017 CLINICAL DATA:  Fall, right hip pain EXAM: DG HIP (WITH OR WITHOUT PELVIS) 2-3V RIGHT COMPARISON:  04/11/2017 CT reconstructions FINDINGS: Right hip appears intact without displaced fracture or malalignment. Bones are osteopenic. Degenerative changes of the lower lumbar spine and SI joints. Iliac and femoral atherosclerosis noted. Large amount of stool burden in the rectum suggest fecal impaction. IMPRESSION: No acute osseous finding. Electronically Signed   By: Judie Petit.  Shick M.D.   On: 05/15/2017 17:40    Procedures Procedures (including critical care time)  Medications Ordered in ED Medications - No data to display   Initial Impression / Assessment and Plan / ED Course  I have reviewed the triage vital signs and the nursing notes.  Pertinent labs & imaging results that were available during my care of the patient were reviewed by me and considered in my medical decision making (see chart for details).      Final Clinical Impressions(s) / ED Diagnoses   Final diagnoses:  Fall, initial encounter  Confusion  Dementia without behavioral disturbance, unspecified dementia type  Multiple contusions  Constipation, unspecified constipation type   Patient has been having increased either falls or syncope episodes since transitioning to nursing home 2 weeks ago.  Patient does have dementia.  Family members report that when they try to  take her home from the hospital for nursing home placement, she was rarely aware that she was in her own home.  Patient is alert and pleasantly interactive.  She has no historical recall.  He has multiple contusions.  X-rays obtained on areas of discomfort.  Patient does not endorse watch for pain and can go through normal range of motion.  CT head obtained due to facial contusion and family's observation of changed level of baseline functioning.  Diagnostic evaluation does not show any acute anomalies.  X-ray suggested fecal impaction incidentally noted on pelvis x-ray.  Enema given and disimpaction initiated.  Patient has very soft stool in the vault without hard, impacted stool or dense sludge like stool.  Due to the level constipation observed on x-ray I will provide prescription for MiraLAX.  I do not think this is contributory to the patient's underlying problems.  I most suspect patient has significant dementia and is more confused and functioning at a lower level in her nursing home environment increased fall risk.  At this time, there does not appear to be acute finding suggesting need for hospitalization.  Family expressed concern for the patient's heart murmur and how that might be contributory to her decline at the nursing home.  We discussed the option of contacting cardiology to determine if placing an event monitor would be useful.  Patient is not orthostatic in the emergency department and no signs of any acute ischemic event. ED Discharge Orders        Ordered    polyethylene glycol (MIRALAX / GLYCOLAX) packet  Daily     05/15/17 2235       Arby Barrette, MD 05/15/17 2244

## 2017-05-15 NOTE — ED Notes (Signed)
Patient transported to CT 

## 2017-06-16 ENCOUNTER — Emergency Department (HOSPITAL_BASED_OUTPATIENT_CLINIC_OR_DEPARTMENT_OTHER): Payer: Medicare Other

## 2017-06-16 ENCOUNTER — Inpatient Hospital Stay (HOSPITAL_BASED_OUTPATIENT_CLINIC_OR_DEPARTMENT_OTHER)
Admission: EM | Admit: 2017-06-16 | Discharge: 2017-06-20 | DRG: 556 | Payer: Medicare Other | Attending: Internal Medicine | Admitting: Internal Medicine

## 2017-06-16 ENCOUNTER — Encounter (HOSPITAL_BASED_OUTPATIENT_CLINIC_OR_DEPARTMENT_OTHER): Payer: Self-pay | Admitting: Emergency Medicine

## 2017-06-16 ENCOUNTER — Other Ambulatory Visit: Payer: Self-pay

## 2017-06-16 DIAGNOSIS — R338 Other retention of urine: Secondary | ICD-10-CM | POA: Diagnosis not present

## 2017-06-16 DIAGNOSIS — Z885 Allergy status to narcotic agent status: Secondary | ICD-10-CM

## 2017-06-16 DIAGNOSIS — K5900 Constipation, unspecified: Secondary | ICD-10-CM | POA: Diagnosis present

## 2017-06-16 DIAGNOSIS — R778 Other specified abnormalities of plasma proteins: Secondary | ICD-10-CM | POA: Diagnosis present

## 2017-06-16 DIAGNOSIS — I771 Stricture of artery: Secondary | ICD-10-CM | POA: Diagnosis present

## 2017-06-16 DIAGNOSIS — R110 Nausea: Secondary | ICD-10-CM | POA: Diagnosis not present

## 2017-06-16 DIAGNOSIS — E039 Hypothyroidism, unspecified: Secondary | ICD-10-CM | POA: Diagnosis present

## 2017-06-16 DIAGNOSIS — Y92230 Patient room in hospital as the place of occurrence of the external cause: Secondary | ICD-10-CM | POA: Diagnosis not present

## 2017-06-16 DIAGNOSIS — T8383XA Hemorrhage of genitourinary prosthetic devices, implants and grafts, initial encounter: Secondary | ICD-10-CM | POA: Diagnosis not present

## 2017-06-16 DIAGNOSIS — R339 Retention of urine, unspecified: Secondary | ICD-10-CM | POA: Diagnosis present

## 2017-06-16 DIAGNOSIS — I129 Hypertensive chronic kidney disease with stage 1 through stage 4 chronic kidney disease, or unspecified chronic kidney disease: Secondary | ICD-10-CM | POA: Diagnosis present

## 2017-06-16 DIAGNOSIS — Z23 Encounter for immunization: Secondary | ICD-10-CM

## 2017-06-16 DIAGNOSIS — N189 Chronic kidney disease, unspecified: Secondary | ICD-10-CM | POA: Diagnosis present

## 2017-06-16 DIAGNOSIS — M79605 Pain in left leg: Secondary | ICD-10-CM | POA: Diagnosis present

## 2017-06-16 DIAGNOSIS — I10 Essential (primary) hypertension: Secondary | ICD-10-CM | POA: Diagnosis not present

## 2017-06-16 DIAGNOSIS — M545 Low back pain: Secondary | ICD-10-CM

## 2017-06-16 DIAGNOSIS — R531 Weakness: Secondary | ICD-10-CM | POA: Insufficient documentation

## 2017-06-16 DIAGNOSIS — E860 Dehydration: Secondary | ICD-10-CM | POA: Diagnosis not present

## 2017-06-16 DIAGNOSIS — I350 Nonrheumatic aortic (valve) stenosis: Secondary | ICD-10-CM | POA: Diagnosis present

## 2017-06-16 DIAGNOSIS — Z87891 Personal history of nicotine dependence: Secondary | ICD-10-CM

## 2017-06-16 DIAGNOSIS — R262 Difficulty in walking, not elsewhere classified: Secondary | ICD-10-CM | POA: Diagnosis present

## 2017-06-16 DIAGNOSIS — R748 Abnormal levels of other serum enzymes: Secondary | ICD-10-CM | POA: Diagnosis present

## 2017-06-16 DIAGNOSIS — R109 Unspecified abdominal pain: Secondary | ICD-10-CM | POA: Diagnosis present

## 2017-06-16 DIAGNOSIS — Z8719 Personal history of other diseases of the digestive system: Secondary | ICD-10-CM

## 2017-06-16 DIAGNOSIS — G8929 Other chronic pain: Secondary | ICD-10-CM | POA: Diagnosis present

## 2017-06-16 DIAGNOSIS — I441 Atrioventricular block, second degree: Secondary | ICD-10-CM | POA: Diagnosis present

## 2017-06-16 DIAGNOSIS — Z7982 Long term (current) use of aspirin: Secondary | ICD-10-CM

## 2017-06-16 DIAGNOSIS — I701 Atherosclerosis of renal artery: Secondary | ICD-10-CM | POA: Diagnosis present

## 2017-06-16 DIAGNOSIS — F05 Delirium due to known physiological condition: Secondary | ICD-10-CM | POA: Diagnosis present

## 2017-06-16 DIAGNOSIS — Z951 Presence of aortocoronary bypass graft: Secondary | ICD-10-CM

## 2017-06-16 DIAGNOSIS — I714 Abdominal aortic aneurysm, without rupture: Secondary | ICD-10-CM | POA: Diagnosis present

## 2017-06-16 DIAGNOSIS — I6529 Occlusion and stenosis of unspecified carotid artery: Secondary | ICD-10-CM | POA: Diagnosis present

## 2017-06-16 DIAGNOSIS — M8448XA Pathological fracture, other site, initial encounter for fracture: Secondary | ICD-10-CM | POA: Diagnosis present

## 2017-06-16 DIAGNOSIS — I4581 Long QT syndrome: Secondary | ICD-10-CM | POA: Diagnosis present

## 2017-06-16 DIAGNOSIS — E785 Hyperlipidemia, unspecified: Secondary | ICD-10-CM | POA: Diagnosis present

## 2017-06-16 DIAGNOSIS — R402413 Glasgow coma scale score 13-15, at hospital admission: Secondary | ICD-10-CM | POA: Diagnosis present

## 2017-06-16 DIAGNOSIS — M25559 Pain in unspecified hip: Secondary | ICD-10-CM | POA: Diagnosis present

## 2017-06-16 DIAGNOSIS — E876 Hypokalemia: Secondary | ICD-10-CM | POA: Diagnosis present

## 2017-06-16 DIAGNOSIS — F0390 Unspecified dementia without behavioral disturbance: Secondary | ICD-10-CM

## 2017-06-16 DIAGNOSIS — J449 Chronic obstructive pulmonary disease, unspecified: Secondary | ICD-10-CM | POA: Diagnosis present

## 2017-06-16 DIAGNOSIS — Z66 Do not resuscitate: Secondary | ICD-10-CM | POA: Diagnosis present

## 2017-06-16 DIAGNOSIS — Y732 Prosthetic and other implants, materials and accessory gastroenterology and urology devices associated with adverse incidents: Secondary | ICD-10-CM | POA: Diagnosis not present

## 2017-06-16 DIAGNOSIS — R413 Other amnesia: Secondary | ICD-10-CM | POA: Diagnosis present

## 2017-06-16 DIAGNOSIS — R0789 Other chest pain: Secondary | ICD-10-CM | POA: Diagnosis present

## 2017-06-16 DIAGNOSIS — F0151 Vascular dementia with behavioral disturbance: Secondary | ICD-10-CM | POA: Diagnosis not present

## 2017-06-16 DIAGNOSIS — N3289 Other specified disorders of bladder: Secondary | ICD-10-CM | POA: Diagnosis not present

## 2017-06-16 DIAGNOSIS — M5440 Lumbago with sciatica, unspecified side: Secondary | ICD-10-CM | POA: Diagnosis present

## 2017-06-16 DIAGNOSIS — Z8711 Personal history of peptic ulcer disease: Secondary | ICD-10-CM

## 2017-06-16 DIAGNOSIS — I2510 Atherosclerotic heart disease of native coronary artery without angina pectoris: Secondary | ICD-10-CM | POA: Diagnosis present

## 2017-06-16 DIAGNOSIS — Z79899 Other long term (current) drug therapy: Secondary | ICD-10-CM

## 2017-06-16 DIAGNOSIS — F419 Anxiety disorder, unspecified: Secondary | ICD-10-CM | POA: Diagnosis present

## 2017-06-16 DIAGNOSIS — R7989 Other specified abnormal findings of blood chemistry: Secondary | ICD-10-CM

## 2017-06-16 DIAGNOSIS — I252 Old myocardial infarction: Secondary | ICD-10-CM

## 2017-06-16 DIAGNOSIS — R8271 Bacteriuria: Secondary | ICD-10-CM | POA: Diagnosis present

## 2017-06-16 HISTORY — DX: Disorder of kidney and ureter, unspecified: N28.9

## 2017-06-16 LAB — BASIC METABOLIC PANEL
ANION GAP: 10 (ref 5–15)
BUN: 30 mg/dL — AB (ref 6–20)
CALCIUM: 9.2 mg/dL (ref 8.9–10.3)
CO2: 28 mmol/L (ref 22–32)
Chloride: 101 mmol/L (ref 101–111)
Creatinine, Ser: 1.09 mg/dL — ABNORMAL HIGH (ref 0.44–1.00)
GFR calc Af Amer: 52 mL/min — ABNORMAL LOW (ref >60)
GFR, EST NON AFRICAN AMERICAN: 45 mL/min — AB (ref >60)
GLUCOSE: 171 mg/dL — AB (ref 65–99)
Potassium: 3.5 mmol/L (ref 3.5–5.1)
Sodium: 139 mmol/L (ref 135–145)

## 2017-06-16 LAB — URINALYSIS, ROUTINE W REFLEX MICROSCOPIC
Bilirubin Urine: NEGATIVE
GLUCOSE, UA: NEGATIVE mg/dL
Ketones, ur: NEGATIVE mg/dL
LEUKOCYTES UA: NEGATIVE
Nitrite: NEGATIVE
PH: 7 (ref 5.0–8.0)
Protein, ur: NEGATIVE mg/dL
Specific Gravity, Urine: 1.01 (ref 1.005–1.030)

## 2017-06-16 LAB — URINALYSIS, MICROSCOPIC (REFLEX)

## 2017-06-16 LAB — CBC
HEMATOCRIT: 38.1 % (ref 36.0–46.0)
Hemoglobin: 12.6 g/dL (ref 12.0–15.0)
MCH: 29.6 pg (ref 26.0–34.0)
MCHC: 33.1 g/dL (ref 30.0–36.0)
MCV: 89.6 fL (ref 78.0–100.0)
PLATELETS: 313 10*3/uL (ref 150–400)
RBC: 4.25 MIL/uL (ref 3.87–5.11)
RDW: 13.6 % (ref 11.5–15.5)
WBC: 5.9 10*3/uL (ref 4.0–10.5)

## 2017-06-16 LAB — GLUCOSE, CAPILLARY: Glucose-Capillary: 136 mg/dL — ABNORMAL HIGH (ref 65–99)

## 2017-06-16 LAB — HEPATIC FUNCTION PANEL
ALK PHOS: 120 U/L (ref 38–126)
ALT: 10 U/L — AB (ref 14–54)
AST: 20 U/L (ref 15–41)
Albumin: 3.4 g/dL — ABNORMAL LOW (ref 3.5–5.0)
BILIRUBIN DIRECT: 0.1 mg/dL (ref 0.1–0.5)
BILIRUBIN INDIRECT: 0.5 mg/dL (ref 0.3–0.9)
Total Bilirubin: 0.6 mg/dL (ref 0.3–1.2)
Total Protein: 7 g/dL (ref 6.5–8.1)

## 2017-06-16 LAB — TROPONIN I
Troponin I: 0.04 ng/mL (ref <0.03)
Troponin I: 0.03 ng/mL (ref <0.03)

## 2017-06-16 LAB — LIPASE, BLOOD: Lipase: 27 U/L (ref 11–51)

## 2017-06-16 MED ORDER — PNEUMOCOCCAL VAC POLYVALENT 25 MCG/0.5ML IJ INJ
0.5000 mL | INJECTION | INTRAMUSCULAR | Status: AC
  Administered 2017-06-18: 0.5 mL via INTRAMUSCULAR
  Filled 2017-06-16 (×2): qty 0.5

## 2017-06-16 MED ORDER — ALBUTEROL SULFATE (2.5 MG/3ML) 0.083% IN NEBU: 2.5000 mg | INHALATION_SOLUTION | RESPIRATORY_TRACT | Status: DC | PRN

## 2017-06-16 MED ORDER — AMLODIPINE BESYLATE 10 MG PO TABS
10.0000 mg | ORAL_TABLET | Freq: Every day | ORAL | Status: DC
  Administered 2017-06-17 – 2017-06-20 (×4): 10 mg via ORAL
  Filled 2017-06-16 (×5): qty 1

## 2017-06-16 MED ORDER — LEVOTHYROXINE SODIUM 25 MCG PO TABS
125.0000 ug | ORAL_TABLET | Freq: Every day | ORAL | Status: DC
  Administered 2017-06-17 – 2017-06-20 (×4): 125 ug via ORAL
  Filled 2017-06-16 (×4): qty 1

## 2017-06-16 MED ORDER — ONDANSETRON 4 MG PO TBDP
ORAL_TABLET | ORAL | Status: AC
  Filled 2017-06-16: qty 1

## 2017-06-16 MED ORDER — ASPIRIN EC 81 MG PO TBEC
81.0000 mg | DELAYED_RELEASE_TABLET | Freq: Every day | ORAL | Status: DC
Start: 2017-06-17 — End: 2017-06-20
  Administered 2017-06-17 – 2017-06-20 (×4): 81 mg via ORAL
  Filled 2017-06-16 (×4): qty 1

## 2017-06-16 MED ORDER — ASPIRIN 81 MG PO CHEW
324.0000 mg | CHEWABLE_TABLET | Freq: Once | ORAL | Status: AC
  Administered 2017-06-16: 324 mg via ORAL
  Filled 2017-06-16: qty 4

## 2017-06-16 MED ORDER — LORAZEPAM 2 MG/ML IJ SOLN: 0.2500 mg | INTRAMUSCULAR | Status: AC

## 2017-06-16 MED ORDER — MORPHINE SULFATE (PF) 2 MG/ML IV SOLN
2.0000 mg | Freq: Once | INTRAVENOUS | Status: AC
  Administered 2017-06-16: 2 mg via INTRAVENOUS
  Filled 2017-06-16: qty 1

## 2017-06-16 MED ORDER — ENOXAPARIN SODIUM 40 MG/0.4ML ~~LOC~~ SOLN
40.0000 mg | SUBCUTANEOUS | Status: DC
  Administered 2017-06-17: 40 mg via SUBCUTANEOUS
  Filled 2017-06-16: qty 0.4

## 2017-06-16 MED ORDER — ACETAMINOPHEN 650 MG RE SUPP: 650.0000 mg | Freq: Four times a day (QID) | RECTAL | Status: DC | PRN

## 2017-06-16 MED ORDER — METOCLOPRAMIDE HCL 5 MG/ML IJ SOLN
10.0000 mg | Freq: Once | INTRAMUSCULAR | Status: DC
  Filled 2017-06-16 (×3): qty 2

## 2017-06-16 MED ORDER — IOPAMIDOL (ISOVUE-370) INJECTION 76%
100.0000 mL | Freq: Once | INTRAVENOUS | Status: AC | PRN
  Administered 2017-06-16: 100 mL via INTRAVENOUS

## 2017-06-16 MED ORDER — PRAVASTATIN SODIUM 40 MG PO TABS
40.0000 mg | ORAL_TABLET | Freq: Every day | ORAL | Status: DC
  Administered 2017-06-17 – 2017-06-19 (×3): 40 mg via ORAL
  Filled 2017-06-16 (×3): qty 1

## 2017-06-16 MED ORDER — SODIUM CHLORIDE 0.9 % IV SOLN
INTRAVENOUS | Status: DC
  Administered 2017-06-16: 17:00:00 via INTRAVENOUS

## 2017-06-16 MED ORDER — ACETAMINOPHEN 325 MG PO TABS: 650.0000 mg | ORAL_TABLET | Freq: Four times a day (QID) | ORAL | Status: DC | PRN

## 2017-06-16 MED ORDER — ONDANSETRON HCL 4 MG/2ML IJ SOLN
INTRAMUSCULAR | Status: AC
  Administered 2017-06-16: 4 mg
  Filled 2017-06-16: qty 2

## 2017-06-16 MED ORDER — FAMOTIDINE IN NACL 20-0.9 MG/50ML-% IV SOLN
20.0000 mg | Freq: Once | INTRAVENOUS | Status: AC
  Administered 2017-06-16: 20 mg via INTRAVENOUS
  Filled 2017-06-16: qty 50

## 2017-06-16 MED ORDER — ONDANSETRON 4 MG PO TBDP
4.0000 mg | ORAL_TABLET | Freq: Once | ORAL | Status: AC
  Administered 2017-06-16: 4 mg via ORAL

## 2017-06-16 MED ORDER — SODIUM CHLORIDE 0.9 % IV BOLUS (SEPSIS)
500.0000 mL | Freq: Once | INTRAVENOUS | Status: AC
  Administered 2017-06-16: 500 mL via INTRAVENOUS

## 2017-06-16 NOTE — ED Notes (Signed)
Troponin 0.04, results given to ED MD 

## 2017-06-16 NOTE — ED Triage Notes (Signed)
Per family patient from nursing facility.  States she is typically ambulatory without assistive devices but since yesterday has been unable to walk.  Facility denies falls.  Patient had xray performed at facility of hip and leg.  Negative for fractures.

## 2017-06-16 NOTE — Plan of Care (Signed)
D[facility] with Dr[facility]app [facility]. [name] is a 83 y/o F with pmh of dementia, carotid stenosis, CAD, AS, and hypothyroidism; who initially presented with complaints of back pain, weakness, and dizziness.  Initial vital signs show blood pressures in left arm as low as 78/40.  Blood pressures on right arm stable and HR 40-50s with second-degree heart block.  question possibility of atypical chest pain.  CTA of chest abdomen pelvis negative for any signs of dissection positive for left subclavian artery stenosis that appears to be chronic and likely cause for low blood pressure on left arm.  Labs relatively unremarkable except for troponin of 0.04, BUN 30, creatinine 1.09.  Transfer to a telemetry bed for observation overnight and monitoring her cardiac enzymes.

## 2017-06-16 NOTE — ED Notes (Addendum)
Pt states she does not feel well, reports nausea. Dr. Lynelle DoctorKnapp called to bedside. O2 applied at 2lpm.

## 2017-06-16 NOTE — ED Notes (Signed)
Assumed care of patient from Maralyn SagoSarah, RN. Pt resting quietly. No distress. NO complaints. EDP in room. Awaiting test results. Call bell within reach. Family at bedside. Cardiac monitoring in place.

## 2017-06-16 NOTE — ED Notes (Signed)
ED Provider at bedside. 

## 2017-06-16 NOTE — ED Provider Notes (Addendum)
[facility] EMERGENCY DEPARTMENT Provider Note   CSN: [id number] Arrival date & time: 06/16/17  1419     History   Chief Complaint Chief Complaint  Patient presents with  . Leg Pain    HPI Wanda Robinson is a 83 y.o. female.  HPI Patient presents to the emergency room for evaluation of difficulty walking.  Patient is a resident of a nursing facility.  Usually she is able to walk without any assistance.  Since yesterday, patient has not been able to walk.  No known falls or injuries however the patient has dementia.  Patient herself denies having any complaints although when she tried to sit up she did have some discomfort in the back of her upper legs.  She denies any trouble with chest pain or shortness of breath.  No numbness or weakness.  No vomiting or diarrhea. Past Medical History:  Diagnosis Date  . Anemia    past hx. 2011  . Aortic stenosis   . Back pain   . CAD (coronary artery disease)   . Carotid artery occlusion   . Chronic kidney disease 05-05-13   renal dysfunction- has improved from 3'11  . Colon polyps    removed with colonoscopy  . COPD (chronic obstructive pulmonary disease) (HCC)   . Dementia 05-05-13   short term memory issues"no dx"  . Gastric outlet obstruction    10'14- hospital stay of several days([facility])  . Hiatal hernia   . Hyperlipidemia   . Hypertension   . Hypothyroidism   . MI (myocardial infarction) (HCC)   . Renal artery stenosis (HCC)    Previous stenting of renal arteries bilaterally  . Renal insufficiency   . Ulcer     Patient Active Problem List   Diagnosis Date Noted  . Altered mental status 04/11/2017  . Acute blood loss anemia 06/05/2013  . Dementia 06/02/2013  . Cellulitis 05/16/2013  . Fever 05/16/2013  . Bradycardia 05/12/2013  . Murmur, cardiac 03/31/2013  . Gastric outlet obstruction 03/28/2013  . Hypokalemia 03/28/2013  . OSTEOPOROSIS NEC 01/15/2007  . HYPOTHYROIDISM 12/12/2006  . HYPERLIPIDEMIA  12/12/2006  . Essential hypertension 12/12/2006  . Coronary atherosclerosis 12/12/2006  . ASTHMA 12/12/2006  . COPD 12/12/2006  . GERD 12/12/2006    Past Surgical History:  Procedure Laterality Date  . CAROTID ENDARTERECTOMY Left 2011   Dr. Darrick PennaFields  . CHOLECYSTECTOMY     open  . CORONARY ARTERY BYPASS GRAFT  2002   Dr Dorris FetchHendrickson  . GASTROSTOMY N/A 05/12/2013   Procedure: G TUBE PLACEMENT;  Surgeon: Axel FillerArmando Ramirez, MD;  Location: [facility];  Service: General;  Laterality: N/A;  . HIATAL HERNIA REPAIR N/A 05/12/2013   Procedure: LAPAROSCOPIC REPAIR OF HIATAL HERNIA, ;  Surgeon: Axel FillerArmando Ramirez, MD;  Location: [facility];  Service: General;  Laterality: N/A;  . INSERTION OF MESH N/A 05/12/2013   Procedure: INSERTION OF MESH;  Surgeon: Axel FillerArmando Ramirez, MD;  Location: [facility];  Service: General;  Laterality: N/A;    OB History    No data available       Home Medications    Prior to Admission medications   Medication Sig Start Date End Date Taking? Authorizing Provider  acetaminophen (TYLENOL) 500 MG tablet Take 500 mg by mouth every 6 (six) hours as needed for moderate pain.    [provider]  amLODipine (NORVASC) 10 MG tablet Take 10 mg by mouth daily with breakfast.     [provider]  aspirin EC 81 MG  tablet Take 81 mg by mouth daily.    [provider]  hydrochlorothiazide (HYDRODIURIL) 25 MG tablet Take 25 mg by mouth daily with breakfast.     [provider]  levothyroxine (SYNTHROID, LEVOTHROID) 125 MCG tablet Take 125 mcg by mouth daily before breakfast.    [provider]  ondansetron (ZOFRAN) 4 MG tablet Take 1 tablet (4 mg total) by mouth every 6 (six) hours. Patient not taking: Reported on 02/03/2015 07/04/14   Wanda HaggisMabe, Martha L, MD  polyethylene glycol (MIRALAX / GLYCOLAX) packet Take 17 g by mouth daily. 05/15/17   Arby BarrettePfeiffer, Marcy, MD  pravastatin (PRAVACHOL) 40 MG tablet Take 40 mg by mouth daily.    [provider]    traMADol (ULTRAM) 50 MG tablet Take 50 mg by mouth every 6 (six) hours as needed for moderate pain or severe pain.  02/27/17 02/27/18  [provider]    Family History Family History  Problem Relation Age of Onset  . Heart disease Father        CAD  . Heart attack Father   . Hypertension Father   . Cancer Mother   . Cancer Sister   . Heart disease Brother        before age 81  . Heart attack Brother   . Hypertension Brother   . Cancer Son   . Hypertension Son     Social History Social History   Tobacco Use  . Smoking status: Former Smoker    Last attempt to quit: 05/05/1989    Years since quitting: 28.1  . Smokeless tobacco: Never Used  Substance Use Topics  . Alcohol use: No    Alcohol/week: 0.0 oz  . Drug use: No     Allergies   Codeine phosphate   Review of Systems Review of Systems  All other systems reviewed and are negative.    Physical Exam Updated Vital Signs BP 95/65   Pulse (!) 59   Temp 98.2 F (36.8 C) (Oral)   Resp 13   Ht 1.549 m (5\' 1" )   Wt 57.2 kg (126 lb)   SpO2 99%   BMI 23.81 kg/m   Physical Exam  Constitutional: No distress.  HENT:  Head: Normocephalic and atraumatic.  Right Ear: External ear normal.  Left Ear: External ear normal.  Eyes: Conjunctivae are normal. Right eye exhibits no discharge. Left eye exhibits no discharge. No scleral icterus.  Neck: Neck supple. No tracheal deviation present.  Cardiovascular: Intact distal pulses. An irregular rhythm present. Bradycardia present.  Murmur heard.  Systolic murmur is present with a grade of 5/6. Pulses:      Radial pulses are 2+ on the right side, and 0 on the left side.       Femoral pulses are 1+ on the right side, and 1+ on the left side.      Dorsalis pedis pulses are 1+ on the right side, and 1+ on the left side.  assymmetric pulses, difficult to palpate bilateral lower extrem and left radial, harsh systolic murmur  Pulmonary/Chest: Effort normal and breath  sounds normal. No stridor. No respiratory distress. She has no wheezes. She has no rales.  Abdominal: Soft. Bowel sounds are normal. She exhibits no distension. There is no tenderness. There is no rebound and no guarding.  Musculoskeletal: She exhibits no edema or tenderness.  Neurological: She is alert. She has normal strength. No cranial nerve deficit (no facial droop, extraocular movements intact, no slurred speech) or sensory deficit.  She exhibits normal muscle tone. She displays no seizure activity. Coordination normal.  Skin: Skin is warm and dry. No rash noted. She is not diaphoretic.  Psychiatric: She has a normal mood and affect.  Nursing note and vitals reviewed.    ED Treatments / Results  Labs (all labs ordered are listed, but only abnormal results are displayed) Labs Reviewed  BASIC METABOLIC PANEL - Abnormal; Notable for the following components:      Result Value   Glucose, Bld 171 (*)    BUN 30 (*)    Creatinine, Ser 1.09 (*)    GFR calc non Af Amer 45 (*)    GFR calc Af Amer 52 (*)    All other components within normal limits  TROPONIN I - Abnormal; Notable for the following components:   Troponin I 0.04 (*)    All other components within normal limits  CBC  TROPONIN I  URINALYSIS, ROUTINE W REFLEX MICROSCOPIC  HEPATIC FUNCTION PANEL  LIPASE, BLOOD    EKG  EKG Interpretation  Date/Time:  Sunday June 16 2017 14:58:03 EST Ventricular Rate:  55 PR Interval:    QRS Duration: 90 QT Interval:  478 QTC Calculation: 457 R Axis:   87 Text Interpretation:  Second degree heart block Nonspecific ST and T wave abnormality Abnormal ECG No significant change since last tracing Confirmed by Linwood Dibbles [id number]) on 06/16/2017 3:02:57 PM       Radiology Dg Chest 2 View  Result Date: 06/16/2017 CLINICAL DATA:  Dementia.  Posterior hip pain. EXAM: CHEST  2 VIEW COMPARISON:  May 15, 2017 FINDINGS: The heart size and mediastinal contours are stable. The heart size  is enlarged. Both lungs are clear. The visualized skeletal structures are stable. IMPRESSION: No active cardiopulmonary disease. Electronically Signed   By: Sherian Rein M.D.   On: 06/16/2017 16:37   Ct Angio Chest/abd/pel For Dissection W And/or Wo Contrast  Result Date: 06/16/2017 CLINICAL DATA:  Chest and back pain.  Weak left radial pulse. EXAM: CT ANGIOGRAPHY CHEST, ABDOMEN AND PELVIS TECHNIQUE: Multidetector CT imaging through the chest, abdomen and pelvis was performed using the standard protocol during bolus administration of intravenous contrast. Multiplanar reconstructed images and MIPs were obtained and reviewed to evaluate the vascular anatomy. CONTRAST:  ISOVUE-370 IOPAMIDOL (ISOVUE-370) INJECTION 76% COMPARISON:  Chest radiograph from earlier today. 05/16/2013 chest CT. 04/11/2017 CT abdomen/pelvis. FINDINGS: CTA CHEST FINDINGS Cardiovascular: Normal heart size. No significant pericardial fluid/thickening. Left main and 3 vessel coronary atherosclerosis status post CABG. Atherosclerotic nonaneurysmal thoracic aorta. No acute intramural hematoma,, dissection, pseudoaneurysm or penetrating atherosclerotic ulcer in the thoracic aorta. High-grade atherosclerotic stenosis versus occlusion of the proximal left subclavian artery. Normal caliber pulmonary arteries. No central pulmonary emboli. Mediastinum/Nodes: No discrete thyroid nodules. Unremarkable esophagus. No pathologically enlarged axillary, mediastinal or hilar lymph nodes. Lungs/Pleura: No pneumothorax. No pleural effusion. No acute consolidative airspace disease or lung masses. Mild patchy tree-in-bud opacities are noted in the basilar right upper lobe, right middle lobe, lingula and anterior right lower lobe with associated mild cylindrical and varicoid bronchiectasis. Musculoskeletal: No aggressive appearing focal osseous lesions. Intact sternotomy wires. Moderate thoracic spondylosis. Review of the MIP images confirms the above  findings. CTA ABDOMEN AND PELVIS FINDINGS VASCULAR Aorta: Prominent abdominal aortic atherosclerosis. Infrarenal 3.3 cm abdominal aortic aneurysm, stable. Penetrating atherosclerotic ulcer in the right lower infrarenal abdominal aorta measuring 1.6 x 0.8 cm, stable. No acute dissection or intramural hematoma in the abdominal aorta. Celiac: At least moderate ostial stenosis.  No dissection. SMA: Moderate ostial stenosis.  No dissection. Renals: Proximal left renal artery stent is in place. Patent single left renal artery. Two right renal arteries, both with proximal renal artery stents in place, the more superior of which appears occluded proximally and the more inferior of which appears patent. No dissection. IMA: Probable high-grade ostial stenosis.  No dissection. Inflow: High-grade stenosis with occlusion in the distal right common femoral artery, unchanged. No high-grade stenosis on the left. No dissection. No aneurysm. Veins: No obvious venous abnormality within the limitations of this arterial phase study. Review of the MIP images confirms the above findings. NON-VASCULAR Hepatobiliary: Normal liver size. Simple left liver lobe cysts, largest 1.2 cm. Granulomatous left liver lobe calcification is stable. No new liver lesions. Cholecystectomy. Bile ducts are stable and within normal post cholecystectomy limits. Pancreas: Normal, with no mass or duct dilation. Spleen: Normal size. No mass. Adrenals/Urinary Tract: Normal adrenals. Stable asymmetric moderate right renal atrophy and scarring. No hydronephrosis. Partially exophytic simple 1.7 cm lower right renal cyst. A few tiny nonobstructing 2-3 mm stones in both kidneys. Simple 1.0 cm lower left renal cyst. Prominently distended bladder. Suggestion of mild diffuse bladder wall thickening. Moderate 3.0 cm posterior right and 3.9 cm anterior left bladder diverticulum. No focal bladder mass. Stomach/Bowel: Normal non-distended stomach. Normal caliber small bowel  with no small bowel wall thickening. Appendix not discretely visualized. No pericecal inflammatory changes. Marked colonic diverticulosis, most prominent in the sigmoid colon, with no large bowel wall thickening or pericolonic fat stranding. Moderate rectal fecal distention without definite rectal wall thickening. Vascular/Lymphatic: See above for vascular findings. No pathologically enlarged lymph nodes in the abdomen or pelvis. Reproductive: Grossly normal uterus.  No adnexal mass. Other: No pneumoperitoneum, ascites or focal fluid collection. Musculoskeletal: No aggressive appearing focal osseous lesions. Asymmetric sclerosis throughout the right sacral ala compatible with chronic nondisplaced insufficiency fracture. Marked lumbar spondylosis. Review of the MIP images confirms the above findings. IMPRESSION: 1. No acute aortic syndrome.  Aortic Atherosclerosis (ICD10-I70.0). 2. High-grade atherosclerotic stenosis versus occlusion of the proximal left subclavian artery, chronic appearing. Numerous additional chronic appearing arterial findings as detailed above. 3. Stable 3.3 cm infrarenal abdominal aortic aneurysm. Recommend followup by ultrasound in 3 years. This recommendation follows ACR consensus guidelines: White Paper of the ACR Incidental Findings Committee II on Vascular Findings. J Am Coll Radiol 2013; 10:789-794. 4. 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 5. Nonobstructing bilateral nephrolithiasis. No hydronephrosis. Prominently distended bladder with moderate bladder diverticula. Recommend clinical correlation to exclude bladder outlet obstruction. 6. Marked colonic diverticulosis. 7. Chronic right sacral insufficiency fracture. Electronically Signed   By: Delbert Phenix M.D.   On: 06/16/2017 17:15   Dg Hips Bilat W Or Wo Pelvis 2 Views  Result Date: 06/16/2017 CLINICAL DATA:   Bilateral hip pain for several weeks EXAM: DG HIP (WITH OR WITHOUT PELVIS) 2V BILAT COMPARISON:  June 02, 2015 FINDINGS: There is no evidence of hip fracture or dislocation. Arthritic changes of bilateral hips are noted. IMPRESSION: No acute fracture or dislocation.Arthritic changes of bilateral hips are noted. Electronically Signed   By: Sherian Rein M.D.   On: 06/16/2017 16:39    Procedures .Critical Care Performed by: Linwood Dibbles, MD Authorized by: Linwood Dibbles, MD   Critical care provider statement:    Critical care time (minutes):  45   Critical care was time spent personally by me on the  following activities:  Discussions with consultants, evaluation of patient's response to treatment, examination of patient, ordering and performing treatments and interventions, ordering and review of laboratory studies, ordering and review of radiographic studies, pulse oximetry, re-evaluation of patient's condition, obtaining history from patient or surrogate and review of old charts   (including critical care time)  Medications Ordered in ED Medications  0.9 %  sodium chloride infusion ( Intravenous New Bag/Given 06/16/17 1648)  sodium chloride 0.9 % bolus 500 mL (500 mLs Intravenous New Bag/Given 06/16/17 1834)  aspirin chewable tablet 324 mg (324 mg Oral Given 06/16/17 1645)  iopamidol (ISOVUE-370) 76 % injection 100 mL (100 mLs Intravenous Contrast Given 06/16/17 1625)  morphine 2 MG/ML injection 2 mg (2 mg Intravenous Given 06/16/17 1741)  ondansetron (ZOFRAN-ODT) disintegrating tablet 4 mg (4 mg Oral Given 06/16/17 1815)  ondansetron (ZOFRAN) 4 MG/2ML injection (4 mg  Given 06/16/17 1833)     Initial Impression / Assessment and Plan / ED Course  I have reviewed the triage vital signs and the nursing notes.  Pertinent labs & imaging results that were available during my care of the patient were reviewed by me and considered in my medical decision making (see chart for details).  Clinical  Course as of Jun 16 1916  Wynelle Link Jun 16, 2017  1829 Pt states she is having more nausea now.  She feels terrible.  Thinks she has to throw up.  Pain med was an hour earlier.  Unlikely causing her symptoms.  [JK]  1834 Possible bladder outlet obstruction noted on CT.   Foley ordered  [JK]  1916 Pt is still feeling nausea.  Will try pepcid and reglan.  Will need to be careful with her prolonged qt.  Confirmed no code status with family.  [JK]  1916 Discussed with Dr Katrinka Blazing.  Will transfer for admission  [JK]    Clinical Course User Index [JK] Linwood Dibbles, MD    Patient presented to the emergency with complaints of weakness and back pain.  Pain seems to be in her lower back.  Patient is not a greatest historian and she had several other generalized complaints.  She had abnormal pulses and a systolic murmur on exam.  CT Angio of the chest abdomen pelvis was performed to rule out any aortic dissection.  Did show findings suggesting chronic subclavian stenosis on the left side.  This would account for her abnormal pulses.  No acute findings noted on the CT scan other than some bladder outlet obstruction.  Patient was in the emergency room she began feeling more nauseated.  She was also noted to be bradycardic.  She did have a slight elevation of troponin.  Considering her persistent symptoms and her weakness.  I will try to arrange for her to be admitted to the hospital.  I have ordered a dose of Zofran for her nausea.  I will add on LFTs and lipase considering her abdominal complaints now no acute abnormalities were noted.  We will also continue with a repeat troponin and EKG.  Final Clinical Impressions(s) / ED Diagnoses   Final diagnoses:  Elevated troponin  Nausea  Midline low back pain, unspecified chronicity, with sciatica presence unspecified      Linwood Dibbles, MD 06/16/17 Lyndal Pulley, MD 06/16/17 [phone]

## 2017-06-16 NOTE — H&P (Addendum)
History and Physical    Wanda HeathMary M Robinson ZOX:[id number] DOB: [birth year] DOA: 06/16/2017  Referring MD/NP/PA: Dr. Lynelle DoctorKnapp PCP: Bailey MechPodraza, Cole Christopher, PA-C  [facility]  Chief Complaint: Left leg pain  I have personally briefly reviewed patient's old medical records in [facility]   HPI: Wanda HeathMary M Robinson is a 83 y.o. female with medical history significant of dementia, carotid stenosis, CAD, AS, and hypothyroidism; who initially presented with complaints of left leg pain and inability to walk.  History is obtained from the patient's family members who are present at bedside as patient has dementia and is unable to give her own.  [facility] and normally able to ambulate without need of assistance.  However since yesterday patient has been unable to bear weight on the left leg.  X-rays performed showed no acute signs of a fracture.  Family notes that the patient chronically has issues with back pain.  Family notes the last known fall to occurred 1 month ago for which she was evaluated and noted to have no acute fractures at that time.  Other associated symptoms included complaints of nausea and intermittent episodes of sharp abdominal pain lasting 20-30 minutes.  Denies any specific complaints of chest pain, vomiting, leg swelling, shortness of breath, or diarrhea.    ED Course: On admission to the emergency department patient was noted to be afebrile, pulse documented as 28-117, respirations 1123, blood pressure 78/40-172/64, and O2 saturation maintained on room air.  Blood pressures documented on the left arm noted to be significantly low, but normal in right arm.  Labs revealed BUN 30, creatinine 1.09, and troponin 0.04.  Patient had complained of some back pain as well for which a CT angiogram of the chest, abdomen, and pelvis was obtained but showed no acute signs of a dissection.  Did show urinary retention for which a Foley  catheter was placed and drained approximately 1300 mL of urine per family.  Due to elevated troponin TRH recommended to admit for observation questioning atypical chest pain.  Review of Systems  Unable to perform ROS: Dementia  Constitutional: Negative for fever.  Cardiovascular: Negative for chest pain.  Gastrointestinal: Positive for abdominal pain and nausea. Negative for vomiting.  Genitourinary: Negative for dysuria and urgency.  Musculoskeletal: Positive for back pain and joint pain.  Neurological: Positive for weakness.  Psychiatric/Behavioral: Positive for memory loss.    Past Medical History:  Diagnosis Date  . Anemia    past hx. 2011  . Aortic stenosis   . Back pain   . CAD (coronary artery disease)   . Carotid artery occlusion   . Chronic kidney disease 05-05-13   renal dysfunction- has improved from 3'11  . Colon polyps    removed with colonoscopy  . COPD (chronic obstructive pulmonary disease) (HCC)   . Dementia 05-05-13   short term memory issues"no dx"  . Gastric outlet obstruction    10'14- hospital stay of several days([facility])  . Hiatal hernia   . Hyperlipidemia   . Hypertension   . Hypothyroidism   . MI (myocardial infarction) (HCC)   . Renal artery stenosis (HCC)    Previous stenting of renal arteries bilaterally  . Renal insufficiency   . Ulcer     Past Surgical History:  Procedure Laterality Date  . CAROTID ENDARTERECTOMY Left 2011   Dr. Darrick PennaFields  . CHOLECYSTECTOMY     open  . CORONARY ARTERY BYPASS GRAFT  2002  Dr Dorris FetchHendrickson  . GASTROSTOMY N/A 05/12/2013   Procedure: G TUBE PLACEMENT;  Surgeon: Axel FillerArmando Ramirez, MD;  Location: [facility];  Service: General;  Laterality: N/A;  . HIATAL HERNIA REPAIR N/A 05/12/2013   Procedure: LAPAROSCOPIC REPAIR OF HIATAL HERNIA, ;  Surgeon: Axel FillerArmando Ramirez, MD;  Location: [facility];  Service: General;  Laterality: N/A;  . INSERTION OF MESH N/A 05/12/2013   Procedure: INSERTION OF MESH;  Surgeon: Axel FillerArmando Ramirez, MD;   Location: [facility];  Service: General;  Laterality: N/A;     reports that she quit smoking about 28 years ago. she has never used smokeless tobacco. She reports that she does not drink alcohol or use drugs.  Allergies  Allergen Reactions  . Codeine Phosphate Nausea Only    Family History  Problem Relation Age of Onset  . Heart disease Father        CAD  . Heart attack Father   . Hypertension Father   . Cancer Mother   . Cancer Sister   . Heart disease Brother        before age 81  . Heart attack Brother   . Hypertension Brother   . Cancer Son   . Hypertension Son     Prior to Admission medications   Medication Sig Start Date End Date Taking? Authorizing Provider  acetaminophen (TYLENOL) 500 MG tablet Take 500 mg by mouth every 6 (six) hours as needed for moderate pain.    [provider]  amLODipine (NORVASC) 10 MG tablet Take 10 mg by mouth daily with breakfast.     [provider]  aspirin EC 81 MG tablet Take 81 mg by mouth daily.    [provider]  hydrochlorothiazide (HYDRODIURIL) 25 MG tablet Take 25 mg by mouth daily with breakfast.     [provider]  levothyroxine (SYNTHROID, LEVOTHROID) 125 MCG tablet Take 125 mcg by mouth daily before breakfast.    [provider]  ondansetron (ZOFRAN) 4 MG tablet Take 1 tablet (4 mg total) by mouth every 6 (six) hours. Patient not taking: Reported on 02/03/2015 07/04/14   Phillis HaggisMabe, Martha L, MD  polyethylene glycol (MIRALAX / GLYCOLAX) packet Take 17 g by mouth daily. 05/15/17   Arby BarrettePfeiffer, Marcy, MD  pravastatin (PRAVACHOL) 40 MG tablet Take 40 mg by mouth daily.    [provider]  traMADol (ULTRAM) 50 MG tablet Take 50 mg by mouth every 6 (six) hours as needed for moderate pain or severe pain.  02/27/17 02/27/18  [provider]    Physical Exam:  Constitutional: Elderly female in no acute distress at this time Vitals:   06/16/17 1945 06/16/17 2000 06/16/17 2015 06/16/17  2205  BP:  (!) 171/56  128/74  Pulse: (!) 46 (!) 43 (!) 58 75  Resp: (!) 22 17 14 18   Temp:    97.7 F (36.5 C)  TempSrc:    Oral  SpO2: 100% 100% 100% 94%  Weight:    55.6 kg (122 lb 9.6 oz)  Height:    5\' 2"  (1.575 m)   Eyes: PERRL, lids and conjunctivae normal ENMT: Mucous membranes are moist. Posterior pharynx clear of any exudate or lesions. Neck: normal, supple, no masses, no thyromegaly Respiratory: clear to auscultation bilaterally, no wheezing, no crackles. Normal respiratory effort. No accessory muscle use.  Cardiovascular: Bradycardic with positive systolic ejection murmur 2 out of 6, no rubs / gallops. No extremity edema. 2+ pedal pulses. No carotid bruits.   Abdomen: no tenderness, no  masses palpated. No hepatosplenomegaly. Bowel sounds positive.  Musculoskeletal: no clubbing / cyanosis. No joint deformity upper and lower extremities. Good ROM of both lower extremities noted with no significant complaints of pain with manipulation of left leg. Skin: no rashes, lesions, ulcers. No induration Neurologic: CN 2-12 grossly intact. Sensation intact, DTR normal. Strength 5/5 in all 4.  Psychiatric: Dementia oriented only to person at this time.    Labs on Admission: I have personally reviewed following labs and imaging studies  CBC: Recent Labs  Lab 06/16/17 1520  WBC 5.9  HGB 12.6  HCT 38.1  MCV 89.6  PLT 313   Basic Metabolic Panel: Recent Labs  Lab 06/16/17 1520  NA 139  K 3.5  CL 101  CO2 28  GLUCOSE 171*  BUN 30*  CREATININE 1.09*  CALCIUM 9.2   GFR: Estimated Creatinine Clearance: 29.3 mL/min (A) (by C-G formula based on SCr of 1.09 mg/dL (H)). Liver Function Tests: Recent Labs  Lab 06/16/17 1520  AST 20  ALT 10*  ALKPHOS 120  BILITOT 0.6  PROT 7.0  ALBUMIN 3.4*   Recent Labs  Lab 06/16/17 1520  LIPASE 27   No results for input(s): AMMONIA in the last 168 hours. Coagulation Profile: No results for input(s): INR, PROTIME in the last 168  hours. Cardiac Enzymes: Recent Labs  Lab 06/16/17 1520 06/16/17 1930  TROPONINI 0.04* 0.03*   BNP (last 3 results) No results for input(s): PROBNP in the last 8760 hours. HbA1C: No results for input(s): HGBA1C in the last 72 hours. CBG: Recent Labs  Lab 06/16/17 2233  GLUCAP 136*   Lipid Profile: No results for input(s): CHOL, HDL, LDLCALC, TRIG, CHOLHDL, LDLDIRECT in the last 72 hours. Thyroid Function Tests: No results for input(s): TSH, T4TOTAL, FREET4, T3FREE, THYROIDAB in the last 72 hours. Anemia Panel: No results for input(s): VITAMINB12, FOLATE, FERRITIN, TIBC, IRON, RETICCTPCT in the last 72 hours. Urine analysis:    Component Value Date/Time   COLORURINE YELLOW 06/16/2017 1533   APPEARANCEUR CLEAR 06/16/2017 1533   LABSPEC 1.010 06/16/2017 1533   PHURINE 7.0 06/16/2017 1533   GLUCOSEU NEGATIVE 06/16/2017 1533   HGBUR LARGE (A) 06/16/2017 1533   HGBUR negative 01/19/2008 1008   BILIRUBINUR NEGATIVE 06/16/2017 1533   KETONESUR NEGATIVE 06/16/2017 1533   PROTEINUR NEGATIVE 06/16/2017 1533   UROBILINOGEN 1.0 07/04/2014 1447   NITRITE NEGATIVE 06/16/2017 1533   LEUKOCYTESUR NEGATIVE 06/16/2017 1533   Sepsis Labs: No results found for this or any previous visit (from the past 240 hour(s)).   Radiological Exams on Admission: Dg Chest 2 View  Result Date: 06/16/2017 CLINICAL DATA:  Dementia.  Posterior hip pain. EXAM: CHEST  2 VIEW COMPARISON:  May 15, 2017 FINDINGS: The heart size and mediastinal contours are stable. The heart size is enlarged. Both lungs are clear. The visualized skeletal structures are stable. IMPRESSION: No active cardiopulmonary disease. Electronically Signed   By: Sherian Rein M.D.   On: 06/16/2017 16:37   Ct Angio Chest/abd/pel For Dissection W And/or Wo Contrast  Result Date: 06/16/2017 CLINICAL DATA:  Chest and back pain.  Weak left radial pulse. EXAM: CT ANGIOGRAPHY CHEST, ABDOMEN AND PELVIS TECHNIQUE: Multidetector CT imaging  through the chest, abdomen and pelvis was performed using the standard protocol during bolus administration of intravenous contrast. Multiplanar reconstructed images and MIPs were obtained and reviewed to evaluate the vascular anatomy. CONTRAST:  ISOVUE-370 IOPAMIDOL (ISOVUE-370) INJECTION 76% COMPARISON:  Chest radiograph from earlier today. 05/16/2013 chest CT. 04/11/2017 CT  abdomen/pelvis. FINDINGS: CTA CHEST FINDINGS Cardiovascular: Normal heart size. No significant pericardial fluid/thickening. Left main and 3 vessel coronary atherosclerosis status post CABG. Atherosclerotic nonaneurysmal thoracic aorta. No acute intramural hematoma,, dissection, pseudoaneurysm or penetrating atherosclerotic ulcer in the thoracic aorta. High-grade atherosclerotic stenosis versus occlusion of the proximal left subclavian artery. Normal caliber pulmonary arteries. No central pulmonary emboli. Mediastinum/Nodes: No discrete thyroid nodules. Unremarkable esophagus. No pathologically enlarged axillary, mediastinal or hilar lymph nodes. Lungs/Pleura: No pneumothorax. No pleural effusion. No acute consolidative airspace disease or lung masses. Mild patchy tree-in-bud opacities are noted in the basilar right upper lobe, right middle lobe, lingula and anterior right lower lobe with associated mild cylindrical and varicoid bronchiectasis. Musculoskeletal: No aggressive appearing focal osseous lesions. Intact sternotomy wires. Moderate thoracic spondylosis. Review of the MIP images confirms the above findings. CTA ABDOMEN AND PELVIS FINDINGS VASCULAR Aorta: Prominent abdominal aortic atherosclerosis. Infrarenal 3.3 cm abdominal aortic aneurysm, stable. Penetrating atherosclerotic ulcer in the right lower infrarenal abdominal aorta measuring 1.6 x 0.8 cm, stable. No acute dissection or intramural hematoma in the abdominal aorta. Celiac: At least moderate ostial stenosis.  No dissection. SMA: Moderate ostial stenosis.  No dissection.  Renals: Proximal left renal artery stent is in place. Patent single left renal artery. Two right renal arteries, both with proximal renal artery stents in place, the more superior of which appears occluded proximally and the more inferior of which appears patent. No dissection. IMA: Probable high-grade ostial stenosis.  No dissection. Inflow: High-grade stenosis with occlusion in the distal right common femoral artery, unchanged. No high-grade stenosis on the left. No dissection. No aneurysm. Veins: No obvious venous abnormality within the limitations of this arterial phase study. Review of the MIP images confirms the above findings. NON-VASCULAR Hepatobiliary: Normal liver size. Simple left liver lobe cysts, largest 1.2 cm. Granulomatous left liver lobe calcification is stable. No new liver lesions. Cholecystectomy. Bile ducts are stable and within normal post cholecystectomy limits. Pancreas: Normal, with no mass or duct dilation. Spleen: Normal size. No mass. Adrenals/Urinary Tract: Normal adrenals. Stable asymmetric moderate right renal atrophy and scarring. No hydronephrosis. Partially exophytic simple 1.7 cm lower right renal cyst. A few tiny nonobstructing 2-3 mm stones in both kidneys. Simple 1.0 cm lower left renal cyst. Prominently distended bladder. Suggestion of mild diffuse bladder wall thickening. Moderate 3.0 cm posterior right and 3.9 cm anterior left bladder diverticulum. No focal bladder mass. Stomach/Bowel: Normal non-distended stomach. Normal caliber small bowel with no small bowel wall thickening. Appendix not discretely visualized. No pericecal inflammatory changes. Marked colonic diverticulosis, most prominent in the sigmoid colon, with no large bowel wall thickening or pericolonic fat stranding. Moderate rectal fecal distention without definite rectal wall thickening. Vascular/Lymphatic: See above for vascular findings. No pathologically enlarged lymph nodes in the abdomen or pelvis.  Reproductive: Grossly normal uterus.  No adnexal mass. Other: No pneumoperitoneum, ascites or focal fluid collection. Musculoskeletal: No aggressive appearing focal osseous lesions. Asymmetric sclerosis throughout the right sacral ala compatible with chronic nondisplaced insufficiency fracture. Marked lumbar spondylosis. Review of the MIP images confirms the above findings. IMPRESSION: 1. No acute aortic syndrome.  Aortic Atherosclerosis (ICD10-I70.0). 2. High-grade atherosclerotic stenosis versus occlusion of the proximal left subclavian artery, chronic appearing. Numerous additional chronic appearing arterial findings as detailed above. 3. Stable 3.3 cm infrarenal abdominal aortic aneurysm. Recommend followup by ultrasound in 3 years. This recommendation follows ACR consensus guidelines: White Paper of the ACR Incidental Findings Committee II on Vascular Findings. J Am Coll Radiol 2013; 10:789-794. 4.  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 5. Nonobstructing bilateral nephrolithiasis. No hydronephrosis. Prominently distended bladder with moderate bladder diverticula. Recommend clinical correlation to exclude bladder outlet obstruction. 6. Marked colonic diverticulosis. 7. Chronic right sacral insufficiency fracture. Electronically Signed   By: Delbert Phenix M.D.   On: 06/16/2017 17:15   Dg Hips Bilat W Or Wo Pelvis 2 Views  Result Date: 06/16/2017 CLINICAL DATA:  Bilateral hip pain for several weeks EXAM: DG HIP (WITH OR WITHOUT PELVIS) 2V BILAT COMPARISON:  June 02, 2015 FINDINGS: There is no evidence of hip fracture or dislocation. Arthritic changes of bilateral hips are noted. IMPRESSION: No acute fracture or dislocation.Arthritic changes of bilateral hips are noted. Electronically Signed   By: Sherian Rein M.D.   On: 06/16/2017 16:39    EKG: Independently reviewed.   Second-degree heart block  Assessment/Plan Left leg pain: Acute.  Patient with inability to ambulate was reported complaints of pain in the left leg.  No reported recent falls.  X-rays of the left hip showed no acute signs of fracture.  CT scan did reveal signs of a chronic sacral fracture on the right side which does not appear would correlate with patient's symptoms.  If symptoms persist would consider need of MRI of the lumbar spine given issues with urinary retention question possibility of fall unwitnessed with trauma to lumbar spine - Admit to telemetry bed - Physical therapy to eval and treat  Urinary retention: Acute.  Patient noted to have urinary retention for which a Foley catheter was placed and reportedly trying 1300 mL of urine. - Continue Foley catheter - Strict I&O's - Bladder training    Suspected dehydration: Patient presents with elevated BUN creatinine ratio to suggest dehydration.  Possible cause of weakness. - IV fluids overnight as tolerated of normal saline  Elevated troponin: Chronic.  Initial troponin elevated at 0.04 on admission, but appears to have been previously elevated during previous admission.  Patient with no specific chest pain complaints. - Trend cardiac enzymes - May want to consult cardiology in a.m.  Abnormal EKG: Patient appears to be in type II heart block with prolonged QTC of 512.  Heart rates appear to be in the 40-117 on looking at patient's flowsheet, but recorded as low as 28. - Follow-up telemetry overnight - Recheck EKG in a.m.  Dementia: Family reports patient has been taking Ativan at night to help with anxiety related with dementia. - Continue Ativan at night prn anxiety  Essential hypertension - Continue amlodipine   Hypothyroidism - Check TSH - Continue levothyroxine  Stenosis of the left subclavian artery: Chronic.  Likely cause of patient's low blood pressures on left arm.  Infrarenal AAA: Chronic.  Seen to be 3.3 cm on CT  angiogram. - Continue recommended outpatient imaging  Hyperlipidemia - Continue pravastatin  DVT prophylaxis: lovenox  Code Status: DNR Family Communication: This plan of care with patient and family present at bedside Disposition Plan: Discharge back to skilled nursing facility once medically stable Consults called: none  Admission status: observation  Clydie Braun MD [facility] Pager [phone]   If 7PM-7AM, please contact night-coverage [email or web address] Password [email or web address]  06/16/2017, 11:40 PM

## 2017-06-17 DIAGNOSIS — R338 Other retention of urine: Secondary | ICD-10-CM | POA: Diagnosis not present

## 2017-06-17 DIAGNOSIS — M79605 Pain in left leg: Secondary | ICD-10-CM | POA: Diagnosis not present

## 2017-06-17 DIAGNOSIS — R778 Other specified abnormalities of plasma proteins: Secondary | ICD-10-CM | POA: Diagnosis present

## 2017-06-17 DIAGNOSIS — F0151 Vascular dementia with behavioral disturbance: Secondary | ICD-10-CM | POA: Diagnosis not present

## 2017-06-17 DIAGNOSIS — E039 Hypothyroidism, unspecified: Secondary | ICD-10-CM

## 2017-06-17 DIAGNOSIS — I10 Essential (primary) hypertension: Secondary | ICD-10-CM | POA: Diagnosis not present

## 2017-06-17 DIAGNOSIS — R748 Abnormal levels of other serum enzymes: Secondary | ICD-10-CM | POA: Diagnosis not present

## 2017-06-17 DIAGNOSIS — R7989 Other specified abnormal findings of blood chemistry: Secondary | ICD-10-CM

## 2017-06-17 LAB — CBC
HEMATOCRIT: 35.6 % — AB (ref 36.0–46.0)
Hemoglobin: 11.7 g/dL — ABNORMAL LOW (ref 12.0–15.0)
MCH: 29.4 pg (ref 26.0–34.0)
MCHC: 32.9 g/dL (ref 30.0–36.0)
MCV: 89.4 fL (ref 78.0–100.0)
PLATELETS: 277 10*3/uL (ref 150–400)
RBC: 3.98 MIL/uL (ref 3.87–5.11)
RDW: 13.8 % (ref 11.5–15.5)
WBC: 6.3 10*3/uL (ref 4.0–10.5)

## 2017-06-17 LAB — BASIC METABOLIC PANEL
ANION GAP: 12 (ref 5–15)
BUN: 17 mg/dL (ref 6–20)
CALCIUM: 8.7 mg/dL — AB (ref 8.9–10.3)
CO2: 21 mmol/L — AB (ref 22–32)
CREATININE: 0.83 mg/dL (ref 0.44–1.00)
Chloride: 105 mmol/L (ref 101–111)
GFR calc Af Amer: >60 mL/min (ref >60)
GLUCOSE: 84 mg/dL (ref 65–99)
Potassium: 3.3 mmol/L — ABNORMAL LOW (ref 3.5–5.1)
Sodium: 138 mmol/L (ref 135–145)

## 2017-06-17 LAB — TROPONIN I
Troponin I: 0.03 ng/mL (ref <0.03)
Troponin I: 0.03 ng/mL (ref <0.03)

## 2017-06-17 LAB — MRSA PCR SCREENING: MRSA BY PCR: NEGATIVE

## 2017-06-17 LAB — TSH: TSH: 0.41 u[IU]/mL (ref 0.350–4.500)

## 2017-06-17 MED ORDER — LIDOCAINE 5 % EX PTCH
1.0000 | MEDICATED_PATCH | CUTANEOUS | Status: DC
  Administered 2017-06-17 – 2017-06-20 (×2): 1 via TRANSDERMAL
  Filled 2017-06-17 (×4): qty 1

## 2017-06-17 MED ORDER — SENNOSIDES-DOCUSATE SODIUM 8.6-50 MG PO TABS
1.0000 | ORAL_TABLET | Freq: Every day | ORAL | Status: DC
  Administered 2017-06-17 – 2017-06-18 (×2): 1 via ORAL
  Filled 2017-06-17 (×2): qty 1

## 2017-06-17 MED ORDER — TRAMADOL HCL 50 MG PO TABS
50.0000 mg | ORAL_TABLET | Freq: Four times a day (QID) | ORAL | Status: DC | PRN
  Administered 2017-06-17: 50 mg via ORAL
  Filled 2017-06-17: qty 1

## 2017-06-17 MED ORDER — BISACODYL 10 MG RE SUPP
10.0000 mg | Freq: Once | RECTAL | Status: AC
  Administered 2017-06-17: 10 mg via RECTAL
  Filled 2017-06-17: qty 1

## 2017-06-17 MED ORDER — LORAZEPAM 0.5 MG PO TABS
0.5000 mg | ORAL_TABLET | Freq: Every evening | ORAL | Status: DC | PRN
  Administered 2017-06-17: 0.5 mg via ORAL
  Filled 2017-06-17: qty 1

## 2017-06-17 MED ORDER — POTASSIUM CHLORIDE CRYS ER 20 MEQ PO TBCR
40.0000 meq | EXTENDED_RELEASE_TABLET | Freq: Once | ORAL | Status: AC
  Administered 2017-06-17: 40 meq via ORAL
  Filled 2017-06-17: qty 2

## 2017-06-17 NOTE — Progress Notes (Signed)
PROGRESS NOTE    Wanda HeathMary M Robinson  WUJ:[id number] DOB: 1928/12/22 DOA: 06/16/2017 PCP: Bailey MechPodraza, Cole Christopher, PA-C   Outpatient Specialists:     Brief Narrative:  Wanda HeathMary M Robinson is a 83 y.o. female with medical history significant of dementia, carotid stenosis, CAD, AS,andhypothyroidism;who initially presented with complaints of left leg pain and inability to walk.  History is obtained from the patient's family members who are present at bedside as patient has dementia and is unable to give her own.  [facility] and normally able to ambulate without need of assistance.  However since yesterday patient has been unable to bear weight on the left leg.  X-rays performed showed no acute signs of a fracture.  Family notes that the patient chronically has issues with back pain.  Family notes the last known fall to occurred 1 month ago for which she was evaluated and noted to have no acute fractures at that time.  Other associated symptoms included complaints of nausea and intermittent episodes of sharp abdominal pain lasting 20-30 minutes.  Denies any specific complaints of chest pain, vomiting, leg swelling, shortness of breath, or diarrhea.     Assessment & Plan:   Active Problems:   Hypothyroidism   Essential hypertension   Dementia   Left leg pain   Acute urinary retention   Elevated troponin   Left leg pain: Acute.   -X-rays of the left hip showed no acute signs of fracture.  CT scan did reveal signs of a chronic sacral fracture on the right side which does not appear would correlate with patient's symptoms.   -walking with PT -hold on MRI -apply lidocaine patch  Constipation -good response to suppository  Urinary retention: Acute.  with bladder wall hemorrhage after foley placement -spoke with urology can change to 20 if clots become an issue-- should resolve on own  Suspected dehydration:  -walking with PT and  nursing  Elevated troponin: Chronic.   -flat  Abnormal EKG:  -appears to be chronic  Dementia: Family reports patient has been taking Ativan at night to help with anxiety related with dementia. - Continue Ativan at night prn anxiety  Essential hypertension - Continue amlodipine   Hypothyroidism - Continue levothyroxine  Stenosis of the left subclavian artery: Chronic.  Likely cause of patient's low blood pressures on left arm.  Infrarenal AAA: Chronic.  Seen to be 3.3 cm on CT angiogram. - Continue recommended outpatient imaging  Hyperlipidemia - Continue pravastatin     DVT prophylaxis:  SCD's  Code Status: DNR   Family Communication:   Disposition Plan:     Consultants:      Subjective: Not having pain currently  Objective: Vitals:   06/16/17 2015 06/16/17 2205 06/17/17 0500 06/17/17 0816  BP:  128/74 110/69 (!) 145/54  Pulse: (!) 58 75 70 70  Resp: 14 18 19    Temp:  97.7 F (36.5 C) 97.8 F (36.6 C)   TempSrc:  Oral Oral   SpO2: 100% 94% 97% 99%  Weight:  55.6 kg (122 lb 9.6 oz)    Height:  5\' 2"  (1.575 m)      Intake/Output Summary (Last 24 hours) at 06/17/2017 1418 Last data filed at 06/17/2017 0610 Gross per 24 hour  Intake 748.75 ml  Output 1450 ml  Net -701.25 ml   Filed Weights   06/16/17 1449 06/16/17 2205  Weight: 57.2 kg (126 lb) 55.6 kg (122 lb 9.6 oz)    Examination:  General exam: Appears calm and comfortable  Respiratory system: Clear to auscultation. Respiratory effort normal. Cardiovascular system: rrr Gastrointestinal system: +BS, soft Central nervous system: pleasant Extremities: Symmetric 5 x 5 power. Skin: No rashes, lesions or ulcers     Data Reviewed: I have personally reviewed following labs and imaging studies  CBC: Recent Labs  Lab 06/16/17 1520 06/17/17 0538  WBC 5.9 6.3  HGB 12.6 11.7*  HCT 38.1 35.6*  MCV 89.6 89.4  PLT 313 277   Basic Metabolic Panel: Recent Labs  Lab  06/16/17 1520 06/17/17 0538  NA 139 138  K 3.5 3.3*  CL 101 105  CO2 28 21*  GLUCOSE 171* 84  BUN 30* 17  CREATININE 1.09* 0.83  CALCIUM 9.2 8.7*   GFR: Estimated Creatinine Clearance: 38.5 mL/min (by C-G formula based on SCr of 0.83 mg/dL). Liver Function Tests: Recent Labs  Lab 06/16/17 1520  AST 20  ALT 10*  ALKPHOS 120  BILITOT 0.6  PROT 7.0  ALBUMIN 3.4*   Recent Labs  Lab 06/16/17 1520  LIPASE 27   No results for input(s): AMMONIA in the last 168 hours. Coagulation Profile: No results for input(s): INR, PROTIME in the last 168 hours. Cardiac Enzymes: Recent Labs  Lab 06/16/17 1520 06/16/17 1930 06/17/17 0017 06/17/17 0538  TROPONINI 0.04* 0.03* 0.03* 0.03*   BNP (last 3 results) No results for input(s): PROBNP in the last 8760 hours. HbA1C: No results for input(s): HGBA1C in the last 72 hours. CBG: Recent Labs  Lab 06/16/17 2233  GLUCAP 136*   Lipid Profile: No results for input(s): CHOL, HDL, LDLCALC, TRIG, CHOLHDL, LDLDIRECT in the last 72 hours. Thyroid Function Tests: Recent Labs    06/17/17 0538  TSH 0.410   Anemia Panel: No results for input(s): VITAMINB12, FOLATE, FERRITIN, TIBC, IRON, RETICCTPCT in the last 72 hours. Urine analysis:    Component Value Date/Time   COLORURINE YELLOW 06/16/2017 1533   APPEARANCEUR CLEAR 06/16/2017 1533   LABSPEC 1.010 06/16/2017 1533   PHURINE 7.0 06/16/2017 1533   GLUCOSEU NEGATIVE 06/16/2017 1533   HGBUR LARGE (A) 06/16/2017 1533   HGBUR negative 01/19/2008 1008   BILIRUBINUR NEGATIVE 06/16/2017 1533   KETONESUR NEGATIVE 06/16/2017 1533   PROTEINUR NEGATIVE 06/16/2017 1533   UROBILINOGEN 1.0 07/04/2014 1447   NITRITE NEGATIVE 06/16/2017 1533   LEUKOCYTESUR NEGATIVE 06/16/2017 1533      Recent Results (from the past 240 hour(s))  MRSA PCR Screening     Status: None   Collection Time: 06/17/17  2:50 AM  Result Value Ref Range Status   MRSA by PCR NEGATIVE NEGATIVE Final    Comment:         The GeneXpert MRSA Assay (FDA approved for NASAL specimens only), is one component of a comprehensive MRSA colonization surveillance program. It is not intended to diagnose MRSA infection nor to guide or monitor treatment for MRSA infections.       Anti-infectives (From admission, onward)   None       Radiology Studies: Dg Chest 2 View  Result Date: 06/16/2017 CLINICAL DATA:  Dementia.  Posterior hip pain. EXAM: CHEST  2 VIEW COMPARISON:  May 15, 2017 FINDINGS: The heart size and mediastinal contours are stable. The heart size is enlarged. Both lungs are clear. The visualized skeletal structures are stable. IMPRESSION: No active cardiopulmonary disease. Electronically Signed   By: Sherian Rein M.D.   On: 06/16/2017 16:37   Ct Angio Chest/abd/pel For Dissection W And/or Wo Contrast  Result  Date: 06/16/2017 CLINICAL DATA:  Chest and back pain.  Weak left radial pulse. EXAM: CT ANGIOGRAPHY CHEST, ABDOMEN AND PELVIS TECHNIQUE: Multidetector CT imaging through the chest, abdomen and pelvis was performed using the standard protocol during bolus administration of intravenous contrast. Multiplanar reconstructed images and MIPs were obtained and reviewed to evaluate the vascular anatomy. CONTRAST:  ISOVUE-370 IOPAMIDOL (ISOVUE-370) INJECTION 76% COMPARISON:  Chest radiograph from earlier today. 05/16/2013 chest CT. 04/11/2017 CT abdomen/pelvis. FINDINGS: CTA CHEST FINDINGS Cardiovascular: Normal heart size. No significant pericardial fluid/thickening. Left main and 3 vessel coronary atherosclerosis status post CABG. Atherosclerotic nonaneurysmal thoracic aorta. No acute intramural hematoma,, dissection, pseudoaneurysm or penetrating atherosclerotic ulcer in the thoracic aorta. High-grade atherosclerotic stenosis versus occlusion of the proximal left subclavian artery. Normal caliber pulmonary arteries. No central pulmonary emboli. Mediastinum/Nodes: No discrete thyroid nodules.  Unremarkable esophagus. No pathologically enlarged axillary, mediastinal or hilar lymph nodes. Lungs/Pleura: No pneumothorax. No pleural effusion. No acute consolidative airspace disease or lung masses. Mild patchy tree-in-bud opacities are noted in the basilar right upper lobe, right middle lobe, lingula and anterior right lower lobe with associated mild cylindrical and varicoid bronchiectasis. Musculoskeletal: No aggressive appearing focal osseous lesions. Intact sternotomy wires. Moderate thoracic spondylosis. Review of the MIP images confirms the above findings. CTA ABDOMEN AND PELVIS FINDINGS VASCULAR Aorta: Prominent abdominal aortic atherosclerosis. Infrarenal 3.3 cm abdominal aortic aneurysm, stable. Penetrating atherosclerotic ulcer in the right lower infrarenal abdominal aorta measuring 1.6 x 0.8 cm, stable. No acute dissection or intramural hematoma in the abdominal aorta. Celiac: At least moderate ostial stenosis.  No dissection. SMA: Moderate ostial stenosis.  No dissection. Renals: Proximal left renal artery stent is in place. Patent single left renal artery. Two right renal arteries, both with proximal renal artery stents in place, the more superior of which appears occluded proximally and the more inferior of which appears patent. No dissection. IMA: Probable high-grade ostial stenosis.  No dissection. Inflow: High-grade stenosis with occlusion in the distal right common femoral artery, unchanged. No high-grade stenosis on the left. No dissection. No aneurysm. Veins: No obvious venous abnormality within the limitations of this arterial phase study. Review of the MIP images confirms the above findings. NON-VASCULAR Hepatobiliary: Normal liver size. Simple left liver lobe cysts, largest 1.2 cm. Granulomatous left liver lobe calcification is stable. No new liver lesions. Cholecystectomy. Bile ducts are stable and within normal post cholecystectomy limits. Pancreas: Normal, with no mass or duct dilation.  Spleen: Normal size. No mass. Adrenals/Urinary Tract: Normal adrenals. Stable asymmetric moderate right renal atrophy and scarring. No hydronephrosis. Partially exophytic simple 1.7 cm lower right renal cyst. A few tiny nonobstructing 2-3 mm stones in both kidneys. Simple 1.0 cm lower left renal cyst. Prominently distended bladder. Suggestion of mild diffuse bladder wall thickening. Moderate 3.0 cm posterior right and 3.9 cm anterior left bladder diverticulum. No focal bladder mass. Stomach/Bowel: Normal non-distended stomach. Normal caliber small bowel with no small bowel wall thickening. Appendix not discretely visualized. No pericecal inflammatory changes. Marked colonic diverticulosis, most prominent in the sigmoid colon, with no large bowel wall thickening or pericolonic fat stranding. Moderate rectal fecal distention without definite rectal wall thickening. Vascular/Lymphatic: See above for vascular findings. No pathologically enlarged lymph nodes in the abdomen or pelvis. Reproductive: Grossly normal uterus.  No adnexal mass. Other: No pneumoperitoneum, ascites or focal fluid collection. Musculoskeletal: No aggressive appearing focal osseous lesions. Asymmetric sclerosis throughout the right sacral ala compatible with chronic nondisplaced insufficiency fracture. Marked lumbar spondylosis. Review of the MIP images  confirms the above findings. IMPRESSION: 1. No acute aortic syndrome.  Aortic Atherosclerosis (ICD10-I70.0). 2. High-grade atherosclerotic stenosis versus occlusion of the proximal left subclavian artery, chronic appearing. Numerous additional chronic appearing arterial findings as detailed above. 3. Stable 3.3 cm infrarenal abdominal aortic aneurysm. Recommend followup by ultrasound in 3 years. This recommendation follows ACR consensus guidelines: White Paper of the ACR Incidental Findings Committee II on Vascular Findings. J Am Coll Radiol 2013; 10:789-794. 4. 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 5. Nonobstructing bilateral nephrolithiasis. No hydronephrosis. Prominently distended bladder with moderate bladder diverticula. Recommend clinical correlation to exclude bladder outlet obstruction. 6. Marked colonic diverticulosis. 7. Chronic right sacral insufficiency fracture. Electronically Signed   By: Delbert PhenixJason A Poff M.D.   On: 06/16/2017 17:15   Dg Hips Bilat W Or Wo Pelvis 2 Views  Result Date: 06/16/2017 CLINICAL DATA:  Bilateral hip pain for several weeks EXAM: DG HIP (WITH OR WITHOUT PELVIS) 2V BILAT COMPARISON:  June 02, 2015 FINDINGS: There is no evidence of hip fracture or dislocation. Arthritic changes of bilateral hips are noted. IMPRESSION: No acute fracture or dislocation.Arthritic changes of bilateral hips are noted. Electronically Signed   By: Sherian ReinWei-Chen  Lin M.D.   On: 06/16/2017 16:39        Scheduled Meds: . amLODipine  10 mg Oral Q breakfast  . aspirin EC  81 mg Oral Daily  . levothyroxine  125 mcg Oral QAC breakfast  . lidocaine  1 patch Transdermal Q24H  . LORazepam  0.25 mg Intravenous STAT  . metoCLOPramide (REGLAN) injection  10 mg Intravenous Once  . pneumococcal 23 valent vaccine  0.5 mL Intramuscular Tomorrow-1000  . pravastatin  40 mg Oral Daily  . senna-docusate  1 tablet Oral QHS   Continuous Infusions:   LOS: 0 days    Time spent: 35 min    Joseph ArtJessica U Javon Snee, DO [facility] Pager [phone]  If 7PM-7AM, please contact night-coverage [email or web address] Password [email or web address] 06/17/2017, 2:18 PM

## 2017-06-17 NOTE — Progress Notes (Signed)
Walked length of hall with patient.  She used front rolling walker and tolerated well. Placed ice on pt's hip. She is resting comfortably in the recliner.

## 2017-06-17 NOTE — Progress Notes (Signed)
Relayed to night team to place size 20 foley in pt if bladder scan shows 500cc or more.

## 2017-06-17 NOTE — Progress Notes (Addendum)
Ambulated with pt in hall.  She went half the hall distance.  Used rolling walker, tolerated well, however stated she was tired and left hip was hurting so wanted to go back to her room.

## 2017-06-17 NOTE — Evaluation (Signed)
Physical Therapy Evaluation Patient Details Name: Wanda Robinson MRN: [id number] DOB: 19-Apr-1929 Today's Date: 06/17/2017   History of Present Illness  83 yo female with c/o of L LE pain/inability to walk. Xray was negative for L LE fracture, positive for chronic sacral fracture. Dx with L LE pain, urinary retention, possible type II heart block, dementia, infrarenal AAA. PMH aortic stenosis, CAD, chronic back pain, carotid artery occlusion, CKD, COPD, dementia, HTN, hypothyroidism, hx MI, hx CABG, hx hernia repair with mesh   Clinical Impression   Patient received in bed, family present; note patient is poor historian and family provided majority of history today. Patient able to complete bed mobility, functional transfers, and gait with walker with min guard today; note some mild L LE pain during gait however this only mildly affected gait pattern with walker. Patient requires VC for safety and sequencing with mobility; family reports that patient required cues from staff at baseline however. Patient and family educated on performance with skilled PT services and PT recommendations moving forward- patient moving very well and seems appropriate for return to assisted living with PT. Patient left up in chair with chair alarm activated, all needs met this morning, family present with all questions/concerns addressed.     Follow Up Recommendations Supervision/Assistance - 24 hour;Supervision for mobility/OOB;Other (comment)(patient seems appropriate to return to assisted living with PT )    Equipment Recommendations  Rolling walker with 5" wheels    Recommendations for Other Services       Precautions / Restrictions Precautions Precautions: Fall Restrictions Weight Bearing Restrictions: No      Mobility  Bed Mobility Overal bed mobility: Needs Assistance Bed Mobility: Supine to Sit     Supine to sit: Min guard     General bed mobility comments: VC for encouragement, min guard for  balance   Transfers Overall transfer level: Needs assistance Equipment used: Rolling walker (2 wheeled) Transfers: Sit to/from Stand Sit to Stand: Min guard         General transfer comment: VC for safety and sequencing   Ambulation/Gait Ambulation/Gait assistance: Min guard Ambulation Distance (Feet): 30 Feet Assistive device: Rolling walker (2 wheeled) Gait Pattern/deviations: Step-through pattern;Decreased step length - right;Decreased stance time - left;Decreased weight shift to left;Narrow base of support;Trunk flexed     General Gait Details: patient reports mild LE pain L during gait, min guard for safety   Stairs            Wheelchair Mobility    Modified Rankin (Stroke Patients Only)       Balance Overall balance assessment: Needs assistance;History of Falls Sitting-balance support: Bilateral upper extremity supported;Feet supported Sitting balance-Leahy Scale: Good     Standing balance support: Bilateral upper extremity supported;During functional activity Standing balance-Leahy Scale: Good                 High Level Balance Comments: history of recent falls due to medication in November, familiy reports falls have resolved since medication was stopped              Pertinent Vitals/Pain Pain Assessment: Faces Faces Pain Scale: Hurts a little bit Pain Location: L LE during movement  Pain Descriptors / Indicators: Aching;Sore Pain Intervention(s): Limited activity within patient's tolerance;Monitored during session    Home Living Family/patient expects to be discharged to:: Assisted living               Home Equipment: Gilford Rile - 2 wheels      Prior Function  Level of Independence: Needs assistance   Gait / Transfers Assistance Needed: none  ADL's / Homemaking Assistance Needed: assistance provided by assistive living faciilty   Comments: patient with good mobility at assisted living, mostly required assistance for  dressing/bathing, verbal cues. Did not use AD. Very high level per family but mostly limited by dementia.      Hand Dominance        Extremity/Trunk Assessment   Upper Extremity Assessment Upper Extremity Assessment: Defer to OT evaluation    Lower Extremity Assessment Lower Extremity Assessment: Generalized weakness    Cervical / Trunk Assessment Cervical / Trunk Assessment: Kyphotic  Communication   Communication: No difficulties  Cognition Arousal/Alertness: Awake/alert Behavior During Therapy: WFL for tasks assessed/performed Overall Cognitive Status: History of cognitive impairments - at baseline                                        General Comments      Exercises     Assessment/Plan    PT Assessment Patient needs continued PT services  PT Problem List Decreased strength;Decreased mobility;Decreased safety awareness;Decreased coordination;Decreased balance       PT Treatment Interventions DME instruction;Therapeutic activities;Gait training;Therapeutic exercise;Patient/family education;Stair training;Balance training;Neuromuscular re-education;Functional mobility training    PT Goals (Current goals can be found in the Care Plan section)  Acute Rehab PT Goals Patient Stated Goal: to get better  PT Goal Formulation: With patient/family Time For Goal Achievement: 06/24/17 Potential to Achieve Goals: Good    Frequency Min 3X/week   Barriers to discharge        Co-evaluation               AM-PAC PT "6 Clicks" Daily Activity  Outcome Measure Difficulty turning over in bed (including adjusting bedclothes, sheets and blankets)?: Unable Difficulty moving from lying on back to sitting on the side of the bed? : Unable Difficulty sitting down on and standing up from a chair with arms (e.g., wheelchair, bedside commode, etc,.)?: Unable Help needed moving to and from a bed to chair (including a wheelchair)?: A Little Help needed walking in  hospital room?: A Little Help needed climbing 3-5 steps with a railing? : A Little 6 Click Score: 12    End of Session Equipment Utilized During Treatment: Gait belt Activity Tolerance: Patient tolerated treatment well Patient left: in chair;with chair alarm set;with family/visitor present;with call bell/phone within reach   PT Visit Diagnosis: Unsteadiness on feet (R26.81);Muscle weakness (generalized) (M62.81);Difficulty in walking, not elsewhere classified (R26.2)    Time: 8828-0034 PT Time Calculation (min) (ACUTE ONLY): 23 min   Charges:   PT Evaluation $PT Eval Moderate Complexity: 1 Mod PT Treatments $Self Care/Home Management: 8-22   PT G Codes:   PT G-Codes **NOT FOR INPATIENT CLASS** Functional Assessment Tool Used: AM-PAC 6 Clicks Basic Mobility;Clinical judgement Functional Limitation: Mobility: Walking and moving around Mobility: Walking and Moving Around Current Status (J1791): At least 20 percent but less than 40 percent impaired, limited or restricted Mobility: Walking and Moving Around Goal Status [phone]): At least 1 percent but less than 20 percent impaired, limited or restricted    Deniece Ree PT, DPT, CBIS  Supplemental Physical Therapist [facility]

## 2017-06-17 NOTE — Progress Notes (Signed)
Notified Dr. Benjamine MolaVann of pts urine color.

## 2017-06-17 NOTE — ED Notes (Signed)
Primary nurse and Dr. Lynelle DoctorKnapp aware that troponin 0.03

## 2017-06-18 DIAGNOSIS — Z23 Encounter for immunization: Secondary | ICD-10-CM | POA: Diagnosis present

## 2017-06-18 DIAGNOSIS — R338 Other retention of urine: Secondary | ICD-10-CM | POA: Diagnosis not present

## 2017-06-18 DIAGNOSIS — Z66 Do not resuscitate: Secondary | ICD-10-CM | POA: Diagnosis present

## 2017-06-18 DIAGNOSIS — E785 Hyperlipidemia, unspecified: Secondary | ICD-10-CM | POA: Diagnosis present

## 2017-06-18 DIAGNOSIS — I714 Abdominal aortic aneurysm, without rupture: Secondary | ICD-10-CM | POA: Diagnosis present

## 2017-06-18 DIAGNOSIS — I4581 Long QT syndrome: Secondary | ICD-10-CM | POA: Diagnosis present

## 2017-06-18 DIAGNOSIS — E039 Hypothyroidism, unspecified: Secondary | ICD-10-CM | POA: Diagnosis not present

## 2017-06-18 DIAGNOSIS — Z951 Presence of aortocoronary bypass graft: Secondary | ICD-10-CM | POA: Diagnosis not present

## 2017-06-18 DIAGNOSIS — J449 Chronic obstructive pulmonary disease, unspecified: Secondary | ICD-10-CM | POA: Diagnosis present

## 2017-06-18 DIAGNOSIS — I350 Nonrheumatic aortic (valve) stenosis: Secondary | ICD-10-CM | POA: Diagnosis present

## 2017-06-18 DIAGNOSIS — Y732 Prosthetic and other implants, materials and accessory gastroenterology and urology devices associated with adverse incidents: Secondary | ICD-10-CM | POA: Diagnosis not present

## 2017-06-18 DIAGNOSIS — I701 Atherosclerosis of renal artery: Secondary | ICD-10-CM | POA: Diagnosis present

## 2017-06-18 DIAGNOSIS — I2510 Atherosclerotic heart disease of native coronary artery without angina pectoris: Secondary | ICD-10-CM | POA: Diagnosis present

## 2017-06-18 DIAGNOSIS — R110 Nausea: Secondary | ICD-10-CM | POA: Diagnosis present

## 2017-06-18 DIAGNOSIS — R748 Abnormal levels of other serum enzymes: Secondary | ICD-10-CM

## 2017-06-18 DIAGNOSIS — M79605 Pain in left leg: Secondary | ICD-10-CM | POA: Diagnosis present

## 2017-06-18 DIAGNOSIS — I6529 Occlusion and stenosis of unspecified carotid artery: Secondary | ICD-10-CM | POA: Diagnosis present

## 2017-06-18 DIAGNOSIS — E860 Dehydration: Secondary | ICD-10-CM | POA: Diagnosis present

## 2017-06-18 DIAGNOSIS — F0151 Vascular dementia with behavioral disturbance: Secondary | ICD-10-CM | POA: Diagnosis not present

## 2017-06-18 DIAGNOSIS — I441 Atrioventricular block, second degree: Secondary | ICD-10-CM | POA: Diagnosis present

## 2017-06-18 DIAGNOSIS — F05 Delirium due to known physiological condition: Secondary | ICD-10-CM | POA: Diagnosis present

## 2017-06-18 DIAGNOSIS — I10 Essential (primary) hypertension: Secondary | ICD-10-CM | POA: Diagnosis not present

## 2017-06-18 DIAGNOSIS — F0390 Unspecified dementia without behavioral disturbance: Secondary | ICD-10-CM | POA: Diagnosis present

## 2017-06-18 DIAGNOSIS — R8271 Bacteriuria: Secondary | ICD-10-CM | POA: Diagnosis present

## 2017-06-18 DIAGNOSIS — F419 Anxiety disorder, unspecified: Secondary | ICD-10-CM | POA: Diagnosis present

## 2017-06-18 DIAGNOSIS — Y92230 Patient room in hospital as the place of occurrence of the external cause: Secondary | ICD-10-CM | POA: Diagnosis not present

## 2017-06-18 DIAGNOSIS — M8448XA Pathological fracture, other site, initial encounter for fracture: Secondary | ICD-10-CM | POA: Diagnosis present

## 2017-06-18 DIAGNOSIS — R339 Retention of urine, unspecified: Secondary | ICD-10-CM | POA: Diagnosis present

## 2017-06-18 DIAGNOSIS — T8383XA Hemorrhage of genitourinary prosthetic devices, implants and grafts, initial encounter: Secondary | ICD-10-CM | POA: Diagnosis not present

## 2017-06-18 LAB — BASIC METABOLIC PANEL
Anion gap: 9 (ref 5–15)
BUN: 15 mg/dL (ref 6–20)
CO2: 25 mmol/L (ref 22–32)
CREATININE: 0.83 mg/dL (ref 0.44–1.00)
Calcium: 8.7 mg/dL — ABNORMAL LOW (ref 8.9–10.3)
Chloride: 102 mmol/L (ref 101–111)
GFR calc Af Amer: >60 mL/min (ref >60)
Glucose, Bld: 93 mg/dL (ref 65–99)
POTASSIUM: 3.2 mmol/L — AB (ref 3.5–5.1)
Sodium: 136 mmol/L (ref 135–145)

## 2017-06-18 LAB — CBC
HCT: 34.2 % — ABNORMAL LOW (ref 36.0–46.0)
Hemoglobin: 11.2 g/dL — ABNORMAL LOW (ref 12.0–15.0)
MCH: 29 pg (ref 26.0–34.0)
MCHC: 32.7 g/dL (ref 30.0–36.0)
MCV: 88.6 fL (ref 78.0–100.0)
PLATELETS: 266 10*3/uL (ref 150–400)
RBC: 3.86 MIL/uL — AB (ref 3.87–5.11)
RDW: 13.7 % (ref 11.5–15.5)
WBC: 5.8 10*3/uL (ref 4.0–10.5)

## 2017-06-18 MED ORDER — TRAMADOL HCL 50 MG PO TABS: 50.0000 mg | ORAL_TABLET | Freq: Two times a day (BID) | ORAL | Status: DC | PRN

## 2017-06-18 MED ORDER — BISACODYL 10 MG RE SUPP: 10.0000 mg | Freq: Every day | RECTAL | Status: DC | PRN

## 2017-06-18 MED ORDER — POTASSIUM CHLORIDE CRYS ER 20 MEQ PO TBCR
40.0000 meq | EXTENDED_RELEASE_TABLET | Freq: Once | ORAL | Status: AC
  Administered 2017-06-18: 40 meq via ORAL
  Filled 2017-06-18: qty 2

## 2017-06-18 NOTE — Progress Notes (Addendum)
PROGRESS NOTE    Wanda HeathMary M Fehrman  ZOX:[id number] DOB: Feb 17, 1929 DOA: 06/16/2017 PCP: Bailey MechPodraza, Cole Christopher, PA-C   Outpatient Specialists:     Brief Narrative:  Wanda Robinson is a 84 y.o. female with medical history significant of dementia, carotid stenosis, CAD, AS,andhypothyroidism;who initially presented with complaints of left leg pain and inability to walk.  History is obtained from the patient's family members who are present at bedside as patient has dementia and is unable to give her own.  [facility] and normally able to ambulate without need of assistance.  However since yesterday patient has been unable to bear weight on the left leg.  X-rays performed showed no acute signs of a fracture.  Family notes that the patient chronically has issues with back pain.  Family notes the last known fall to occurred 1 month ago for which she was evaluated and noted to have no acute fractures at that time.  Other associated symptoms included complaints of nausea and intermittent episodes of sharp abdominal pain lasting 20-30 minutes.  Denies any specific complaints of chest pain, vomiting, leg swelling, shortness of breath, or diarrhea.     Assessment & Plan:   Active Problems:   Hypothyroidism   Essential hypertension   Dementia   Left leg pain   Acute urinary retention   Elevated troponin   Left leg pain: Acute.   -X-rays of the left hip showed no acute signs of fracture.  CT scan did reveal signs of a chronic sacral fracture on the right side which does not appear would correlate with patient's symptoms.   -walking with PT -hold on MRI -apply lidocaine patch  Constipation -good response to suppository  Urinary retention: Acute.  with bladder wall hemorrhage after foley placement -patient has pulled out 2 foleys -may need I/O until follow up with urology if not able to void on own  Suspected dehydration:   -resolved  Elevated troponin: Chronic.   -flat  Abnormal EKG:  -appears to be chronic  Dementia: Family reports patient has been taking Ativan at night to help with anxiety related with dementia. - Continue Ativan at night prn anxiety  Essential hypertension - Continue amlodipine   Hypothyroidism - Continue levothyroxine  Stenosis of the left subclavian artery: Chronic.  Likely cause of patient's low blood pressures on left arm.  Infrarenal AAA: Chronic.  Seen to be 3.3 cm on CT angiogram. - Continue recommended outpatient imaging  Hyperlipidemia - Continue pravastatin  Hypokalemia -replete  Patient not safe for d/c as not voiding, has pulled out 2 foleys.  ALF unable to do I/Os.  Ideally would like to send back to ALF as with her dementia would do better but may need SNF.  Had >500 PVR today  DVT prophylaxis:  SCD's  Code Status: DNR   Family Communication:   Disposition Plan:     Consultants:      Subjective: Pulled out foley this AM  Objective: Vitals:   06/17/17 0500 06/17/17 0816 06/17/17 2006 06/18/17 0319  BP: 110/69 (!) 145/54 108/67 (!) 118/43  Pulse: 70 70 77 63  Resp: 19  18 16   Temp: 97.8 F (36.6 C)  97.8 F (36.6 C) (!) 97.5 F (36.4 C)  TempSrc: Oral  Oral Oral  SpO2: 97% 99% 95% 96%  Weight:    54.7 kg (120 lb 11.2 oz)  Height:        Intake/Output Summary (Last 24 hours) at 06/18/2017 1338 Last  data filed at 06/18/2017 1331 Gross per 24 hour  Intake 360 ml  Output 1800 ml  Net -1440 ml   Filed Weights   06/16/17 1449 06/16/17 2205 06/18/17 0319  Weight: 57.2 kg (126 lb) 55.6 kg (122 lb 9.6 oz) 54.7 kg (120 lb 11.2 oz)    Examination:  General exam: in bed, NAD Respiratory system: clear, no wheezing Cardiovascular system: rrr Gastrointestinal system: NT Central nervous system: pleasantly confused Extremities: no edema      Data Reviewed: I have personally reviewed following labs and imaging  studies  CBC: Recent Labs  Lab 06/16/17 1520 06/17/17 0538 06/18/17 0433  WBC 5.9 6.3 5.8  HGB 12.6 11.7* 11.2*  HCT 38.1 35.6* 34.2*  MCV 89.6 89.4 88.6  PLT 313 277 266   Basic Metabolic Panel: Recent Labs  Lab 06/16/17 1520 06/17/17 0538 06/18/17 0433  NA 139 138 136  K 3.5 3.3* 3.2*  CL 101 105 102  CO2 28 21* 25  GLUCOSE 171* 84 93  BUN 30* 17 15  CREATININE 1.09* 0.83 0.83  CALCIUM 9.2 8.7* 8.7*   GFR: Estimated Creatinine Clearance: 38.5 mL/min (by C-G formula based on SCr of 0.83 mg/dL). Liver Function Tests: Recent Labs  Lab 06/16/17 1520  AST 20  ALT 10*  ALKPHOS 120  BILITOT 0.6  PROT 7.0  ALBUMIN 3.4*   Recent Labs  Lab 06/16/17 1520  LIPASE 27   No results for input(s): AMMONIA in the last 168 hours. Coagulation Profile: No results for input(s): INR, PROTIME in the last 168 hours. Cardiac Enzymes: Recent Labs  Lab 06/16/17 1520 06/16/17 1930 06/17/17 0017 06/17/17 0538  TROPONINI 0.04* 0.03* 0.03* 0.03*   BNP (last 3 results) No results for input(s): PROBNP in the last 8760 hours. HbA1C: No results for input(s): HGBA1C in the last 72 hours. CBG: Recent Labs  Lab 06/16/17 2233  GLUCAP 136*   Lipid Profile: No results for input(s): CHOL, HDL, LDLCALC, TRIG, CHOLHDL, LDLDIRECT in the last 72 hours. Thyroid Function Tests: Recent Labs    06/17/17 0538  TSH 0.410   Anemia Panel: No results for input(s): VITAMINB12, FOLATE, FERRITIN, TIBC, IRON, RETICCTPCT in the last 72 hours. Urine analysis:    Component Value Date/Time   COLORURINE YELLOW 06/16/2017 1533   APPEARANCEUR CLEAR 06/16/2017 1533   LABSPEC 1.010 06/16/2017 1533   PHURINE 7.0 06/16/2017 1533   GLUCOSEU NEGATIVE 06/16/2017 1533   HGBUR LARGE (A) 06/16/2017 1533   HGBUR negative 01/19/2008 1008   BILIRUBINUR NEGATIVE 06/16/2017 1533   KETONESUR NEGATIVE 06/16/2017 1533   PROTEINUR NEGATIVE 06/16/2017 1533   UROBILINOGEN 1.0 07/04/2014 1447   NITRITE  NEGATIVE 06/16/2017 1533   LEUKOCYTESUR NEGATIVE 06/16/2017 1533      Recent Results (from the past 240 hour(s))  MRSA PCR Screening     Status: None   Collection Time: 06/17/17  2:50 AM  Result Value Ref Range Status   MRSA by PCR NEGATIVE NEGATIVE Final    Comment:        The GeneXpert MRSA Assay (FDA approved for NASAL specimens only), is one component of a comprehensive MRSA colonization surveillance program. It is not intended to diagnose MRSA infection nor to guide or monitor treatment for MRSA infections.       Anti-infectives (From admission, onward)   None       Radiology Studies: Dg Chest 2 View  Result Date: 06/16/2017 CLINICAL DATA:  Dementia.  Posterior hip pain. EXAM: CHEST  2 VIEW COMPARISON:  May 15, 2017 FINDINGS: The heart size and mediastinal contours are stable. The heart size is enlarged. Both lungs are clear. The visualized skeletal structures are stable. IMPRESSION: No active cardiopulmonary disease. Electronically Signed   By: Sherian Rein M.D.   On: 06/16/2017 16:37   Ct Angio Chest/abd/pel For Dissection W And/or Wo Contrast  Result Date: 06/16/2017 CLINICAL DATA:  Chest and back pain.  Weak left radial pulse. EXAM: CT ANGIOGRAPHY CHEST, ABDOMEN AND PELVIS TECHNIQUE: Multidetector CT imaging through the chest, abdomen and pelvis was performed using the standard protocol during bolus administration of intravenous contrast. Multiplanar reconstructed images and MIPs were obtained and reviewed to evaluate the vascular anatomy. CONTRAST:  ISOVUE-370 IOPAMIDOL (ISOVUE-370) INJECTION 76% COMPARISON:  Chest radiograph from earlier today. 05/16/2013 chest CT. 04/11/2017 CT abdomen/pelvis. FINDINGS: CTA CHEST FINDINGS Cardiovascular: Normal heart size. No significant pericardial fluid/thickening. Left main and 3 vessel coronary atherosclerosis status post CABG. Atherosclerotic nonaneurysmal thoracic aorta. No acute intramural hematoma,, dissection,  pseudoaneurysm or penetrating atherosclerotic ulcer in the thoracic aorta. High-grade atherosclerotic stenosis versus occlusion of the proximal left subclavian artery. Normal caliber pulmonary arteries. No central pulmonary emboli. Mediastinum/Nodes: No discrete thyroid nodules. Unremarkable esophagus. No pathologically enlarged axillary, mediastinal or hilar lymph nodes. Lungs/Pleura: No pneumothorax. No pleural effusion. No acute consolidative airspace disease or lung masses. Mild patchy tree-in-bud opacities are noted in the basilar right upper lobe, right middle lobe, lingula and anterior right lower lobe with associated mild cylindrical and varicoid bronchiectasis. Musculoskeletal: No aggressive appearing focal osseous lesions. Intact sternotomy wires. Moderate thoracic spondylosis. Review of the MIP images confirms the above findings. CTA ABDOMEN AND PELVIS FINDINGS VASCULAR Aorta: Prominent abdominal aortic atherosclerosis. Infrarenal 3.3 cm abdominal aortic aneurysm, stable. Penetrating atherosclerotic ulcer in the right lower infrarenal abdominal aorta measuring 1.6 x 0.8 cm, stable. No acute dissection or intramural hematoma in the abdominal aorta. Celiac: At least moderate ostial stenosis.  No dissection. SMA: Moderate ostial stenosis.  No dissection. Renals: Proximal left renal artery stent is in place. Patent single left renal artery. Two right renal arteries, both with proximal renal artery stents in place, the more superior of which appears occluded proximally and the more inferior of which appears patent. No dissection. IMA: Probable high-grade ostial stenosis.  No dissection. Inflow: High-grade stenosis with occlusion in the distal right common femoral artery, unchanged. No high-grade stenosis on the left. No dissection. No aneurysm. Veins: No obvious venous abnormality within the limitations of this arterial phase study. Review of the MIP images confirms the above findings. NON-VASCULAR  Hepatobiliary: Normal liver size. Simple left liver lobe cysts, largest 1.2 cm. Granulomatous left liver lobe calcification is stable. No new liver lesions. Cholecystectomy. Bile ducts are stable and within normal post cholecystectomy limits. Pancreas: Normal, with no mass or duct dilation. Spleen: Normal size. No mass. Adrenals/Urinary Tract: Normal adrenals. Stable asymmetric moderate right renal atrophy and scarring. No hydronephrosis. Partially exophytic simple 1.7 cm lower right renal cyst. A few tiny nonobstructing 2-3 mm stones in both kidneys. Simple 1.0 cm lower left renal cyst. Prominently distended bladder. Suggestion of mild diffuse bladder wall thickening. Moderate 3.0 cm posterior right and 3.9 cm anterior left bladder diverticulum. No focal bladder mass. Stomach/Bowel: Normal non-distended stomach. Normal caliber small bowel with no small bowel wall thickening. Appendix not discretely visualized. No pericecal inflammatory changes. Marked colonic diverticulosis, most prominent in the sigmoid colon, with no large bowel wall thickening or pericolonic fat stranding. Moderate rectal fecal distention without definite rectal wall thickening. Vascular/Lymphatic:  See above for vascular findings. No pathologically enlarged lymph nodes in the abdomen or pelvis. Reproductive: Grossly normal uterus.  No adnexal mass. Other: No pneumoperitoneum, ascites or focal fluid collection. Musculoskeletal: No aggressive appearing focal osseous lesions. Asymmetric sclerosis throughout the right sacral ala compatible with chronic nondisplaced insufficiency fracture. Marked lumbar spondylosis. Review of the MIP images confirms the above findings. IMPRESSION: 1. No acute aortic syndrome.  Aortic Atherosclerosis (ICD10-I70.0). 2. High-grade atherosclerotic stenosis versus occlusion of the proximal left subclavian artery, chronic appearing. Numerous additional chronic appearing arterial findings as detailed above. 3. Stable 3.3 cm  infrarenal abdominal aortic aneurysm. Recommend followup by ultrasound in 3 years. This recommendation follows ACR consensus guidelines: White Paper of the ACR Incidental Findings Committee II on Vascular Findings. J Am Coll Radiol 2013; 10:789-794. 4. 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 5. Nonobstructing bilateral nephrolithiasis. No hydronephrosis. Prominently distended bladder with moderate bladder diverticula. Recommend clinical correlation to exclude bladder outlet obstruction. 6. Marked colonic diverticulosis. 7. Chronic right sacral insufficiency fracture. Electronically Signed   By: Delbert Phenix M.D.   On: 06/16/2017 17:15   Dg Hips Bilat W Or Wo Pelvis 2 Views  Result Date: 06/16/2017 CLINICAL DATA:  Bilateral hip pain for several weeks EXAM: DG HIP (WITH OR WITHOUT PELVIS) 2V BILAT COMPARISON:  June 02, 2015 FINDINGS: There is no evidence of hip fracture or dislocation. Arthritic changes of bilateral hips are noted. IMPRESSION: No acute fracture or dislocation.Arthritic changes of bilateral hips are noted. Electronically Signed   By: Sherian Rein M.D.   On: 06/16/2017 16:39        Scheduled Meds: . amLODipine  10 mg Oral Q breakfast  . aspirin EC  81 mg Oral Daily  . levothyroxine  125 mcg Oral QAC breakfast  . lidocaine  1 patch Transdermal Q24H  . metoCLOPramide (REGLAN) injection  10 mg Intravenous Once  . pravastatin  40 mg Oral Daily  . senna-docusate  1 tablet Oral QHS   Continuous Infusions:   LOS: 0 days    Time spent: 35 min    Joseph Art, DO [facility] Pager [phone]  If 7PM-7AM, please contact night-coverage [email or web address] Password [email or web address] 06/18/2017, 1:38 PM

## 2017-06-18 NOTE — Progress Notes (Signed)
Foley placed per order. 600cc bloody output. Patient reports no discomfort at this time. Will continue to monitor patient.

## 2017-06-18 NOTE — Progress Notes (Addendum)
Notified [facility] on call of failed foley attempt x2. No new orders at this time. Will continue to monitor patient.

## 2017-06-18 NOTE — Progress Notes (Signed)
Two RNs attempted to place 20 F foley twice with no success.

## 2017-06-19 MED ORDER — SENNOSIDES-DOCUSATE SODIUM 8.6-50 MG PO TABS
1.0000 | ORAL_TABLET | Freq: Two times a day (BID) | ORAL | Status: DC
  Administered 2017-06-19: 1 via ORAL
  Filled 2017-06-19: qty 1

## 2017-06-19 MED ORDER — SENNOSIDES-DOCUSATE SODIUM 8.6-50 MG PO TABS: 1.0000 | ORAL_TABLET | Freq: Every evening | ORAL | Status: DC | PRN

## 2017-06-19 MED ORDER — RISPERIDONE 0.5 MG PO TBDP
0.5000 mg | ORAL_TABLET | Freq: Every day | ORAL | Status: DC
  Filled 2017-06-19 (×3): qty 1

## 2017-06-19 NOTE — Clinical Social Work Note (Signed)
ALF cannot do in and out caths. CSW paged MD to notify.  Charlynn CourtSarah Vernadette Stutsman, CSW [phone]

## 2017-06-19 NOTE — Clinical Social Work Note (Signed)
Clinical Social Work Assessment  Patient Details  Name: Wanda HeathMary M Robinson MRN: [id number] Date of Birth: 03-Nov-1928  Date of referral:  06/19/17               Reason for consult:  Discharge Planning                Permission sought to share information with:  Facility Medical sales representativeContact Representative, Family Supports Permission granted to share information::  Yes, Verbal Permission Granted  Name::     Wanda BudgeCathy Robinson  [facility]  Relationship::  Daughter  Contact Information:  [phone]  Housing/Transportation Living arrangements for the past 2 months:  Assisted Living Facility Source of Information:  Medical Team, Adult Children, Facility Patient Interpreter Needed:  None Criminal Activity/Legal Involvement Pertinent to Current Situation/Hospitalization:  No - Comment as needed Significant Relationships:  Adult Children Lives with:  Facility Resident Do you feel safe going back to the place where you live?  Yes Need for family participation in patient care:  Yes (Comment)  Care giving concerns:  [facility].   Social Worker assessment / plan:  Patient only oriented to self. No supports at bedside. CSW called patient's daughter, introduced role, and explained that discharge planning would be discussed. Patient's daughter confirmed that [facility] and plans to return once stable for discharge. Patient's daughter will transport back to the facility. CSW left voicemail for admissions staff at the facility to see if they can in and out cath her there. No further concerns. CSW encouraged patient's daughter to contact CSW as needed. CSW will continue to follow patient and her daughter for support and facilitate discharge to ALF once medically stable.  Employment status:  Retired Database administratornsurance information:  Managed Medicare PT Recommendations:  Home with Home Health Information / Referral to community resources:  Other (Comment  Required)(Plans to return to ALF)  Patient/Family's Response to care:  Patient not fully oriented. Patient's daughter agreeable to return to ALF. Patient's children supportive and involved in patient's care. Patient's daughter appreciated social work intervention.  Patient/Family's Understanding of and Emotional Response to Diagnosis, Current Treatment, and Prognosis:  Patient not fully oriented. Patient's daughter has a good understanding of the reason for admission and the plan to return to ALF once stable for discharge. Patient's daughter appears happy with hospital care.  Emotional Assessment Appearance:  Appears stated age Attitude/Demeanor/Rapport:  Unable to Assess Affect (typically observed):  Unable to Assess Orientation:  Oriented to Self Alcohol / Substance use:  Never Used Psych involvement (Current and /or in the community):  No (Comment)  Discharge Needs  Concerns to be addressed:  Care Coordination Readmission within the last 30 days:  No Current discharge risk:  Cognitively Impaired Barriers to Discharge:  Continued Medical Work up   Margarito LinerSarah C Render Marley, LCSW 06/19/2017, 9:53 AM

## 2017-06-19 NOTE — Progress Notes (Signed)
No unassisted output. Bladder scan reveals <33700ml urine.   No distention is noted and patient denies pain.   Pt's daughter is at bedside, she reports pt does not verbalize discomfort typically, but that her body language does exhibit discomfort by reaching to lower back with hand. Pt is sleeping; woke for scan and resumed rest. Lidocaine patch will be applied when pt is more awake, and pt's daughter agrees pt is ok to receive risperidone a little later since she is already resting.

## 2017-06-19 NOTE — Progress Notes (Signed)
Patient redressed after removing leads and gown.  Leads replaced. Pt agreed to relax in bed until RN return.   CNA completed bladder scan, was < 300 will not intervene at this time, per policy and due to pt borderline cooperative.

## 2017-06-19 NOTE — Progress Notes (Signed)
Physical Therapy Treatment Patient Details Name: Wanda Robinson MRN: [id number] DOB: 03-Oct-1928 Today's Date: 06/19/2017    History of Present Illness 84 yo female with c/o of L LE pain/inability to walk. Xray was negative for L LE fracture, positive for chronic sacral fracture. Dx with L LE pain, urinary retention, possible type II heart block, dementia, infrarenal AAA. PMH aortic stenosis, CAD, chronic back pain, carotid artery occlusion, CKD, COPD, dementia, HTN, hypothyroidism, hx MI, hx CABG, hx hernia repair with mesh     PT Comments    Patient received in bed, family present, patient pleasantly confused this morning. Able to perform bed mobility with ongoing min guard, however note some posterior LOB when attempting sit to stand from low bed requiring Min guard, patient otherwise able to correct. Able to gait train approximately 335f this morning with min guard, noting mild R/L drifting but no significant unsteadiness during gait period; mild antalgic pattern at beginning of ambulation but this improved as gait continued. Patient left up in chair with family in room, chair alarm activated, all other questions/concerns addressed and needs met.     Follow Up Recommendations  Supervision/Assistance - 24 hour;Supervision for mobility/OOB;Other (comment)     Equipment Recommendations  Rolling walker with 5" wheels    Recommendations for Other Services       Precautions / Restrictions Precautions Precautions: Fall Restrictions Weight Bearing Restrictions: No    Mobility  Bed Mobility Overal bed mobility: Needs Assistance Bed Mobility: Supine to Sit     Supine to sit: Min guard        Transfers Overall transfer level: Needs assistance Equipment used: Rolling walker (2 wheeled) Transfers: Sit to/from Stand Sit to Stand: Min guard         General transfer comment: VC for safety and sequencing, some posterior LOB today when attempting to stand from low bed requiring Min  guard for safety  Ambulation/Gait Ambulation/Gait assistance: Min guard Ambulation Distance (Feet): 300 Feet Assistive device: Rolling walker (2 wheeled) Gait Pattern/deviations: Step-through pattern;Decreased step length - right;Decreased stance time - left;Decreased weight shift to left;Narrow base of support;Trunk flexed     General Gait Details: mild drift L/R noted during gait today, slightly antalgic at beginning of ambulation but this improved as gait continued; tolerated walk well overall    Stairs            Wheelchair Mobility    Modified Rankin (Stroke Patients Only)       Balance Overall balance assessment: Needs assistance;History of Falls Sitting-balance support: Bilateral upper extremity supported;Feet supported Sitting balance-Leahy Scale: Good     Standing balance support: Bilateral upper extremity supported;During functional activity Standing balance-Leahy Scale: Good                 High Level Balance Comments: per family and nursing notes, patient has been pulling at lines/leads             Cognition Arousal/Alertness: Awake/alert Behavior During Therapy: WFL for tasks assessed/performed Overall Cognitive Status: Within Functional Limits for tasks assessed                                        Exercises      General Comments        Pertinent Vitals/Pain Pain Assessment: Faces Faces Pain Scale: No hurt Pain Intervention(s): Monitored during session;Limited activity within patient's tolerance    Home  Living                      Prior Function            PT Goals (current goals can now be found in the care plan section) Acute Rehab PT Goals Patient Stated Goal: to get better  PT Goal Formulation: With patient/family Time For Goal Achievement: 06/24/17 Potential to Achieve Goals: Good Progress towards PT goals: Progressing toward goals    Frequency    Min 3X/week      PT Plan Current  plan remains appropriate    Co-evaluation              AM-PAC PT "6 Clicks" Daily Activity  Outcome Measure  Difficulty turning over in bed (including adjusting bedclothes, sheets and blankets)?: Unable Difficulty moving from lying on back to sitting on the side of the bed? : Unable Difficulty sitting down on and standing up from a chair with arms (e.g., wheelchair, bedside commode, etc,.)?: Unable Help needed moving to and from a bed to chair (including a wheelchair)?: A Little Help needed walking in hospital room?: A Little Help needed climbing 3-5 steps with a railing? : A Little 6 Click Score: 12    End of Session Equipment Utilized During Treatment: Gait belt Activity Tolerance: Patient tolerated treatment well Patient left: in chair;with chair alarm set;with call bell/phone within reach;with family/visitor present   PT Visit Diagnosis: Unsteadiness on feet (R26.81);Muscle weakness (generalized) (M62.81);Difficulty in walking, not elsewhere classified (R26.2)     Time: 1470-9295 PT Time Calculation (min) (ACUTE ONLY): 18 min  Charges:  $Gait Training: 8-22 mins                    G Codes:       Deniece Ree PT, DPT, CBIS  Supplemental Physical Therapist [place]

## 2017-06-19 NOTE — Progress Notes (Signed)
IV access re-wrapped to cover to prevent removal by patient.   Pt still denies pain, no abdominal/pelvic distention noted. No unassisted urinary output. Per Order modification/MD verbal note- let pt get to before in/out- to see if higher volume encourages unassisted void.

## 2017-06-19 NOTE — Progress Notes (Signed)
PROGRESS NOTE    Wanda Robinson  ZOX:[id number] DOB: Aug 14, 1928 DOA: 06/16/2017 PCP: Bailey MechPodraza, Cole Christopher, PA-C   Outpatient Specialists:     Brief Narrative:  Wanda HeathMary M Robinson is a 84 y.o. female with medical history significant of dementia, carotid stenosis, CAD, AS,andhypothyroidism;who initially presented with complaints of left leg pain and inability to walk.  History is obtained from the patient's family members who are present at bedside as patient has dementia and is unable to give her own.  [facility] and normally able to ambulate without need of assistance.  However since yesterday patient has been unable to bear weight on the left leg.  X-rays performed showed no acute signs of a fracture.  Family notes that the patient chronically has issues with back pain.  Family notes the last known fall to occurred 1 month ago for which she was evaluated and noted to have no acute fractures at that time.  Other associated symptoms included complaints of nausea and intermittent episodes of sharp abdominal pain lasting 20-30 minutes.  Denies any specific complaints of chest pain, vomiting, leg swelling, shortness of breath, or diarrhea.     Assessment & Plan:   Active Problems:   Hypothyroidism   Essential hypertension   Dementia   Left leg pain   Acute urinary retention   Elevated troponin   Lower back pain: Acute.   -X-rays of the left hip showed no acute signs of fracture.  CT scan did reveal signs of a chronic sacral fracture on the right side which does not appear would correlate with patient's symptoms.   -pain much improved -walking with PT -apply lidocaine patch  Constipation -good response to suppository -bowel regimen  Urinary retention: Acute.  with bladder wall hemorrhage after foley placement -patient has pulled out 2 foleys -using I/Os but allowing patient to get to 600 ml before-- she did void on her own about  250 yesterday but had 400 residual  Suspected dehydration:  -resolved  Elevated troponin: Chronic.   -flat  Abnormal EKG:  -appears to be chronic  Dementia: Family reports patient has been taking Ativan at night to help with anxiety related with dementia. - had falls after ativan started-- will d/c -try low dose riperdol for sundowning (will watch closely on tele)  Essential hypertension - Continue amlodipine   Hypothyroidism - Continue levothyroxine  Stenosis of the left subclavian artery: Chronic.  Likely cause of patient's low blood pressures on left arm.  Infrarenal AAA: Chronic.  Seen to be 3.3 cm on CT angiogram. - Continue recommended outpatient imaging  Hyperlipidemia - Continue pravastatin  Hypokalemia -replete  Patient not safe for d/c as not voiding, has pulled out 2 foleys.  ALF unable to do I/Os.  Ideally would like to send back to ALF as with her dementia would do better but may need SNF-- continue to try to train bladder  DVT prophylaxis:  SCD's  Code Status: DNR   Family Communication: At bedside  Disposition Plan:     Consultants:      Subjective: Walking well with PT  Objective: Vitals:   06/18/17 2100 06/19/17 0429 06/19/17 1100 06/19/17 1204  BP: 112/86 (!) 125/49 (!) 150/76 (!) 152/48  Pulse: 83 61  (!) 54  Resp: 16 16  18   Temp: 98.6 F (37 C) 98.8 F (37.1 C)  97.8 F (36.6 C)  TempSrc: Oral Oral  Oral  SpO2: 99% 100%  100%  Weight:  Height:        Intake/Output Summary (Last 24 hours) at 06/19/2017 1501 Last data filed at 06/19/2017 1413 Gross per 24 hour  Intake 660 ml  Output 2100 ml  Net -1440 ml   Filed Weights   06/16/17 1449 06/16/17 2205 06/18/17 0319  Weight: 57.2 kg (126 lb) 55.6 kg (122 lb 9.6 oz) 54.7 kg (120 lb 11.2 oz)    Examination:  General exam: pleasantly confused Respiratory system: clear Cardiovascular system: rrr Gastrointestinal system: +BS, soft Extremities: no LE  edema      Data Reviewed: I have personally reviewed following labs and imaging studies  CBC: Recent Labs  Lab 06/16/17 1520 06/17/17 0538 06/18/17 0433  WBC 5.9 6.3 5.8  HGB 12.6 11.7* 11.2*  HCT 38.1 35.6* 34.2*  MCV 89.6 89.4 88.6  PLT 313 277 266   Basic Metabolic Panel: Recent Labs  Lab 06/16/17 1520 06/17/17 0538 06/18/17 0433  NA 139 138 136  K 3.5 3.3* 3.2*  CL 101 105 102  CO2 28 21* 25  GLUCOSE 171* 84 93  BUN 30* 17 15  CREATININE 1.09* 0.83 0.83  CALCIUM 9.2 8.7* 8.7*   GFR: Estimated Creatinine Clearance: 38.5 mL/min (by C-G formula based on SCr of 0.83 mg/dL). Liver Function Tests: Recent Labs  Lab 06/16/17 1520  AST 20  ALT 10*  ALKPHOS 120  BILITOT 0.6  PROT 7.0  ALBUMIN 3.4*   Recent Labs  Lab 06/16/17 1520  LIPASE 27   No results for input(s): AMMONIA in the last 168 hours. Coagulation Profile: No results for input(s): INR, PROTIME in the last 168 hours. Cardiac Enzymes: Recent Labs  Lab 06/16/17 1520 06/16/17 1930 06/17/17 0017 06/17/17 0538  TROPONINI 0.04* 0.03* 0.03* 0.03*   BNP (last 3 results) No results for input(s): PROBNP in the last 8760 hours. HbA1C: No results for input(s): HGBA1C in the last 72 hours. CBG: Recent Labs  Lab 06/16/17 2233  GLUCAP 136*   Lipid Profile: No results for input(s): CHOL, HDL, LDLCALC, TRIG, CHOLHDL, LDLDIRECT in the last 72 hours. Thyroid Function Tests: Recent Labs    06/17/17 0538  TSH 0.410   Anemia Panel: No results for input(s): VITAMINB12, FOLATE, FERRITIN, TIBC, IRON, RETICCTPCT in the last 72 hours. Urine analysis:    Component Value Date/Time   COLORURINE YELLOW 06/16/2017 1533   APPEARANCEUR CLEAR 06/16/2017 1533   LABSPEC 1.010 06/16/2017 1533   PHURINE 7.0 06/16/2017 1533   GLUCOSEU NEGATIVE 06/16/2017 1533   HGBUR LARGE (A) 06/16/2017 1533   HGBUR negative 01/19/2008 1008   BILIRUBINUR NEGATIVE 06/16/2017 1533   KETONESUR NEGATIVE 06/16/2017 1533    PROTEINUR NEGATIVE 06/16/2017 1533   UROBILINOGEN 1.0 07/04/2014 1447   NITRITE NEGATIVE 06/16/2017 1533   LEUKOCYTESUR NEGATIVE 06/16/2017 1533      Recent Results (from the past 240 hour(s))  MRSA PCR Screening     Status: None   Collection Time: 06/17/17  2:50 AM  Result Value Ref Range Status   MRSA by PCR NEGATIVE NEGATIVE Final    Comment:        The GeneXpert MRSA Assay (FDA approved for NASAL specimens only), is one component of a comprehensive MRSA colonization surveillance program. It is not intended to diagnose MRSA infection nor to guide or monitor treatment for MRSA infections.       Anti-infectives (From admission, onward)   None       Radiology Studies: No results found.      Scheduled Meds: .  amLODipine  10 mg Oral Q breakfast  . aspirin EC  81 mg Oral Daily  . levothyroxine  125 mcg Oral QAC breakfast  . lidocaine  1 patch Transdermal Q24H  . metoCLOPramide (REGLAN) injection  10 mg Intravenous Once  . pravastatin  40 mg Oral Daily  . risperiDONE  0.5 mg Oral Daily  . senna-docusate  1 tablet Oral BID   Continuous Infusions:   LOS: 1 day    Time spent: 35 min    Joseph Art, DO [facility] Pager [phone]  If 7PM-7AM, please contact night-coverage [email or web address] Password [email or web address] 06/19/2017, 3:01 PM

## 2017-06-19 NOTE — Plan of Care (Signed)
Pt. In low bed with pads in place.

## 2017-06-19 NOTE — Progress Notes (Signed)
Pt remains confused and refused to stand for weight, bed weight apparently was not available for accurate readings due to not being engaged prior to known need.   Will attempt weight again through out the day.

## 2017-06-19 NOTE — Progress Notes (Signed)
Patient's daughter is at bedside, PT is with patient- she is ambulating with walker; without struggles. Will medicate patient when PT is done with evaluation.   Pt's daughter states that she was able to see/speak with Dr Benjamine MolaVann upon arrival.

## 2017-06-20 DIAGNOSIS — M79605 Pain in left leg: Principal | ICD-10-CM

## 2017-06-20 LAB — CBC
HCT: 39.2 % (ref 36.0–46.0)
Hemoglobin: 12.8 g/dL (ref 12.0–15.0)
MCH: 29 pg (ref 26.0–34.0)
MCHC: 32.7 g/dL (ref 30.0–36.0)
MCV: 88.9 fL (ref 78.0–100.0)
PLATELETS: 324 10*3/uL (ref 150–400)
RBC: 4.41 MIL/uL (ref 3.87–5.11)
RDW: 13.8 % (ref 11.5–15.5)
WBC: 9 10*3/uL (ref 4.0–10.5)

## 2017-06-20 LAB — BASIC METABOLIC PANEL
ANION GAP: 6 (ref 5–15)
BUN: 19 mg/dL (ref 6–20)
CO2: 26 mmol/L (ref 22–32)
Calcium: 8.8 mg/dL — ABNORMAL LOW (ref 8.9–10.3)
Chloride: 104 mmol/L (ref 101–111)
Creatinine, Ser: 0.78 mg/dL (ref 0.44–1.00)
GFR calc Af Amer: >60 mL/min (ref >60)
Glucose, Bld: 113 mg/dL — ABNORMAL HIGH (ref 65–99)
Potassium: 3.4 mmol/L — ABNORMAL LOW (ref 3.5–5.1)
SODIUM: 136 mmol/L (ref 135–145)

## 2017-06-20 MED ORDER — DOCUSATE SODIUM 100 MG PO CAPS: 100.0000 mg | ORAL_CAPSULE | Freq: Two times a day (BID) | ORAL | 0 refills | Status: AC

## 2017-06-20 MED ORDER — DEXTROSE 5 % IV SOLN
1.0000 g | INTRAVENOUS | Status: DC
  Filled 2017-06-20: qty 10

## 2017-06-20 MED ORDER — LIDOCAINE 5 % EX PTCH: 1.0000 | MEDICATED_PATCH | CUTANEOUS | 0 refills | Status: AC

## 2017-06-20 MED ORDER — CEPHALEXIN 500 MG PO CAPS: 500.0000 mg | ORAL_CAPSULE | Freq: Two times a day (BID) | ORAL | 0 refills | Status: AC

## 2017-06-20 MED ORDER — SENNOSIDES-DOCUSATE SODIUM 8.6-50 MG PO TABS: 1.0000 | ORAL_TABLET | Freq: Every evening | ORAL | Status: AC | PRN

## 2017-06-20 MED ORDER — CEPHALEXIN 500 MG PO CAPS
500.0000 mg | ORAL_CAPSULE | Freq: Two times a day (BID) | ORAL | Status: DC
  Administered 2017-06-20: 500 mg via ORAL
  Filled 2017-06-20: qty 1

## 2017-06-20 NOTE — NC FL2 (Signed)
[place] MEDICAID FL2 LEVEL OF CARE SCREENING TOOL     IDENTIFICATION  Patient Name: Wanda Robinson Birthdate: Oct 02, 1928 Sex: female Admission Date (Current Location): 06/16/2017  [facility] and [place] Number:  Producer, television/film/video and Address:  [facility], [address] [place], [place] [address]      Provider Number: [id number]  Attending Physician Name and Address:  Joseph Art, DO  Relative Name and Phone Number:       Current Level of Care: Hospital Recommended Level of Care: Assisted Living Facility(with PT) Prior Approval Number:    Date Approved/Denied:   PASRR Number:    Discharge Plan: Other (Comment)(ALF with PT)    Current Diagnoses: Patient Active Problem List   Diagnosis Date Noted  . Acute urinary retention 06/17/2017  . Elevated troponin 06/17/2017  . Weakness 06/16/2017  . Left leg pain 06/16/2017  . Altered mental status 04/11/2017  . Acute blood loss anemia 06/05/2013  . Dementia 06/02/2013  . Cellulitis 05/16/2013  . Fever 05/16/2013  . Bradycardia 05/12/2013  . Murmur, cardiac 03/31/2013  . Gastric outlet obstruction 03/28/2013  . Hypokalemia 03/28/2013  . OSTEOPOROSIS NEC 01/15/2007  . Hypothyroidism 12/12/2006  . HYPERLIPIDEMIA 12/12/2006  . Essential hypertension 12/12/2006  . Coronary atherosclerosis 12/12/2006  . ASTHMA 12/12/2006  . COPD 12/12/2006  . GERD 12/12/2006    Orientation RESPIRATION BLADDER Height & Weight     Self  Normal   Weight: 121 lb 11.1 oz (55.2 kg) Height:  5\' 2"  (157.5 cm)  BEHAVIORAL SYMPTOMS/MOOD NEUROLOGICAL BOWEL NUTRITION STATUS  (Restless, Cooperative.) (Dementia) Continent Diet(No added salt)  AMBULATORY STATUS COMMUNICATION OF NEEDS Skin   Limited Assist Verbally Bruising                       Personal Care Assistance Level of Assistance              Functional Limitations Info  Sight, Hearing, Speech Sight Info: Adequate Hearing Info:  Adequate Speech Info: Adequate    SPECIAL CARE FACTORS FREQUENCY  PT (By licensed PT), Blood pressure     PT Frequency: 3 x week              Contractures Contractures Info: Not present    Additional Factors Info  Code Status, Allergies Code Status Info: DNR Allergies Info: Codeine Phosphate           Current Medications (06/20/2017):  This is the current hospital active medication list Current Facility-Administered Medications  Medication Dose Route Frequency Provider Last Rate Last Dose  . acetaminophen (TYLENOL) tablet 650 mg  650 mg Oral Q6H PRN Clydie Braun, MD       Or  . acetaminophen (TYLENOL) suppository 650 mg  650 mg Rectal Q6H PRN Smith, Rondell A, MD      . albuterol (PROVENTIL) (2.5 MG/3ML) 0.083% nebulizer solution 2.5 mg  2.5 mg Nebulization Q4H PRN Smith, Rondell A, MD      . amLODipine (NORVASC) tablet 10 mg  10 mg Oral Q breakfast Madelyn Flavors A, MD   10 mg at 06/20/17 0902  . aspirin EC tablet 81 mg  81 mg Oral Daily Madelyn Flavors A, MD   81 mg at 06/20/17 0902  . bisacodyl (DULCOLAX) suppository 10 mg  10 mg Rectal Daily PRN Marlin Canary U, DO      . cephALEXin (KEFLEX) capsule 500 mg  500 mg Oral Q12H Vann, Jessica U, DO      .  levothyroxine (SYNTHROID, LEVOTHROID) tablet 125 mcg  125 mcg Oral QAC breakfast Madelyn FlavorsSmith, Rondell A, MD   125 mcg at 06/20/17 [id number]  . lidocaine (LIDODERM) 5 % 1 patch  1 patch Transdermal Q24H Vann, Jessica U, DO   1 patch at 06/17/17 1120  . metoCLOPramide (REGLAN) injection 10 mg  10 mg Intravenous Once Linwood DibblesKnapp, Jon, MD   Stopped at 06/16/17 1959  . pravastatin (PRAVACHOL) tablet 40 mg  40 mg Oral Daily Madelyn FlavorsSmith, Rondell A, MD   40 mg at 06/19/17 1825  . risperiDONE (RISPERDAL M-TABS) disintegrating tablet 0.5 mg  0.5 mg Oral Daily Vann, Jessica U, DO      . senna-docusate (Senokot-S) tablet 1 tablet  1 tablet Oral QHS PRN Joseph ArtVann, Jessica U, DO      . traMADol (ULTRAM) tablet 50 mg  50 mg Oral Q12H PRN Joseph ArtVann, Jessica U, DO          Discharge Medications: STOP taking these medications   hydrochlorothiazide 25 MG tablet Commonly known as:  HYDRODIURIL   LORazepam 0.5 MG tablet Commonly known as:  ATIVAN   ondansetron 4 MG tablet Commonly known as:  ZOFRAN   polyethylene glycol packet Commonly known as:  MIRALAX / GLYCOLAX     TAKE these medications   acetaminophen 325 MG tablet Commonly known as:  TYLENOL Take 650 mg by mouth See admin instructions. 650mg  by mouth once daily - May take an addition 650mg  by mouth every 6 hours as needed for pain   amLODipine 10 MG tablet Commonly known as:  NORVASC Take 10 mg by mouth daily with breakfast.   aspirin EC 81 MG tablet Take 81 mg by mouth daily.   cephALEXin 500 MG capsule Commonly known as:  KEFLEX Take 1 capsule (500 mg total) by mouth every 12 (twelve) hours.   docusate sodium 100 MG capsule Commonly known as:  COLACE Take 1 capsule (100 mg total) by mouth 2 (two) times daily.   levothyroxine 137 MCG tablet Commonly known as:  SYNTHROID, LEVOTHROID Take 137 mcg by mouth daily before breakfast.   lidocaine 5 % Commonly known as:  LIDODERM Place 1 patch onto the skin daily. Remove & Discard patch within 12 hours or as directed by MD   pravastatin 80 MG tablet Commonly known as:  PRAVACHOL Take 80 mg by mouth daily.   senna-docusate 8.6-50 MG tablet Commonly known as:  Senokot-S Take 1 tablet by mouth at bedtime as needed for mild constipation (if patient has not had BM that day).      Relevant Imaging Results:  Relevant Lab Results:   Additional Information SS#: [id number]  Margarito LinerSarah C Demetruis Depaul, LCSW

## 2017-06-20 NOTE — Progress Notes (Signed)
HHC provided by [facility] at the family request; they will bring in a bladder scan for bladder scan checks; B Shelba Flakehandler RN,MHA,BSN [phone]

## 2017-06-20 NOTE — Progress Notes (Signed)
Patient walked to the bathroom and attempted to void, but unable.  Will try again later.

## 2017-06-20 NOTE — Discharge Summary (Signed)
Physician Discharge Summary  WALTA BELLVILLE ZOX:[id number] DOB: 07-Sep-1928 DOA: 06/16/2017  PCP: Bailey Mech, PA-C  Admit date: 06/16/2017 Discharge date: 06/20/2017   Recommendations for Outpatient Follow-Up:   1. Home health RN to manage bladder/PT 2. Close urology follow up 3. Bowel regimen to avoid constipation 4. Avoid benzos and other medications on the BEERS list or meds that can cause urinary retention 5. BMP 1 week to ensure no kidney issues   Discharge Diagnosis:   Active Problems:   Hypothyroidism   Essential hypertension   Dementia   Left leg pain   Acute urinary retention   Elevated troponin   Discharge disposition:  Home  Discharge Condition: Improved.  Diet recommendation: Low sodium, heart healthy  Wound care: None.   History of Present Illness:   Wanda Robinson is a 84 y.o. female with medical history significant of dementia, carotid stenosis, CAD, AS,andhypothyroidism;who initially presented with complaints of left leg pain and inability to walk.  History is obtained from the patient's family members who are present at bedside as patient has dementia and is unable to give her own.  Patient resides at [facility] assisted living facility and normally able to ambulate without need of assistance.  However since yesterday patient has been unable to bear weight on the left leg.  X-rays performed showed no acute signs of a fracture.  Family notes that the patient chronically has issues with back pain.  Family notes the last known fall to occurred 1 month ago for which she was evaluated and noted to have no acute fractures at that time.  Other associated symptoms included complaints of nausea and intermittent episodes of sharp abdominal pain lasting 20-30 minutes.  Denies any specific complaints of chest pain, vomiting, leg swelling, shortness of breath, or diarrhea.     Hospital Course by Problem:   Lower back pain/hip pain:Acute.   -X-rays of the left hip showed no acute signs of fracture.CT scan did reveal signs of a chronic sacral fracture on the right side which does not appear would correlate with patient's symptoms.  -pain much improved -walking with PT -apply lidocaine patch  Constipation -good response to suppository -bowel regimen to avoid constipation  Urinary retention:Acute. with bladder wall hemorrhage after foley placement -patient has pulled out 2 foleys -voiding on own but has some PVR -will treat with 3 days of keflex-- bacteria in urine but no culture sent -will need close urology follow up for bladder training-- do not think patient would tolerate catheters  Suspected dehydration: -resolved  Elevated troponin:Chronic.  -flat  Abnormal EKG with intermittent block -with dementia, would not be a candidate for intervention -appears to be chronic  Dementia:Family reports patient has been taking Ativan at night to help with anxiety related with dementia. -had falls after ativan started-- will d/c -no issue with behavior here in hospital once acclimated  Essential hypertension -Continue amlodipine   Hypothyroidism -Continue levothyroxine  Stenosis of the left subclavian artery:Chronic. Likely cause of patient's low blood pressures on left arm.  Infrarenal WJX:BJYNWGN. Seen to be 3.3 cm on CT angiogram. -Continue recommended outpatient imaging  Hyperlipidemia -Continue pravastatin  Hypokalemia -replete      Medical Consultants:    Urology (phone)   Discharge Exam:   Vitals:   06/20/17 0508 06/20/17 0901  BP: 92/70 117/74  Pulse: 70 64  Resp: 18   Temp: 97.9 F (36.6 C)   SpO2: 98%    Vitals:   06/19/17 1204 06/19/17 2005  06/20/17 0508 06/20/17 0901  BP: (!) 152/48 131/87 92/70 117/74  Pulse: (!) 54 79 70 64  Resp: 18 18 18    Temp: 97.8 F (36.6 C) 97.9 F (36.6 C) 97.9 F (36.6 C)   TempSrc: Oral Oral Oral   SpO2: 100% 98%  98%   Weight:   55.2 kg (121 lb 11.1 oz)   Height:        Gen:  NAD   The results of significant diagnostics from this hospitalization (including imaging, microbiology, ancillary and laboratory) are listed below for reference.     Procedures and Diagnostic Studies:   Dg Chest 2 View  Result Date: 06/16/2017 CLINICAL DATA:  Dementia.  Posterior hip pain. EXAM: CHEST  2 VIEW COMPARISON:  May 15, 2017 FINDINGS: The heart size and mediastinal contours are stable. The heart size is enlarged. Both lungs are clear. The visualized skeletal structures are stable. IMPRESSION: No active cardiopulmonary disease. Electronically Signed   By: Sherian ReinWei-Chen  Lin M.D.   On: 06/16/2017 16:37   Ct Angio Chest/abd/pel For Dissection W And/or Wo Contrast  Result Date: 06/16/2017 CLINICAL DATA:  Chest and back pain.  Weak left radial pulse. EXAM: CT ANGIOGRAPHY CHEST, ABDOMEN AND PELVIS TECHNIQUE: Multidetector CT imaging through the chest, abdomen and pelvis was performed using the standard protocol during bolus administration of intravenous contrast. Multiplanar reconstructed images and MIPs were obtained and reviewed to evaluate the vascular anatomy. CONTRAST:  100mL ISOVUE-370 IOPAMIDOL (ISOVUE-370) INJECTION 76% COMPARISON:  Chest radiograph from earlier today. 05/16/2013 chest CT. 04/11/2017 CT abdomen/pelvis. FINDINGS: CTA CHEST FINDINGS Cardiovascular: Normal heart size. No significant pericardial fluid/thickening. Left main and 3 vessel coronary atherosclerosis status post CABG. Atherosclerotic nonaneurysmal thoracic aorta. No acute intramural hematoma,, dissection, pseudoaneurysm or penetrating atherosclerotic ulcer in the thoracic aorta. High-grade atherosclerotic stenosis versus occlusion of the proximal left subclavian artery. Normal caliber pulmonary arteries. No central pulmonary emboli. Mediastinum/Nodes: No discrete thyroid nodules. Unremarkable esophagus. No pathologically enlarged axillary,  mediastinal or hilar lymph nodes. Lungs/Pleura: No pneumothorax. No pleural effusion. No acute consolidative airspace disease or lung masses. Mild patchy tree-in-bud opacities are noted in the basilar right upper lobe, right middle lobe, lingula and anterior right lower lobe with associated mild cylindrical and varicoid bronchiectasis. Musculoskeletal: No aggressive appearing focal osseous lesions. Intact sternotomy wires. Moderate thoracic spondylosis. Review of the MIP images confirms the above findings. CTA ABDOMEN AND PELVIS FINDINGS VASCULAR Aorta: Prominent abdominal aortic atherosclerosis. Infrarenal 3.3 cm abdominal aortic aneurysm, stable. Penetrating atherosclerotic ulcer in the right lower infrarenal abdominal aorta measuring 1.6 x 0.8 cm, stable. No acute dissection or intramural hematoma in the abdominal aorta. Celiac: At least moderate ostial stenosis.  No dissection. SMA: Moderate ostial stenosis.  No dissection. Renals: Proximal left renal artery stent is in place. Patent single left renal artery. Two right renal arteries, both with proximal renal artery stents in place, the more superior of which appears occluded proximally and the more inferior of which appears patent. No dissection. IMA: Probable high-grade ostial stenosis.  No dissection. Inflow: High-grade stenosis with occlusion in the distal right common femoral artery, unchanged. No high-grade stenosis on the left. No dissection. No aneurysm. Veins: No obvious venous abnormality within the limitations of this arterial phase study. Review of the MIP images confirms the above findings. NON-VASCULAR Hepatobiliary: Normal liver size. Simple left liver lobe cysts, largest 1.2 cm. Granulomatous left liver lobe calcification is stable. No new liver lesions. Cholecystectomy. Bile ducts are stable and within normal post cholecystectomy limits. Pancreas: Normal,  with no mass or duct dilation. Spleen: Normal size. No mass. Adrenals/Urinary Tract: Normal  adrenals. Stable asymmetric moderate right renal atrophy and scarring. No hydronephrosis. Partially exophytic simple 1.7 cm lower right renal cyst. A few tiny nonobstructing 2-3 mm stones in both kidneys. Simple 1.0 cm lower left renal cyst. Prominently distended bladder. Suggestion of mild diffuse bladder wall thickening. Moderate 3.0 cm posterior right and 3.9 cm anterior left bladder diverticulum. No focal bladder mass. Stomach/Bowel: Normal non-distended stomach. Normal caliber small bowel with no small bowel wall thickening. Appendix not discretely visualized. No pericecal inflammatory changes. Marked colonic diverticulosis, most prominent in the sigmoid colon, with no large bowel wall thickening or pericolonic fat stranding. Moderate rectal fecal distention without definite rectal wall thickening. Vascular/Lymphatic: See above for vascular findings. No pathologically enlarged lymph nodes in the abdomen or pelvis. Reproductive: Grossly normal uterus.  No adnexal mass. Other: No pneumoperitoneum, ascites or focal fluid collection. Musculoskeletal: No aggressive appearing focal osseous lesions. Asymmetric sclerosis throughout the right sacral ala compatible with chronic nondisplaced insufficiency fracture. Marked lumbar spondylosis. Review of the MIP images confirms the above findings. IMPRESSION: 1. No acute aortic syndrome.  Aortic Atherosclerosis (ICD10-I70.0). 2. High-grade atherosclerotic stenosis versus occlusion of the proximal left subclavian artery, chronic appearing. Numerous additional chronic appearing arterial findings as detailed above. 3. Stable 3.3 cm infrarenal abdominal aortic aneurysm. Recommend followup by ultrasound in 3 years. This recommendation follows ACR consensus guidelines: White Paper of the ACR Incidental Findings Committee II on Vascular Findings. J Am Coll Radiol 2013; 10:789-794. 4. 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 5. Nonobstructing bilateral nephrolithiasis. No hydronephrosis. Prominently distended bladder with moderate bladder diverticula. Recommend clinical correlation to exclude bladder outlet obstruction. 6. Marked colonic diverticulosis. 7. Chronic right sacral insufficiency fracture. Electronically Signed   By: Delbert Phenix M.D.   On: 06/16/2017 17:15   Dg Hips Bilat W Or Wo Pelvis 2 Views  Result Date: 06/16/2017 CLINICAL DATA:  Bilateral hip pain for several weeks EXAM: DG HIP (WITH OR WITHOUT PELVIS) 2V BILAT COMPARISON:  June 02, 2015 FINDINGS: There is no evidence of hip fracture or dislocation. Arthritic changes of bilateral hips are noted. IMPRESSION: No acute fracture or dislocation.Arthritic changes of bilateral hips are noted. Electronically Signed   By: Sherian Rein M.D.   On: 06/16/2017 16:39     Labs:   Basic Metabolic Panel: Recent Labs  Lab 06/16/17 1520 06/17/17 0538 06/18/17 0433 06/20/17 0527  NA 139 138 136 136  K 3.5 3.3* 3.2* 3.4*  CL 101 105 102 104  CO2 28 21* 25 26  GLUCOSE 171* 84 93 113*  BUN 30* 17 15 19   CREATININE 1.09* 0.83 0.83 0.78  CALCIUM 9.2 8.7* 8.7* 8.8*   GFR Estimated Creatinine Clearance: 39.9 mL/min (by C-G formula based on SCr of 0.78 mg/dL). Liver Function Tests: Recent Labs  Lab 06/16/17 1520  AST 20  ALT 10*  ALKPHOS 120  BILITOT 0.6  PROT 7.0  ALBUMIN 3.4*   Recent Labs  Lab 06/16/17 1520  LIPASE 27   No results for input(s): AMMONIA in the last 168 hours. Coagulation profile No results for input(s): INR, PROTIME in the last 168 hours.  CBC: Recent Labs  Lab 06/16/17 1520 06/17/17 0538 06/18/17 0433 06/20/17 0527  WBC 5.9 6.3 5.8 9.0  HGB 12.6 11.7* 11.2* 12.8  HCT 38.1 35.6* 34.2* 39.2  MCV 89.6 89.4  88.6 88.9  PLT 313 277 266 324   Cardiac Enzymes: Recent Labs  Lab 06/16/17 1520 06/16/17 1930 06/17/17 0017 06/17/17 0538   TROPONINI 0.04* 0.03* 0.03* 0.03*   BNP: Invalid input(s): POCBNP CBG: Recent Labs  Lab 06/16/17 2233  GLUCAP 136*   D-Dimer No results for input(s): DDIMER in the last 72 hours. Hgb A1c No results for input(s): HGBA1C in the last 72 hours. Lipid Profile No results for input(s): CHOL, HDL, LDLCALC, TRIG, CHOLHDL, LDLDIRECT in the last 72 hours. Thyroid function studies No results for input(s): TSH, T4TOTAL, T3FREE, THYROIDAB in the last 72 hours.  Invalid input(s): FREET3 Anemia work up No results for input(s): VITAMINB12, FOLATE, FERRITIN, TIBC, IRON, RETICCTPCT in the last 72 hours. Microbiology Recent Results (from the past 240 hour(s))  MRSA PCR Screening     Status: None   Collection Time: 06/17/17  2:50 AM  Result Value Ref Range Status   MRSA by PCR NEGATIVE NEGATIVE Final    Comment:        The GeneXpert MRSA Assay (FDA approved for NASAL specimens only), is one component of a comprehensive MRSA colonization surveillance program. It is not intended to diagnose MRSA infection nor to guide or monitor treatment for MRSA infections.      Discharge Instructions:   Discharge Instructions    Diet - low sodium heart healthy   Complete by:  As directed    Discharge instructions   Complete by:  As directed    Home health RN/PT -RN for bladder management   Increase activity slowly   Complete by:  As directed      Allergies as of 06/20/2017      Reactions   Codeine Phosphate Nausea Only      Medication List    STOP taking these medications   hydrochlorothiazide 25 MG tablet Commonly known as:  HYDRODIURIL   LORazepam 0.5 MG tablet Commonly known as:  ATIVAN   ondansetron 4 MG tablet Commonly known as:  ZOFRAN   polyethylene glycol packet Commonly known as:  MIRALAX / GLYCOLAX     TAKE these medications   acetaminophen 325 MG tablet Commonly known as:  TYLENOL Take 650 mg by mouth See admin instructions. 650mg  by mouth once daily - May take an  addition 650mg  by mouth every 6 hours as needed for pain   amLODipine 10 MG tablet Commonly known as:  NORVASC Take 10 mg by mouth daily with breakfast.   aspirin EC 81 MG tablet Take 81 mg by mouth daily.   cephALEXin 500 MG capsule Commonly known as:  KEFLEX Take 1 capsule (500 mg total) by mouth every 12 (twelve) hours.   docusate sodium 100 MG capsule Commonly known as:  COLACE Take 1 capsule (100 mg total) by mouth 2 (two) times daily.   levothyroxine 137 MCG tablet Commonly known as:  SYNTHROID, LEVOTHROID Take 137 mcg by mouth daily before breakfast.   lidocaine 5 % Commonly known as:  LIDODERM Place 1 patch onto the skin daily. Remove & Discard patch within 12 hours or as directed by MD   pravastatin 80 MG tablet Commonly known as:  PRAVACHOL Take 80 mg by mouth daily.   senna-docusate 8.6-50 MG tablet Commonly known as:  Senokot-S Take 1 tablet by mouth at bedtime as needed for mild constipation (if patient has not had BM that day).      Follow-up Information    Bailey Mech, PA-C Follow up in 1 week(s).  Specialty:  Physician Assistant Contact information: [address] [facility] [address] [phone]        [facility] Follow up.   Why:  ASAP for urinary retention Contact information: [address] [place] [address] [phone]           Time coordinating discharge: 35 min  Signed:  Joseph Art   [facility] 06/20/2017, 11:40 AM

## 2017-06-20 NOTE — Progress Notes (Signed)
Patient's daughter given discharge instructions and all questions answered.  Patient's daughter also given DNR form and [facility]  [facility] no answer, left a number for the nurse to call back for report.

## 2017-06-20 NOTE — Clinical Social Work Note (Signed)
CSW facilitated patient discharge including contacting patient family and facility to confirm patient discharge plans. Clinical information faxed to facility and family agreeable with plan. Patient's [facility]. RN to call report prior to discharge [phone]).  CSW will sign off for now as social work intervention is no longer needed. Please consult us again if new needs arise.  Charlynn CourtSarah Eitan Doubleday, CSW [phone]

## 2017-07-02 ENCOUNTER — Other Ambulatory Visit: Payer: Self-pay

## 2017-07-02 ENCOUNTER — Emergency Department (HOSPITAL_COMMUNITY)
Admission: EM | Admit: 2017-07-02 | Discharge: 2017-07-02 | Disposition: A | Discharge status: Home / Self Care | Payer: Medicare Other | Attending: Emergency Medicine | Admitting: Emergency Medicine

## 2017-07-02 ENCOUNTER — Encounter (HOSPITAL_COMMUNITY): Payer: Self-pay | Admitting: Emergency Medicine

## 2017-07-02 DIAGNOSIS — N189 Chronic kidney disease, unspecified: Secondary | ICD-10-CM | POA: Insufficient documentation

## 2017-07-02 DIAGNOSIS — Z951 Presence of aortocoronary bypass graft: Secondary | ICD-10-CM | POA: Insufficient documentation

## 2017-07-02 DIAGNOSIS — Z9049 Acquired absence of other specified parts of digestive tract: Secondary | ICD-10-CM | POA: Diagnosis not present

## 2017-07-02 DIAGNOSIS — I252 Old myocardial infarction: Secondary | ICD-10-CM | POA: Insufficient documentation

## 2017-07-02 DIAGNOSIS — Z87891 Personal history of nicotine dependence: Secondary | ICD-10-CM | POA: Insufficient documentation

## 2017-07-02 DIAGNOSIS — F0390 Unspecified dementia without behavioral disturbance: Secondary | ICD-10-CM | POA: Insufficient documentation

## 2017-07-02 DIAGNOSIS — R339 Retention of urine, unspecified: Secondary | ICD-10-CM

## 2017-07-02 DIAGNOSIS — I129 Hypertensive chronic kidney disease with stage 1 through stage 4 chronic kidney disease, or unspecified chronic kidney disease: Secondary | ICD-10-CM | POA: Insufficient documentation

## 2017-07-02 DIAGNOSIS — R310 Gross hematuria: Secondary | ICD-10-CM | POA: Insufficient documentation

## 2017-07-02 DIAGNOSIS — I2510 Atherosclerotic heart disease of native coronary artery without angina pectoris: Secondary | ICD-10-CM | POA: Diagnosis not present

## 2017-07-02 DIAGNOSIS — E039 Hypothyroidism, unspecified: Secondary | ICD-10-CM | POA: Insufficient documentation

## 2017-07-02 DIAGNOSIS — Z79899 Other long term (current) drug therapy: Secondary | ICD-10-CM | POA: Diagnosis not present

## 2017-07-02 DIAGNOSIS — M545 Low back pain: Secondary | ICD-10-CM | POA: Diagnosis present

## 2017-07-02 DIAGNOSIS — Z7982 Long term (current) use of aspirin: Secondary | ICD-10-CM | POA: Insufficient documentation

## 2017-07-02 LAB — COMPREHENSIVE METABOLIC PANEL
ALT: 11 U/L — ABNORMAL LOW (ref 14–54)
AST: 16 U/L (ref 15–41)
Albumin: 2.8 g/dL — ABNORMAL LOW (ref 3.5–5.0)
Alkaline Phosphatase: 164 U/L — ABNORMAL HIGH (ref 38–126)
Anion gap: 7 (ref 5–15)
BILIRUBIN TOTAL: 0.8 mg/dL (ref 0.3–1.2)
BUN: 22 mg/dL — ABNORMAL HIGH (ref 6–20)
CALCIUM: 8.9 mg/dL (ref 8.9–10.3)
CHLORIDE: 108 mmol/L (ref 101–111)
CO2: 24 mmol/L (ref 22–32)
Creatinine, Ser: 0.92 mg/dL (ref 0.44–1.00)
GFR, EST NON AFRICAN AMERICAN: 55 mL/min — AB (ref >60)
Glucose, Bld: 103 mg/dL — ABNORMAL HIGH (ref 65–99)
Potassium: 4.2 mmol/L (ref 3.5–5.1)
Sodium: 139 mmol/L (ref 135–145)
TOTAL PROTEIN: 5.7 g/dL — AB (ref 6.5–8.1)

## 2017-07-02 LAB — URINALYSIS, ROUTINE W REFLEX MICROSCOPIC

## 2017-07-02 LAB — CBC
HCT: 34.3 % — ABNORMAL LOW (ref 36.0–46.0)
Hemoglobin: 11 g/dL — ABNORMAL LOW (ref 12.0–15.0)
MCH: 28.9 pg (ref 26.0–34.0)
MCHC: 32.1 g/dL (ref 30.0–36.0)
MCV: 90.3 fL (ref 78.0–100.0)
PLATELETS: 265 10*3/uL (ref 150–400)
RBC: 3.8 MIL/uL — AB (ref 3.87–5.11)
RDW: 14.2 % (ref 11.5–15.5)
WBC: 9 10*3/uL (ref 4.0–10.5)

## 2017-07-02 LAB — URINALYSIS, MICROSCOPIC (REFLEX)

## 2017-07-02 LAB — POC OCCULT BLOOD, ED: Fecal Occult Bld: NEGATIVE

## 2017-07-02 MED ORDER — HEATING PAD DRY HEAT PADS: MEDICATED_PAD | 1 refills | Status: AC

## 2017-07-02 NOTE — ED Triage Notes (Signed)
Pt arrives with her daughter from nursing facility, per daughter, nursing facility stated they found patient in her bed with bloody pants. Unable to provide info to triage RN regarding whether blood was dark or bright red or when it began, no hx of gi bleed per pts daughter. Pt denies any complaints, nad.

## 2017-07-02 NOTE — ED Notes (Signed)
Patient given discharge instructions and verbalized understanding.  Patient stable to discharge at this time.  Patient is alert and oriented to baseline.  No distressed noted at this time.  All belongings taken with the patient at discharge.   

## 2017-07-02 NOTE — Discharge Instructions (Signed)
The bleeding found today is from the urine, not the stool.  The bleeding is likely related to the recent trauma from Foley removal.  At this point there is no reason for further treatment.  Her bleeding should gradually improve, but may require some intermittent catheterization if she has urinary retention.  If there is access to a bladder scanner, a recommended time for catheterization to remove urine is at 300 cc.  Otherwise she could be catheterized, if she has lower abdominal pain with a mass above the pubic bone.  If she develops confusion, weakness, low blood pressure or increased pain that cannot be managed, return for evaluation.  Call the urologist for an outpatient appointment for follow-up care as needed.  Have her PCP recheck her blood next week for follow-up on the hemoglobin level which was 11.0 today.  Please use a heating pad on sore areas of hip or back to help the discomfort.

## 2017-07-02 NOTE — ED Provider Notes (Signed)
[facility] EMERGENCY DEPARTMENT Provider Note   CSN: [id number] Arrival date & time: 07/02/17  1010     History   Chief Complaint Chief Complaint  Patient presents with  . GI Bleeding    HPI Wanda Robinson is a 84 y.o. female.\  She was sent here from her nursing care facility because of blood in the diaper, noticed today.  Recently hospitalized, discharged on 06/20/17, she had been admitted for further evaluation after she presented with low back pain.  Her daughter believes that she had left hip problem causing the discomfort.  The admission was somewhat complicated, proceed with CT imaging of the abdomen, had finding of urinary retention, cause unclear, had hematuria following accidental removal of Foley catheter by patient, and was treated with antibiotics for prevention of UTI.  Cause of urinary retention was not found, although she was noted to be constipated on initial CT imaging.  Daughter reports she has chronic low back pain.  For the last couple of weeks she has had to walk using a walker.  She is undergoing physical therapy at her facility.  Level 5 caveat-dementia  HPI  Past Medical History:  Diagnosis Date  . Anemia    past hx. 2011  . Aortic stenosis   . Back pain   . CAD (coronary artery disease)   . Carotid artery occlusion   . Chronic kidney disease 05-05-13   renal dysfunction- has improved from 3'11  . Colon polyps    removed with colonoscopy  . COPD (chronic obstructive pulmonary disease) (HCC)   . Dementia 05-05-13   short term memory issues"no dx"  . Gastric outlet obstruction    10'14- hospital stay of several days([facility])  . Hiatal hernia   . Hyperlipidemia   . Hypertension   . Hypothyroidism   . MI (myocardial infarction) (HCC)   . Renal artery stenosis (HCC)    Previous stenting of renal arteries bilaterally  . Renal insufficiency   . Ulcer     Patient Active Problem List   Diagnosis Date Noted  . Acute urinary retention  06/17/2017  . Elevated troponin 06/17/2017  . Weakness 06/16/2017  . Left leg pain 06/16/2017  . Altered mental status 04/11/2017  . Acute blood loss anemia 06/05/2013  . Dementia 06/02/2013  . Cellulitis 05/16/2013  . Fever 05/16/2013  . Bradycardia 05/12/2013  . Murmur, cardiac 03/31/2013  . Gastric outlet obstruction 03/28/2013  . Hypokalemia 03/28/2013  . OSTEOPOROSIS NEC 01/15/2007  . Hypothyroidism 12/12/2006  . HYPERLIPIDEMIA 12/12/2006  . Essential hypertension 12/12/2006  . Coronary atherosclerosis 12/12/2006  . ASTHMA 12/12/2006  . COPD 12/12/2006  . GERD 12/12/2006    Past Surgical History:  Procedure Laterality Date  . CAROTID ENDARTERECTOMY Left 2011   Dr. Darrick Penna  . CHOLECYSTECTOMY     open  . CORONARY ARTERY BYPASS GRAFT  2002   Dr Dorris Fetch  . GASTROSTOMY N/A 05/12/2013   Procedure: G TUBE PLACEMENT;  Surgeon: Axel Filler, MD;  Location: [facility];  Service: General;  Laterality: N/A;  . HIATAL HERNIA REPAIR N/A 05/12/2013   Procedure: LAPAROSCOPIC REPAIR OF HIATAL HERNIA, ;  Surgeon: Axel Filler, MD;  Location: [facility];  Service: General;  Laterality: N/A;  . INSERTION OF MESH N/A 05/12/2013   Procedure: INSERTION OF MESH;  Surgeon: Axel Filler, MD;  Location: [facility];  Service: General;  Laterality: N/A;    OB History    No data available       Home Medications  Prior to Admission medications   Medication Sig Start Date End Date Taking? Authorizing Provider  acetaminophen (TYLENOL) 325 MG tablet Take 650 mg by mouth See admin instructions. 650mg  by mouth once daily - May take an addition 650mg  by mouth every 6 hours as needed for pain   Yes [provider]  amLODipine (NORVASC) 10 MG tablet Take 10 mg by mouth daily with breakfast.    Yes [provider]  aspirin EC 81 MG tablet Take 81 mg by mouth daily.   Yes [provider]  cephALEXin (KEFLEX) 500 MG capsule Take 1 capsule (500 mg total) by mouth every 12  (twelve) hours. Patient taking differently: Take 500 mg by mouth 2 (two) times daily.  06/20/17  Yes Marlin Canary U, DO  docusate sodium (COLACE) 100 MG capsule Take 1 capsule (100 mg total) by mouth 2 (two) times daily. 06/20/17 06/20/18 Yes Vann, Selinda Orion, DO  levothyroxine (SYNTHROID, LEVOTHROID) 137 MCG tablet Take 137 mcg by mouth daily before breakfast.   Yes [provider]  lidocaine (LIDODERM) 5 % Place 1 patch onto the skin daily. Remove & Discard patch within 12 hours or as directed by MD 06/20/17  Yes Marlin Canary U, DO  pravastatin (PRAVACHOL) 80 MG tablet Take 80 mg by mouth at bedtime.    Yes [provider]  promethazine (PHENERGAN) 25 MG tablet Take 25 mg by mouth every 12 (twelve) hours as needed for nausea or vomiting.   Yes [provider]  senna-docusate (SENOKOT-S) 8.6-50 MG tablet Take 1 tablet by mouth at bedtime as needed for mild constipation (if patient has not had BM that day). 06/20/17  Yes Vann, Jessica U, DO  Heating Pads (HEATING PAD DRY HEAT) PADS Use for 30 minutes 3 or 4 times a day as needed for pain in the back or hips. 07/02/17   Mancel Bale, MD    Family History Family History  Problem Relation Age of Onset  . Heart disease Father        CAD  . Heart attack Father   . Hypertension Father   . Cancer Mother   . Cancer Sister   . Heart disease Brother        before age 108  . Heart attack Brother   . Hypertension Brother   . Cancer Son   . Hypertension Son     Social History Social History   Tobacco Use  . Smoking status: Former Smoker    Last attempt to quit: 05/05/1989    Years since quitting: 28.1  . Smokeless tobacco: Never Used  Substance Use Topics  . Alcohol use: No    Alcohol/week: 0.0 oz  . Drug use: No     Allergies   Codeine phosphate   Review of Systems Review of Systems  Unable to perform ROS: Dementia     Physical Exam Updated Vital Signs BP (!) 132/54   Pulse 63   Temp 97.8 F (36.6 C)  (Oral)   Resp 18   SpO2 100%   Physical Exam  Constitutional: She appears well-developed.  Elderly, frail  HENT:  Head: Normocephalic and atraumatic.  Eyes: Conjunctivae and EOM are normal. Pupils are equal, round, and reactive to light.  Neck: Normal range of motion and phonation normal. Neck supple.  Cardiovascular: Normal rate and regular rhythm.  Pulmonary/Chest: Effort normal and breath sounds normal. She exhibits no tenderness.  Abdominal: Soft. She exhibits no distension. There is tenderness (Suprapubic, mild, without palpable mass.).  There is no guarding.  Genitourinary:  Genitourinary Comments: Brown stool in rectal vault.  Musculoskeletal: Normal range of motion.  Normal active and passive range of motion of both hips.  Neurological: She is alert. She exhibits normal muscle tone.  Skin: Skin is warm and dry.  Psychiatric: She has a normal mood and affect. Her behavior is normal.  Nursing note and vitals reviewed.    ED Treatments / Results  Labs (all labs ordered are listed, but only abnormal results are displayed) Labs Reviewed  COMPREHENSIVE METABOLIC PANEL - Abnormal; Notable for the following components:      Result Value   Glucose, Bld 103 (*)    BUN 22 (*)    Total Protein 5.7 (*)    Albumin 2.8 (*)    ALT 11 (*)    Alkaline Phosphatase 164 (*)    GFR calc non Af Amer 55 (*)    All other components within normal limits  CBC - Abnormal; Notable for the following components:   RBC 3.80 (*)    Hemoglobin 11.0 (*)    HCT 34.3 (*)    All other components within normal limits  URINALYSIS, ROUTINE W REFLEX MICROSCOPIC - Abnormal; Notable for the following components:   Color, Urine RED (*)    APPearance TURBID (*)    Glucose, UA   (*)    Value: TEST NOT REPORTED DUE TO COLOR INTERFERENCE OF URINE PIGMENT   Hgb urine dipstick   (*)    Value: TEST NOT REPORTED DUE TO COLOR INTERFERENCE OF URINE PIGMENT   Bilirubin Urine   (*)    Value: TEST NOT REPORTED DUE  TO COLOR INTERFERENCE OF URINE PIGMENT   Ketones, ur   (*)    Value: TEST NOT REPORTED DUE TO COLOR INTERFERENCE OF URINE PIGMENT   Protein, ur   (*)    Value: TEST NOT REPORTED DUE TO COLOR INTERFERENCE OF URINE PIGMENT   Nitrite   (*)    Value: TEST NOT REPORTED DUE TO COLOR INTERFERENCE OF URINE PIGMENT   Leukocytes, UA   (*)    Value: TEST NOT REPORTED DUE TO COLOR INTERFERENCE OF URINE PIGMENT   All other components within normal limits  URINALYSIS, MICROSCOPIC (REFLEX) - Abnormal; Notable for the following components:   Bacteria, UA MANY (*)    Squamous Epithelial / LPF 0-5 (*)    All other components within normal limits  POC OCCULT BLOOD, ED  TYPE AND SCREEN   Hemoglobin  Date Value Ref Range Status  07/02/2017 11.0 (L) 12.0 - 15.0 g/dL Final  16/10/960401/08/2017 54.012.8 12.0 - 15.0 g/dL Final  98/11/914701/06/2017 82.911.2 (L) 12.0 - 15.0 g/dL Final  56/21/308612/31/2018 57.811.7 (L) 12.0 - 15.0 g/dL Final     EKG  EKG Interpretation None       Radiology No results found.  Procedures Procedures (including critical care time)  Medications Ordered in ED Medications - No data to display   Initial Impression / Assessment and Plan / ED Course  I have reviewed the triage vital signs and the nursing notes.  Pertinent labs & imaging results that were available during my care of the patient were reviewed by me and considered in my medical decision making (see chart for details).  Clinical Course as of Jul 02 1437  Tue Jul 02, 2017  1349 Bladder scan positive for urinary retention with 307 mL of urine in the bladder.  In and Out catheter drainage ordered.  [EW]  1423 In  and Out cath, recovered 300 cc bloody urine.  [EW]  1424 Case discussed with urology, who after learning details of the case agree that no instrumentation, or intervention is required at this time.  Patient would be at risk for further damage if she had another catheter placed.  She is hemodynamically stable at this time.  [EW]      Clinical Course User Index [EW] Mancel Bale, MD     Patient Vitals for the past 24 hrs:  BP Temp Temp src Pulse Resp SpO2  07/02/17 1300 (!) 132/54 - - 63 18 100 %  07/02/17 1245 (!) 161/49 - - - 16 -  07/02/17 1230 (!) 143/47 - - (!) 56 14 99 %  07/02/17 1108 (!) 137/55 97.8 F (36.6 C) Oral 69 18 100 %    2:25 PM Reevaluation with update and discussion. After initial assessment and treatment, an updated evaluation reveals patient remains comfortable.  She has no further complaints.  Findings discussed with patient's daughter, who informed me that the patient can get intermittent catheterization at her assisted living facility if needed.  Details of the case, and management were discussed with the daughter and she is agreeable with the plan.  All questions answered. Mancel Bale      Final Clinical Impressions(s) / ED Diagnoses   Final diagnoses:  Gross hematuria  Urinary retention    Hematuria, hemodynamically stable.  Bleeding likely secondary to traumatic removal of catheter, by patient during hospitalization.  Hemoglobin 11.0, which is somewhat lower than recent multiple hemoglobin testing results.  No indication for hospitalization, or initiation of medications, to treat the hematuria at this time.  Doubt UTI.  Doubt metabolic instability or impending vascular collapse.  Nursing Notes Reviewed/ Care Coordinated Applicable Imaging Reviewed Interpretation of Laboratory Data incorporated into ED treatment  The patient appears reasonably screened and/or stabilized for discharge and I doubt any other medical condition or other [facility] requiring further screening, evaluation, or treatment in the ED at this time prior to discharge.  Plan: Home Medications-continue usual medications; heat to sore areas, as needed, Home Treatments-intermittent catheterization as needed urine greater than 300 cc in bladder; return here if the recommended treatment, does not improve the symptoms;  Recommended follow up-urology follow-up 1-2 weeks.  PCP follow-up 1 week for repeat hemoglobin testing.   ED Discharge Orders        Ordered    Heating Pads (HEATING PAD DRY HEAT) PADS     07/02/17 1436       Mancel Bale, MD 07/02/17 1440

## 2017-07-02 NOTE — ED Notes (Signed)
Did in and out cath on patient to get urine patient is now resting with family at bedside and call bell in reach

## 2017-07-03 LAB — TYPE AND SCREEN
ABO/RH(D): O POS
Antibody Screen: POSITIVE
Unit division: 0
Unit division: 0

## 2017-07-03 LAB — BPAM RBC
BLOOD PRODUCT EXPIRATION DATE: 201902142359
BLOOD PRODUCT EXPIRATION DATE: 201902152359
UNIT TYPE AND RH: 5100
Unit Type and Rh: 5100

## 2018-01-16 DEATH — deceased

## 2019-05-22 IMAGING — DX DG HIP (WITH OR WITHOUT PELVIS) 2V BILAT
5 series · 5 of 5 positions shown · non-contrast
Comparison: June 02, 2015

CLINICAL DATA: Bilateral hip pain for several weeks

EXAM:
DG HIP (WITH OR WITHOUT PELVIS) 2V BILAT

[pelvis ap]
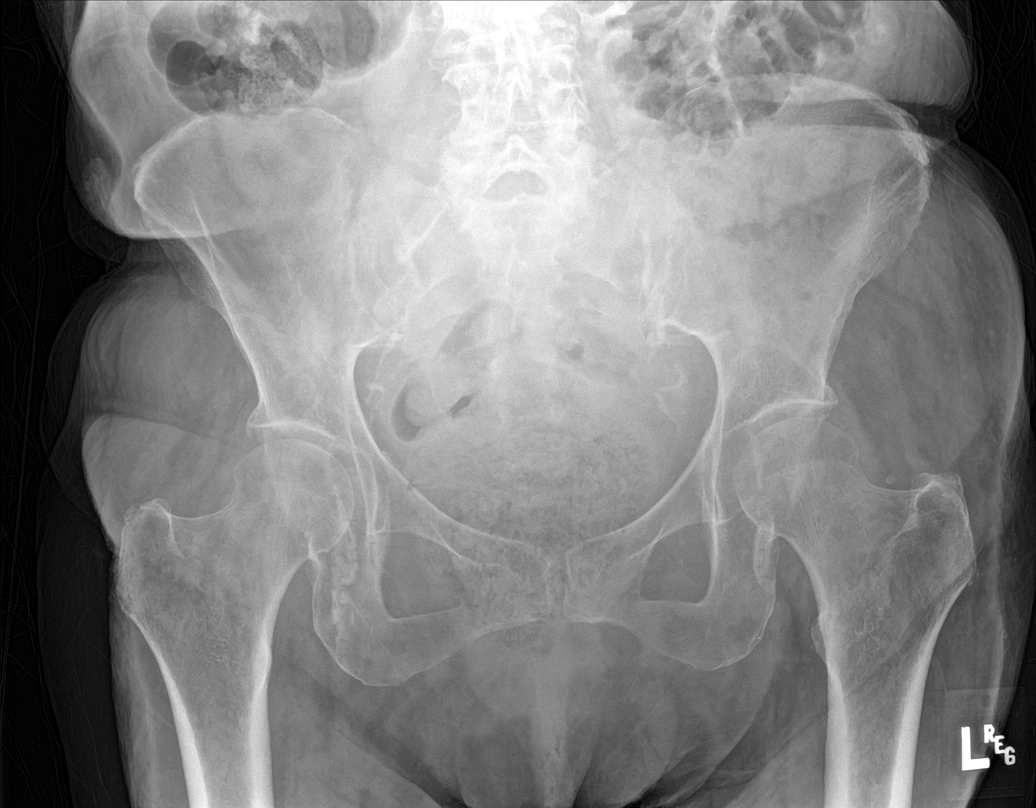

[hip ap (1 of 2)]
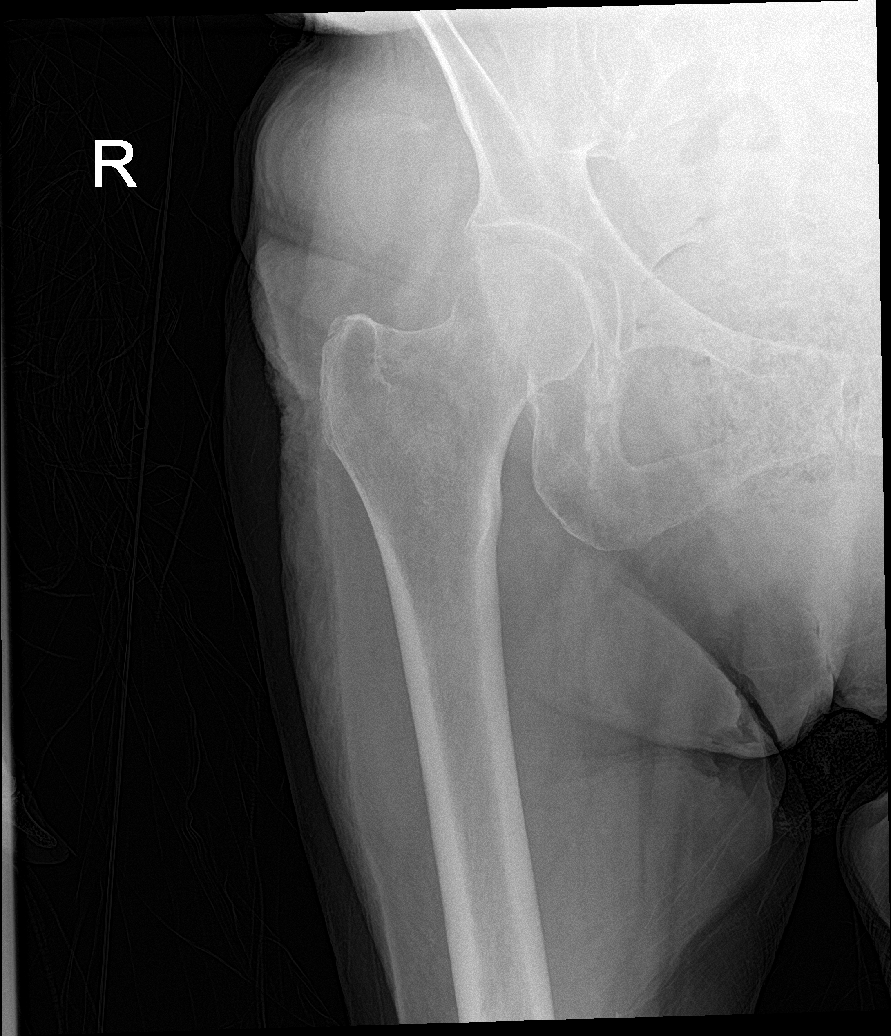

[hip lat (1 of 2)]
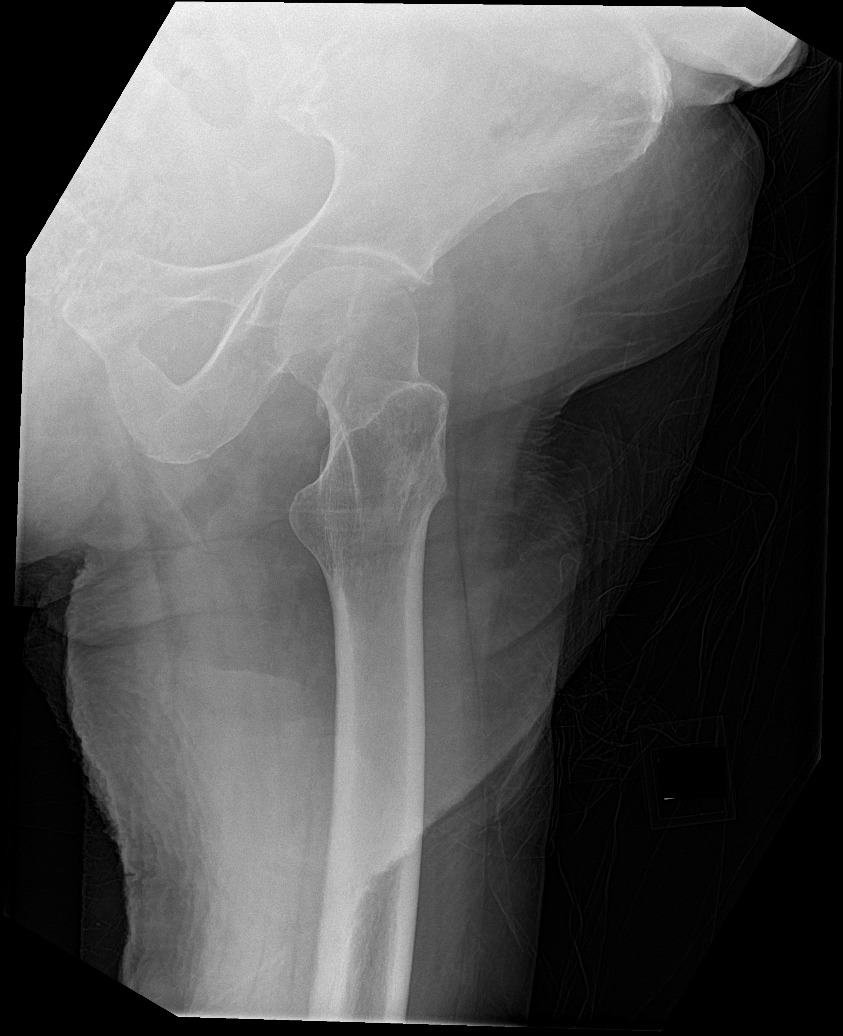

[hip ap (2 of 2)]
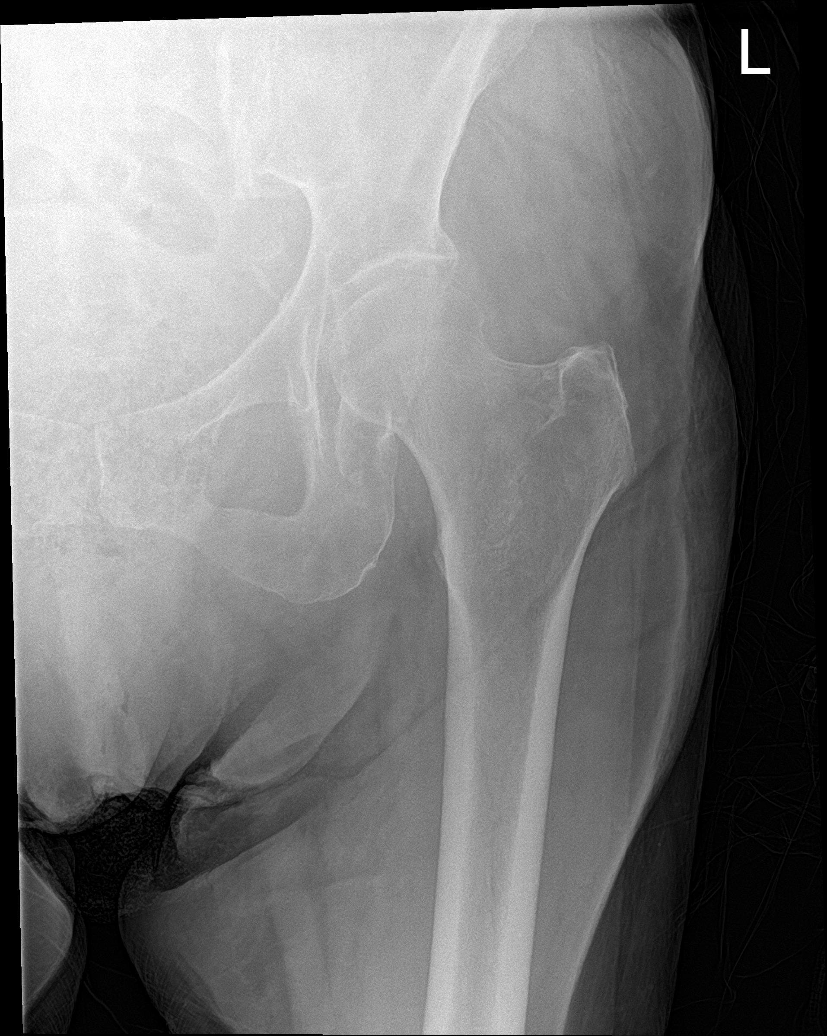

[hip lat (2 of 2)]
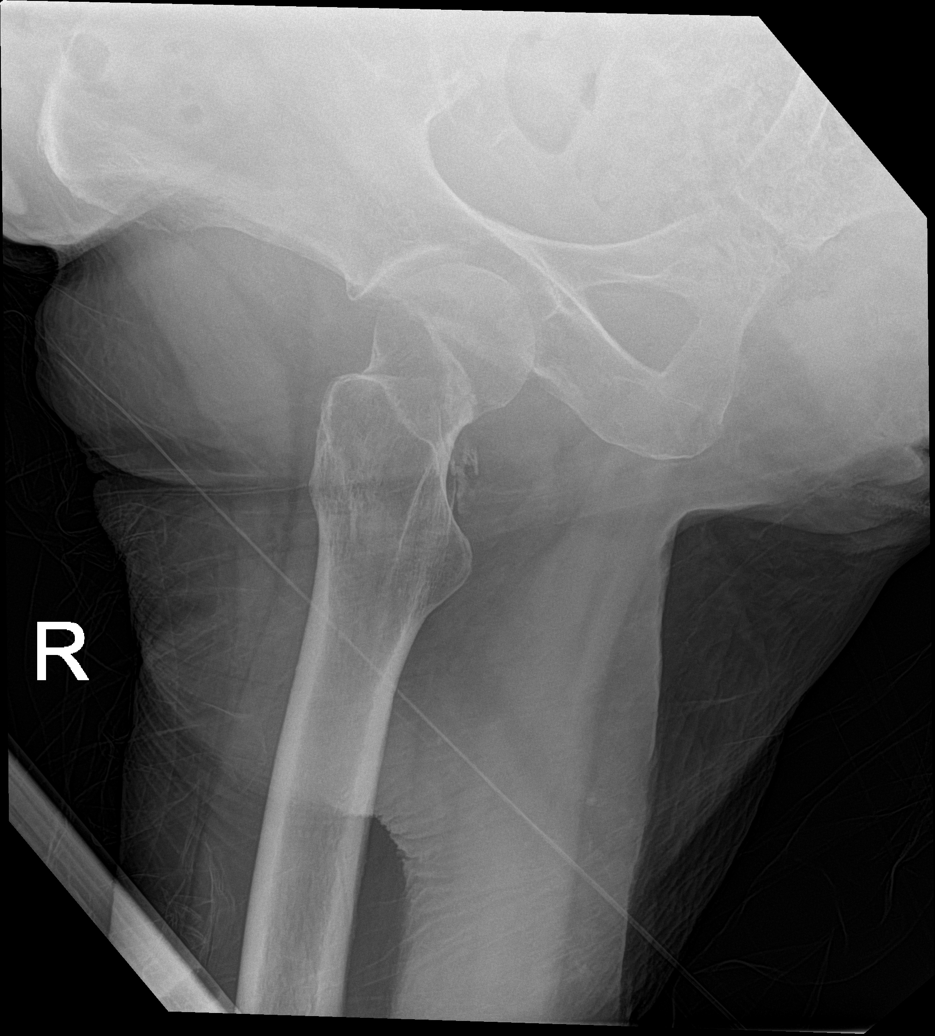

[5 of 5 positions shown; findings below may reference images not displayed]

FINDINGS: There is no evidence of hip fracture or dislocation. Arthritic
changes of bilateral hips are noted.
IMPRESSION: No acute fracture or dislocation.Arthritic changes of bilateral hips
are noted.
